# Patient Record
Sex: Male | Born: 1945
Health system: Southern US, Community
[De-identification: ages and names within clinical notes are randomized; demographics above are authoritative.]

## PROBLEM LIST (undated history)

## (undated) ENCOUNTER — Emergency Department (HOSPITAL_COMMUNITY): Payer: Medicare HMO | Source: Home / Self Care

## (undated) DIAGNOSIS — N138 Other obstructive and reflux uropathy: Secondary | ICD-10-CM

## (undated) DIAGNOSIS — I739 Peripheral vascular disease, unspecified: Secondary | ICD-10-CM

## (undated) DIAGNOSIS — K219 Gastro-esophageal reflux disease without esophagitis: Secondary | ICD-10-CM

## (undated) DIAGNOSIS — R0609 Other forms of dyspnea: Secondary | ICD-10-CM

## (undated) DIAGNOSIS — R634 Abnormal weight loss: Secondary | ICD-10-CM

## (undated) DIAGNOSIS — F17200 Nicotine dependence, unspecified, uncomplicated: Secondary | ICD-10-CM

## (undated) DIAGNOSIS — E7800 Pure hypercholesterolemia, unspecified: Secondary | ICD-10-CM

## (undated) DIAGNOSIS — J449 Chronic obstructive pulmonary disease, unspecified: Secondary | ICD-10-CM

## (undated) DIAGNOSIS — I10 Essential (primary) hypertension: Secondary | ICD-10-CM

## (undated) DIAGNOSIS — I77811 Abdominal aortic ectasia: Secondary | ICD-10-CM

## (undated) DIAGNOSIS — I2510 Atherosclerotic heart disease of native coronary artery without angina pectoris: Secondary | ICD-10-CM

## (undated) DIAGNOSIS — R053 Chronic cough: Secondary | ICD-10-CM

## (undated) DIAGNOSIS — R0600 Dyspnea, unspecified: Secondary | ICD-10-CM

## (undated) DIAGNOSIS — N401 Enlarged prostate with lower urinary tract symptoms: Secondary | ICD-10-CM

## (undated) DIAGNOSIS — R05 Cough: Secondary | ICD-10-CM

## (undated) HISTORY — DX: Other obstructive and reflux uropathy: N13.8

## (undated) HISTORY — DX: Peripheral vascular disease, unspecified: I73.9

## (undated) HISTORY — DX: Chronic cough: R05.3

## (undated) HISTORY — DX: Nicotine dependence, unspecified, uncomplicated: F17.200

## (undated) HISTORY — DX: Other forms of dyspnea: R06.09

## (undated) HISTORY — DX: Chronic obstructive pulmonary disease, unspecified: J44.9

## (undated) HISTORY — DX: Gastro-esophageal reflux disease without esophagitis: K21.9

## (undated) HISTORY — PX: CORONARY ANGIOPLASTY WITH STENT PLACEMENT: SHX49

## (undated) HISTORY — DX: Benign prostatic hyperplasia with lower urinary tract symptoms: N40.1

## (undated) HISTORY — DX: Dyspnea, unspecified: R06.00

## (undated) HISTORY — PX: FOOT SURGERY: SHX648

## (undated) HISTORY — DX: Pure hypercholesterolemia, unspecified: E78.00

## (undated) HISTORY — PX: ROTATOR CUFF REPAIR: SHX139

---
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
## 2009-07-30 DIAGNOSIS — N529 Male erectile dysfunction, unspecified: Secondary | ICD-10-CM | POA: Insufficient documentation

## 2009-07-30 HISTORY — DX: Male erectile dysfunction, unspecified: N52.9

## 2016-01-13 ENCOUNTER — Emergency Department (HOSPITAL_BASED_OUTPATIENT_CLINIC_OR_DEPARTMENT_OTHER): Payer: Medicare Other

## 2016-01-13 ENCOUNTER — Encounter (HOSPITAL_BASED_OUTPATIENT_CLINIC_OR_DEPARTMENT_OTHER): Payer: Self-pay | Admitting: Emergency Medicine

## 2016-01-13 ENCOUNTER — Emergency Department (HOSPITAL_BASED_OUTPATIENT_CLINIC_OR_DEPARTMENT_OTHER)
Admission: EM | Admit: 2016-01-13 | Discharge: 2016-01-13 | Disposition: A | Discharge status: Home / Self Care | Payer: Medicare Other | Attending: Emergency Medicine | Admitting: Emergency Medicine

## 2016-01-13 DIAGNOSIS — I2510 Atherosclerotic heart disease of native coronary artery without angina pectoris: Secondary | ICD-10-CM | POA: Diagnosis not present

## 2016-01-13 DIAGNOSIS — R51 Headache: Secondary | ICD-10-CM

## 2016-01-13 DIAGNOSIS — I10 Essential (primary) hypertension: Secondary | ICD-10-CM | POA: Diagnosis not present

## 2016-01-13 DIAGNOSIS — Z5181 Encounter for therapeutic drug level monitoring: Secondary | ICD-10-CM | POA: Insufficient documentation

## 2016-01-13 DIAGNOSIS — Z87891 Personal history of nicotine dependence: Secondary | ICD-10-CM | POA: Diagnosis not present

## 2016-01-13 DIAGNOSIS — R519 Headache, unspecified: Secondary | ICD-10-CM

## 2016-01-13 HISTORY — DX: Essential (primary) hypertension: I10

## 2016-01-13 HISTORY — DX: Atherosclerotic heart disease of native coronary artery without angina pectoris: I25.10

## 2016-01-13 LAB — DIFFERENTIAL
Basophils Absolute: 0 10*3/uL (ref 0.0–0.1)
Basophils Relative: 0 %
EOS PCT: 5 %
Eosinophils Absolute: 0.2 10*3/uL (ref 0.0–0.7)
LYMPHS PCT: 41 %
Lymphs Abs: 2.1 10*3/uL (ref 0.7–4.0)
MONO ABS: 0.5 10*3/uL (ref 0.1–1.0)
MONOS PCT: 10 %
NEUTROS ABS: 2.2 10*3/uL (ref 1.7–7.7)
Neutrophils Relative %: 44 %

## 2016-01-13 LAB — ETHANOL: ALCOHOL ETHYL (B): 66 mg/dL — AB (ref <5)

## 2016-01-13 LAB — PROTIME-INR
INR: 0.94
PROTHROMBIN TIME: 12.6 s (ref 11.4–15.2)

## 2016-01-13 LAB — CBG MONITORING, ED: Glucose-Capillary: 94 mg/dL (ref 65–99)

## 2016-01-13 LAB — CBC
HEMATOCRIT: 44.2 % (ref 39.0–52.0)
Hemoglobin: 15.1 g/dL (ref 13.0–17.0)
MCH: 28.3 pg (ref 26.0–34.0)
MCHC: 34.2 g/dL (ref 30.0–36.0)
MCV: 82.8 fL (ref 78.0–100.0)
PLATELETS: 231 10*3/uL (ref 150–400)
RBC: 5.34 MIL/uL (ref 4.22–5.81)
RDW: 14.9 % (ref 11.5–15.5)
WBC: 5 10*3/uL (ref 4.0–10.5)

## 2016-01-13 LAB — URINALYSIS, ROUTINE W REFLEX MICROSCOPIC
Bilirubin Urine: NEGATIVE
GLUCOSE, UA: NEGATIVE mg/dL
HGB URINE DIPSTICK: NEGATIVE
Ketones, ur: NEGATIVE mg/dL
LEUKOCYTES UA: NEGATIVE
Nitrite: NEGATIVE
PH: 5.5 (ref 5.0–8.0)
PROTEIN: NEGATIVE mg/dL
SPECIFIC GRAVITY, URINE: 1.016 (ref 1.005–1.030)

## 2016-01-13 LAB — RAPID URINE DRUG SCREEN, HOSP PERFORMED
Amphetamines: NOT DETECTED
BENZODIAZEPINES: NOT DETECTED
Barbiturates: NOT DETECTED
COCAINE: NOT DETECTED
OPIATES: NOT DETECTED
Tetrahydrocannabinol: NOT DETECTED

## 2016-01-13 LAB — APTT: aPTT: 39 seconds — ABNORMAL HIGH (ref 24–36)

## 2016-01-13 LAB — COMPREHENSIVE METABOLIC PANEL
ALK PHOS: 41 U/L (ref 38–126)
ALT: 13 U/L — AB (ref 17–63)
ANION GAP: 9 (ref 5–15)
AST: 17 U/L (ref 15–41)
Albumin: 4 g/dL (ref 3.5–5.0)
BUN: 19 mg/dL (ref 6–20)
CALCIUM: 8.8 mg/dL — AB (ref 8.9–10.3)
CO2: 22 mmol/L (ref 22–32)
CREATININE: 1.23 mg/dL (ref 0.61–1.24)
Chloride: 107 mmol/L (ref 101–111)
GFR, EST NON AFRICAN AMERICAN: 58 mL/min — AB (ref >60)
Glucose, Bld: 96 mg/dL (ref 65–99)
Potassium: 3.9 mmol/L (ref 3.5–5.1)
SODIUM: 138 mmol/L (ref 135–145)
TOTAL PROTEIN: 6.9 g/dL (ref 6.5–8.1)
Total Bilirubin: 0.4 mg/dL (ref 0.3–1.2)

## 2016-01-13 LAB — TROPONIN I: Troponin I: <0.03 ng/mL (ref <0.03)

## 2016-01-13 MED ORDER — METOCLOPRAMIDE HCL 5 MG/ML IJ SOLN
10.0000 mg | Freq: Once | INTRAMUSCULAR | Status: AC
  Administered 2016-01-13: 10 mg via INTRAVENOUS
  Filled 2016-01-13: qty 2

## 2016-01-13 MED ORDER — DIPHENHYDRAMINE HCL 50 MG/ML IJ SOLN
25.0000 mg | Freq: Once | INTRAMUSCULAR | Status: AC
Start: 2016-01-13 — End: 2016-01-13
  Administered 2016-01-13: 25 mg via INTRAVENOUS
  Filled 2016-01-13: qty 1

## 2016-01-13 MED ORDER — HYDROCHLOROTHIAZIDE 25 MG PO TABS: 25.0000 mg | ORAL_TABLET | Freq: Every day | ORAL | 0 refills | Status: DC

## 2016-01-13 NOTE — Discharge Instructions (Signed)
Return to the ED with any concerns including vomiting, changes in vision or speech, weakness of arms or legs, fainting, chest pain, difficulty breathing, decreased level of alertness/lethargy, or any other alarming symptoms

## 2016-01-13 NOTE — ED Notes (Signed)
Pt reports he drank 4 shots of liquor last night.

## 2016-01-13 NOTE — ED Triage Notes (Addendum)
Pt reports L arm pain and headache since he woke up this morning at 830. Pt denies chest pain. Endorses SOB. Pt states arm is weaker on the L side.

## 2016-01-13 NOTE — ED Provider Notes (Signed)
[facility] Provider Note   CSN: [id number] Arrival date & time: 01/13/16  1110     History   Chief Complaint Chief Complaint  Patient presents with  . Numbness  . Headache    HPI Shane Carrillo is a 70 y.o. male.  HPI  Pt presenting with c/o headache and left arm pain with numbness of left arm- he first noted symptoms when he awoke from sleep this morning.  Symptoms have been present for the past 3 hours.  No chest pain.  No changes in vision or speech.  No focal weakness- feels it is hard to move his left arm due to pain.  No swelling of arm.  No fever.  No head trauma.  Also c/o pain in left sided and posterior neck.  He states he did not have any symptoms when he went to bed last night. Has hx of hypertension but is not compliant with his medications. HA is constant and is gradually worsening since it began.    There are no other associated systemic symptoms, there are no other alleviating or modifying factors.   Past Medical History:  Diagnosis Date  . Coronary artery disease   . Hypertension     There are no active problems to display for this patient.   Past Surgical History:  Procedure Laterality Date  . CORONARY ANGIOPLASTY WITH STENT PLACEMENT         Home Medications    Prior to Admission medications   Medication Sig Start Date End Date Taking? Authorizing Provider  hydrochlorothiazide (HYDRODIURIL) 25 MG tablet Take 1 tablet (25 mg total) by mouth daily. 01/13/16   Alfonzo Beers, MD    Family History No family history on file.  Social History Social History  Substance Use Topics  . Smoking status: Former Research scientist (life sciences)  . Smokeless tobacco: Never Used  . Alcohol use No     Allergies   Aspirin   Review of Systems Review of Systems  ROS reviewed and all otherwise negative except for mentioned in HPI   Physical Exam Updated Vital Signs BP (!) 183/104   Pulse 71   Temp 97.7 F (36.5 C)   Resp 16   Ht 5\' 5"  (1.651 m)   Wt 150  lb (68 kg)   SpO2 100%   BMI 24.96 kg/m  Vitals reviewed Physical Exam Physical Examination: General appearance - alert, well appearing, and in no distress Mental status - alert, oriented to person, place, and time Eyes - pupils equal and reactive, extraocular eye movements intact Neck - supple, no significant adenopathy Chest - clear to auscultation, no wheezes, rales or rhonchi, symmetric air entry Heart - normal rate, regular rhythm, normal S1, S2, no murmurs, rubs, clicks or gallops Abdomen - soft, nontender, nondistended, no masses or organomegaly Neurological - alert, oriented x 3, normal speech, cranial nerves 2-12 tested and intact, strength 5/5 in extremities x 4, no pronator drift.  Pt states he has pain in left forearm which somewhat limits his effort, but overall strength is symmetric Musculoskeletal - no joint tenderness, deformity or swelling Extremities - peripheral pulses normal, no pedal edema, no clubbing or cyanosis Skin - normal coloration and turgor, no rashes  ED Treatments / Results  Labs (all labs ordered are listed, but only abnormal results are displayed) Labs Reviewed  ETHANOL - Abnormal; Notable for the following:       Result Value   Alcohol, Ethyl (B) 66 (*)    All other components within  normal limits  APTT - Abnormal; Notable for the following:    aPTT 39 (*)    All other components within normal limits  COMPREHENSIVE METABOLIC PANEL - Abnormal; Notable for the following:    Calcium 8.8 (*)    ALT 13 (*)    GFR calc non Af Amer 58 (*)    All other components within normal limits  PROTIME-INR  CBC  DIFFERENTIAL  RAPID URINE DRUG SCREEN, HOSP PERFORMED  URINALYSIS, ROUTINE W REFLEX MICROSCOPIC (NOT AT [facility])  TROPONIN I  CBG MONITORING, ED    EKG  EKG Interpretation  Date/Time:  Sunday January 13 2016 11:26:32 EST Ventricular Rate:  68 PR Interval:  150 QRS Duration: 78 QT Interval:  378 QTC Calculation: 401 R Axis:   53 Text  Interpretation:  Normal sinus rhythm Minimal voltage criteria for LVH, may be normal variant Septal infarct , age undetermined Abnormal ECG No old tracing to compare Confirmed by [facility]  MD, Malaisha Silliman [phone]) on 01/13/2016 11:49:14 AM Also confirmed by Canary Brim  MD, [place] [phone]), editor [place], Joelene Millin [phone])  on 01/13/2016 12:45:04 PM       Radiology Ct Head Wo Contrast  Result Date: 01/13/2016 CLINICAL DATA:  Patient with left arm pain. Headache since the waking this morning. Shortness of breath. EXAM: CT HEAD WITHOUT CONTRAST TECHNIQUE: Contiguous axial images were obtained from the base of the skull through the vertex without intravenous contrast. COMPARISON:  None. FINDINGS: Brain: Ventricles and sulci are appropriate for patient's age. Periventricular and subcortical white matter hypodensity compatible with chronic microvascular ischemic changes. No definite acute large territory infarct, intracranial hemorrhage, mass lesion or mass-effect. Vascular: Unremarkable. Skull: Intact. Sinuses/Orbits: Mucosal thickening involving the left and right maxillary sinuses. Mastoid air cells are unremarkable. Orbits are unremarkable. Other: None. IMPRESSION: No acute intracranial process. Chronic microvascular ischemic changes. Maxillary sinus mucosal thickening. Electronically Signed   By: Lovey Newcomer M.D.   On: 01/13/2016 12:02    Procedures Procedures (including critical care time)  Medications Ordered in ED Medications  metoCLOPramide (REGLAN) injection 10 mg (10 mg Intravenous Given 01/13/16 1157)  diphenhydrAMINE (BENADRYL) injection 25 mg (25 mg Intravenous Given 01/13/16 1157)     Initial Impression / Assessment and Plan / ED Course  I have reviewed the triage vital signs and the nursing notes.  Pertinent labs & imaging results that were available during my care of the patient were reviewed by me and considered in my medical decision making (see chart for details).  Clinical Course     Pt  presenting with headache. He also has hypertension- he is not currently taking his BP meds.  Neurologic exam is reassuring- he has some pain and tenderness over left forearm- no weakness on exam.  Head CT is reassuring.  Pt has increased etoh level- endorses drinking last night.  Symptoms of headache may be worsened by etoh intake last night.  Pt started on HCTZ for BP control.  Given information for PMD in this area as he states he recently moved here.  Prior records from [place]/[facility] reviewed in EPIC.  Discharged with strict return precautions.  Pt agreeable with plan.  Final Clinical Impressions(s) / ED Diagnoses   Final diagnoses:  Essential hypertension  Bad headache    New Prescriptions Discharge Medication List as of 01/13/2016  2:04 PM    START taking these medications   Details  hydrochlorothiazide (HYDRODIURIL) 25 MG tablet Take 1 tablet (25 mg total) by mouth daily., Starting Sun 01/13/2016, Print  Alfonzo Beers, MD 01/13/16 [phone]

## 2016-01-21 ENCOUNTER — Encounter (HOSPITAL_COMMUNITY): Payer: Self-pay | Admitting: *Deleted

## 2016-01-21 ENCOUNTER — Emergency Department (HOSPITAL_COMMUNITY)
Admission: EM | Admit: 2016-01-21 | Discharge: 2016-01-21 | Disposition: A | Discharge status: Home / Self Care | Payer: Medicare Other | Attending: Emergency Medicine | Admitting: Emergency Medicine

## 2016-01-21 DIAGNOSIS — F17200 Nicotine dependence, unspecified, uncomplicated: Secondary | ICD-10-CM | POA: Insufficient documentation

## 2016-01-21 DIAGNOSIS — Z955 Presence of coronary angioplasty implant and graft: Secondary | ICD-10-CM | POA: Insufficient documentation

## 2016-01-21 DIAGNOSIS — R51 Headache: Secondary | ICD-10-CM | POA: Insufficient documentation

## 2016-01-21 DIAGNOSIS — I2510 Atherosclerotic heart disease of native coronary artery without angina pectoris: Secondary | ICD-10-CM | POA: Diagnosis not present

## 2016-01-21 DIAGNOSIS — Z7982 Long term (current) use of aspirin: Secondary | ICD-10-CM | POA: Insufficient documentation

## 2016-01-21 DIAGNOSIS — R519 Headache, unspecified: Secondary | ICD-10-CM

## 2016-01-21 LAB — CBC WITH DIFFERENTIAL/PLATELET
BASOS ABS: 0 10*3/uL (ref 0.0–0.1)
BASOS PCT: 0 %
EOS ABS: 0.2 10*3/uL (ref 0.0–0.7)
EOS PCT: 4 %
HCT: 43.4 % (ref 39.0–52.0)
Hemoglobin: 14.3 g/dL (ref 13.0–17.0)
Lymphocytes Relative: 44 %
Lymphs Abs: 2.6 10*3/uL (ref 0.7–4.0)
MCH: 28.2 pg (ref 26.0–34.0)
MCHC: 32.9 g/dL (ref 30.0–36.0)
MCV: 85.6 fL (ref 78.0–100.0)
MONO ABS: 0.7 10*3/uL (ref 0.1–1.0)
MONOS PCT: 13 %
NEUTROS ABS: 2.2 10*3/uL (ref 1.7–7.7)
Neutrophils Relative %: 39 %
PLATELETS: 244 10*3/uL (ref 150–400)
RBC: 5.07 MIL/uL (ref 4.22–5.81)
RDW: 15.2 % (ref 11.5–15.5)
WBC: 5.8 10*3/uL (ref 4.0–10.5)

## 2016-01-21 LAB — COMPREHENSIVE METABOLIC PANEL
ALBUMIN: 3.8 g/dL (ref 3.5–5.0)
ALT: 18 U/L (ref 17–63)
ANION GAP: 7 (ref 5–15)
AST: 23 U/L (ref 15–41)
Alkaline Phosphatase: 43 U/L (ref 38–126)
BILIRUBIN TOTAL: 0.5 mg/dL (ref 0.3–1.2)
BUN: 25 mg/dL — AB (ref 6–20)
CHLORIDE: 104 mmol/L (ref 101–111)
CO2: 24 mmol/L (ref 22–32)
Calcium: 8.8 mg/dL — ABNORMAL LOW (ref 8.9–10.3)
Creatinine, Ser: 1.03 mg/dL (ref 0.61–1.24)
GFR calc Af Amer: >60 mL/min (ref >60)
GFR calc non Af Amer: >60 mL/min (ref >60)
GLUCOSE: 93 mg/dL (ref 65–99)
POTASSIUM: 4.4 mmol/L (ref 3.5–5.1)
SODIUM: 135 mmol/L (ref 135–145)
TOTAL PROTEIN: 6.3 g/dL — AB (ref 6.5–8.1)

## 2016-01-21 LAB — URINALYSIS, ROUTINE W REFLEX MICROSCOPIC
Bilirubin Urine: NEGATIVE
GLUCOSE, UA: NEGATIVE mg/dL
Hgb urine dipstick: NEGATIVE
Ketones, ur: NEGATIVE mg/dL
LEUKOCYTES UA: NEGATIVE
Nitrite: NEGATIVE
PROTEIN: NEGATIVE mg/dL
SPECIFIC GRAVITY, URINE: 1.018 (ref 1.005–1.030)
pH: 5 (ref 5.0–8.0)

## 2016-01-21 MED ORDER — DIPHENHYDRAMINE HCL 50 MG/ML IJ SOLN
25.0000 mg | Freq: Once | INTRAMUSCULAR | Status: AC
  Administered 2016-01-21: 25 mg via INTRAVENOUS
  Filled 2016-01-21: qty 1

## 2016-01-21 MED ORDER — PROCHLORPERAZINE EDISYLATE 5 MG/ML IJ SOLN
10.0000 mg | Freq: Once | INTRAMUSCULAR | Status: AC
  Administered 2016-01-21: 10 mg via INTRAVENOUS
  Filled 2016-01-21: qty 2

## 2016-01-21 NOTE — Discharge Instructions (Signed)
You may take tylenol or Excedrin extra strength for headache   Stay well hydrated, drink plenty of fluids  It is very important that you take all your prescribed medications daily, especially your blood pressure medication  Call and make an appointment with [facility] clinic to establish care with a primary care provider  Return to ED if you experience severe, sudden, new headache with nausea, vomiting, changes in vision or extremity weakness or numbness

## 2016-01-21 NOTE — ED Provider Notes (Signed)
[facility] DEPT Provider Note   CSN: [id number] Arrival date & time: 01/21/16  [id number]  History   Chief Complaint Chief Complaint  Patient presents with  . Headache    HPI Shane Carrillo is a 70 y.o. male with pmh of CAD and HTN presents with 8/10 sharp pulsating headache located at posterior aspect of head that started 1.5 hr ago associated with bilateral neck pain.  Pt woke up and then noticed headache, pain did not wake him up from sleep.  Headache is pulsating and gradually decreasing in severity with time.  Pt states he has a remote history of "bad headaches" that occur in the same place and quality although today's headache is more severe.  Pt also reports numbness at tip of all fingers.  Pt states he has not taken his blood pressure medications in a while.  Pt last seen in ED for similar headache a week ago (01/13/16), pt reports he was given prescription for HCTZ and referral for PCP however pt states he did not refill his prescriptions and has not been taking HCTZ, he has also not followed up with PCP since ED visit.  Pt has not tried any OTC pain relieve medications.  Pt has h/o of tobacco use, attempted to quit last week but used again 2 days ago.  No changes in caffeine intake.  No h/o illicit drug use.   Pt denies recent head/neck trauma, fevers, neck stiffness, photophobia, chest pain, shortness breath, blurry vision, difficulty or changes in speech. No nausea, vomiting.   No focal weakness.  No previous h/o stroke.    HPI  Past Medical History:  Diagnosis Date  . Coronary artery disease   . Hypertension     There are no active problems to display for this patient.   Past Surgical History:  Procedure Laterality Date  . CORONARY ANGIOPLASTY WITH STENT PLACEMENT         Home Medications    Prior to Admission medications   Medication Sig Start Date End Date Taking? Authorizing Provider  albuterol (PROVENTIL HFA;VENTOLIN HFA) 108 (90 Base) MCG/ACT inhaler Inhale 2  puffs into the lungs every 6 (six) hours as needed for wheezing or shortness of breath.   Yes Historical Provider, MD  aspirin 325 MG tablet Take 325 mg by mouth daily.   Yes Historical Provider, MD  cetirizine (ZYRTEC) 10 MG tablet Take 10 mg by mouth daily as needed for allergies.   Yes Historical Provider, MD  clopidogrel (PLAVIX) 75 MG tablet Take 75 mg by mouth at bedtime.   Yes Historical Provider, MD  diazepam (VALIUM) 2 MG tablet Take 2 mg by mouth every 6 (six) hours as needed for muscle spasms.   Yes Historical Provider, MD  dutasteride (AVODART) 0.5 MG capsule Take 0.5 mg by mouth daily.   Yes Historical Provider, MD  famotidine (PEPCID) 40 MG tablet Take 40 mg by mouth at bedtime.   Yes Historical Provider, MD  fluticasone (FLONASE) 50 MCG/ACT nasal spray Place 2 sprays into both nostrils daily as needed for rhinitis.   Yes Historical Provider, MD  gabapentin (NEURONTIN) 300 MG capsule Take 300 mg by mouth 3 (three) times daily.   Yes Historical Provider, MD  hydrochlorothiazide (HYDRODIURIL) 25 MG tablet Take 1 tablet (25 mg total) by mouth daily. 01/13/16  Yes Alfonzo Beers, MD  isosorbide mononitrate (IMDUR) 60 MG 24 hr tablet Take 60 mg by mouth daily.   Yes Historical Provider, MD  lisinopril (PRINIVIL,ZESTRIL) 20 MG tablet Take  20 mg by mouth daily.   Yes Historical Provider, MD  metoprolol tartrate (LOPRESSOR) 25 MG tablet Take 25 mg by mouth 2 (two) times daily.   Yes Historical Provider, MD  nitroGLYCERIN (NITROSTAT) 0.4 MG SL tablet Place 0.4 mg under the tongue every 5 (five) minutes as needed for chest pain.   Yes Historical Provider, MD  omega-3 acid ethyl esters (LOVAZA) 1 g capsule Take 1 g by mouth daily.   Yes Historical Provider, MD  ranolazine (RANEXA) 1000 MG SR tablet Take 1,000 mg by mouth 2 (two) times daily.   Yes Historical Provider, MD  rosuvastatin (CRESTOR) 20 MG tablet Take 20 mg by mouth at bedtime.   Yes Historical Provider, MD    Family History No  family history on file.  Social History Social History  Substance Use Topics  . Smoking status: Current Some Day Smoker  . Smokeless tobacco: Never Used  . Alcohol use Yes     Allergies   Aspirin   Review of Systems Review of Systems  All other systems reviewed and are negative.    Physical Exam Updated Vital Signs BP (!) 148/103 (BP Location: Left Arm)   Pulse (!) 59   Temp 97.6 F (36.4 C) (Oral)   Resp 18   SpO2 100%   Physical Exam  Constitutional: He appears well-developed and well-nourished. He is sleeping.  Non-toxic appearance. No distress.  Pt found asleep on bed prior to encounter, easily arousable.  HENT:  Head: Normocephalic and atraumatic.  Right Ear: Tympanic membrane normal.  Left Ear: Tympanic membrane normal.  Nose: Nose normal.  Mouth/Throat: Oropharynx is clear and moist. No oropharyngeal exudate.  Eyes: Conjunctivae, EOM and lids are normal. Pupils are equal, round, and reactive to light.  Neck: Trachea normal. No JVD present. Muscular tenderness (paraspinal cervical muscle tenderness with increase muscular tone, bilaterally) present. No spinous process tenderness present. Carotid bruit is not present. No neck rigidity. Normal range of motion present. No Brudzinski's sign and no Kernig's sign noted. No thyroid mass present.  Cardiovascular: Normal rate, regular rhythm, normal heart sounds and intact distal pulses.   No murmur heard. Pulmonary/Chest: Effort normal and breath sounds normal. No tachypnea. No respiratory distress.  Abdominal: Soft. Bowel sounds are normal. He exhibits no distension. There is no tenderness.  Musculoskeletal: Normal range of motion.  Neurological:  Pt is alert and oriented.  Speech normal.  Thought process coherent.  Strength 5/5 in upper and lower extremities.  Sensation to light touch intact in upper and lower extremities. Intact finger to nose test.  CN I not tested CN II full visual fields bilaterally CN III, IV, VI  PEERL and EOM intact bilaterally  CN V sensation to light touch intact in all 3 divisions of trigeminal nerve  CN VII facial nerve motor function intact and symmetric CN VIII hearing intact to finger rub bilaterally  CN IX, X no uvula deviation, symmetric soft palate rise CN XI 5/5 SCM and trapezius strength bilaterally  CN XII Tongue midline with symmetric L/R movement  Skin: Skin is warm. Capillary refill takes less than 2 seconds.     ED Treatments / Results  Labs (all labs ordered are listed, but only abnormal results are displayed) Labs Reviewed  COMPREHENSIVE METABOLIC PANEL - Abnormal; Notable for the following:       Result Value   BUN 25 (*)    Calcium 8.8 (*)    Total Protein 6.3 (*)    All other components within  normal limits  CBC WITH DIFFERENTIAL/PLATELET  URINALYSIS, ROUTINE W REFLEX MICROSCOPIC (NOT AT [facility])    EKG  EKG Interpretation None       Radiology No results found.  Procedures Procedures (including critical care time)  Medications Ordered in ED Medications  prochlorperazine (COMPAZINE) injection 10 mg (10 mg Intravenous Given 01/21/16 0753)  diphenhydrAMINE (BENADRYL) injection 25 mg (25 mg Intravenous Given 01/21/16 0752)     Initial Impression / Assessment and Plan / ED Course  I have reviewed the triage vital signs and the nursing notes.  Pertinent labs & imaging results that were available during my care of the patient were reviewed by me and considered in my medical decision making (see chart for details).  Clinical Course    70 yo male with h/o CAD with stent, poorly controlled HTN due to non compliance, remote history of headaches presents today with occipital headache.  Pt last seen in ED for similar headache a week ago (01/13/16).  At that time pt had reassuring neuro exam and CT head was negative for acute abnormalities.  Pt was treated with reglan/benedryl and discharged with HCTZ for blood pressure control and instructed to  establish care with PCP.  Pt has not taken HCTZ as prescribed at last ED discharge and he has also not followed up with PCP.  Pt reports headache today is similar in character and location as previous headaches, however it is more severe in pain which is why he came to ED. On exam today pt is hypertensive with SBP in the 160s, neuro exam intact, without facial droop, speech impediment or arm drift.  No papilledema.  Pt reports paraspinal neck tenderness, noted to have increased muscular tone with full neck ROM. Pt given compazine + benedryl x 1 in ED, which decreased headache.  Patient is without high-risk features of headache including: sudden onset/thunderclap HA, new headache or no similar headache in past, altered mental status, accompanying seizure, headache with exertion, fever, use of anticoagulation.  Patient has a normal complete neurological exam, normal level of consciousness, no signs of meningismus, is well-appearing/non-toxic appearing, no signs of trauma. No papilledema, no pain over the temporal arteries.  Given previous h/o of similar headaches, current headache progressively improving and responsive to ED treatment, intact neuro exam today and recent CT head negative pt is unlikely to have underlying SAH, TIA/stroke, meningitis.  Imaging with CT/MRI not indicated given history and physical exam findings, and recent benign CT head. No dangerous or life-threatening conditions suspected or identified by history, physical exam, and by work-up. No indications for hospitalization identified.  Pt discharged and instructed to follow up with primary care provider, given additional PCP information [facility] information), encouraged to take bp medications.  Discussed strict ED return precautions, pt verbalized understanding.   Final Clinical Impressions(s) / ED Diagnoses   Final diagnoses:  Intractable headache, unspecified chronicity pattern, unspecified headache type    New  Prescriptions Discharge Medication List as of 01/21/2016 10:00 AM       Kinnie Feil, PA-C 01/21/16 2123    Kinnie Feil, PA-C 01/21/16 2126    Kinnie Feil, PA-C 01/21/16 2128    Deno Etienne, DO 01/22/16 [phone]

## 2016-01-21 NOTE — ED Triage Notes (Signed)
Pt reports "splitting headache" that started earlier this morning. Denies any other symptoms.

## 2016-02-14 ENCOUNTER — Emergency Department (HOSPITAL_COMMUNITY)
Admission: EM | Admit: 2016-02-14 | Discharge: 2016-02-14 | Disposition: A | Discharge status: Home / Self Care | Payer: Medicare Other | Attending: Emergency Medicine | Admitting: Emergency Medicine

## 2016-02-14 ENCOUNTER — Encounter (HOSPITAL_COMMUNITY): Payer: Self-pay | Admitting: Emergency Medicine

## 2016-02-14 ENCOUNTER — Emergency Department (HOSPITAL_COMMUNITY): Payer: Medicare Other

## 2016-02-14 DIAGNOSIS — J069 Acute upper respiratory infection, unspecified: Secondary | ICD-10-CM | POA: Diagnosis not present

## 2016-02-14 DIAGNOSIS — I2510 Atherosclerotic heart disease of native coronary artery without angina pectoris: Secondary | ICD-10-CM | POA: Insufficient documentation

## 2016-02-14 DIAGNOSIS — I10 Essential (primary) hypertension: Secondary | ICD-10-CM | POA: Insufficient documentation

## 2016-02-14 DIAGNOSIS — J029 Acute pharyngitis, unspecified: Secondary | ICD-10-CM | POA: Diagnosis present

## 2016-02-14 DIAGNOSIS — Z955 Presence of coronary angioplasty implant and graft: Secondary | ICD-10-CM | POA: Diagnosis not present

## 2016-02-14 DIAGNOSIS — B9789 Other viral agents as the cause of diseases classified elsewhere: Secondary | ICD-10-CM

## 2016-02-14 DIAGNOSIS — H9202 Otalgia, left ear: Secondary | ICD-10-CM | POA: Insufficient documentation

## 2016-02-14 DIAGNOSIS — Z7982 Long term (current) use of aspirin: Secondary | ICD-10-CM | POA: Insufficient documentation

## 2016-02-14 DIAGNOSIS — F17200 Nicotine dependence, unspecified, uncomplicated: Secondary | ICD-10-CM | POA: Diagnosis not present

## 2016-02-14 DIAGNOSIS — Z79899 Other long term (current) drug therapy: Secondary | ICD-10-CM | POA: Insufficient documentation

## 2016-02-14 LAB — RAPID STREP SCREEN (MED CTR MEBANE ONLY): Streptococcus, Group A Screen (Direct): NEGATIVE

## 2016-02-14 MED ORDER — BENZONATATE 100 MG PO CAPS
100.0000 mg | ORAL_CAPSULE | Freq: Once | ORAL | Status: AC
  Administered 2016-02-14: 100 mg via ORAL
  Filled 2016-02-14: qty 1

## 2016-02-14 MED ORDER — FLUTICASONE PROPIONATE 50 MCG/ACT NA SUSP: 2.0000 | Freq: Every day | NASAL | 0 refills | Status: DC

## 2016-02-14 MED ORDER — DEXAMETHASONE SODIUM PHOSPHATE 10 MG/ML IJ SOLN
10.0000 mg | Freq: Once | INTRAMUSCULAR | Status: AC
  Administered 2016-02-14: 10 mg via INTRAMUSCULAR
  Filled 2016-02-14: qty 1

## 2016-02-14 MED ORDER — BENZONATATE 100 MG PO CAPS: 100.0000 mg | ORAL_CAPSULE | Freq: Three times a day (TID) | ORAL | 0 refills | Status: DC | PRN

## 2016-02-14 MED ORDER — IPRATROPIUM-ALBUTEROL 0.5-2.5 (3) MG/3ML IN SOLN
3.0000 mL | Freq: Once | RESPIRATORY_TRACT | Status: AC
  Administered 2016-02-14: 3 mL via RESPIRATORY_TRACT
  Filled 2016-02-14: qty 3

## 2016-02-14 MED ORDER — AMOXICILLIN 250 MG PO CAPS: 250.0000 mg | ORAL_CAPSULE | Freq: Three times a day (TID) | ORAL | 0 refills | Status: DC

## 2016-02-14 NOTE — ED Notes (Signed)
Patient transported to X-ray 

## 2016-02-14 NOTE — ED Triage Notes (Signed)
Pt. reports sore throat with occasional dry cough and left ear discomfort onset Sunday .

## 2016-02-14 NOTE — Discharge Instructions (Signed)
Please take the antibiotics as prescribed for your left ear pain. You may also take the tessalon as prescribed for cough. You also need to get mucinex over the counter for congestion. You may use the flonase in each nostril for the rhinorrhea. Follow up with a primary care doctor in regards to today's visit. Return to the ED if your symptoms worsen. Use may also use nasal saline washes to rinse nose.

## 2016-02-15 NOTE — ED Provider Notes (Signed)
[place] [facility] Provider Note   CSN: [id number] Arrival date & time: 02/14/16  0001     History   Chief Complaint Chief Complaint  Patient presents with  . Sore Throat    HPI Shane Carrillo is a 70 y.o. male.   Sore Throat  This is a new problem. The current episode started more than 2 days ago. The problem occurs constantly. The problem has not changed since onset.Pertinent negatives include no chest pain, no abdominal pain, no headaches and no shortness of breath. The symptoms are aggravated by swallowing. Nothing relieves the symptoms. He has tried nothing for the symptoms.  Otalgia  This is a new problem. The current episode started more than 2 days ago. There is pain in the left ear. The problem occurs constantly. The problem has not changed since onset.There has been no fever. The pain is at a severity of 4/10. The pain is mild. Associated symptoms include rhinorrhea, sore throat and cough. Pertinent negatives include no ear discharge, no headaches, no hearing loss, no abdominal pain, no diarrhea and no vomiting. His past medical history does not include chronic ear infection or hearing loss.  URI   This is a new problem. The current episode started more than 2 days ago. The problem has not changed since onset.There has been no fever. Associated symptoms include congestion, ear pain, rhinorrhea, sore throat and cough. Pertinent negatives include no chest pain, no abdominal pain, no diarrhea, no vomiting, no headaches and no sinus pain. He has tried nothing for the symptoms. The treatment provided no relief.   Patient is a multi pack year smoker.  Past Medical History:  Diagnosis Date  . Coronary artery disease   . Hypertension     There are no active problems to display for this patient.   Past Surgical History:  Procedure Laterality Date  . CORONARY ANGIOPLASTY WITH STENT PLACEMENT         Home Medications    Prior to Admission medications     Medication Sig Start Date End Date Taking? Authorizing Provider  albuterol (PROVENTIL HFA;VENTOLIN HFA) 108 (90 Base) MCG/ACT inhaler Inhale 2 puffs into the lungs every 6 (six) hours as needed for wheezing or shortness of breath.    Historical Provider, MD  amoxicillin (AMOXIL) 250 MG capsule Take 1 capsule (250 mg total) by mouth 3 (three) times daily. 02/14/16   Doristine Devoid, PA-C  aspirin 325 MG tablet Take 325 mg by mouth daily.    Historical Provider, MD  benzonatate (TESSALON) 100 MG capsule Take 1 capsule (100 mg total) by mouth 3 (three) times daily as needed for cough. 02/14/16   Doristine Devoid, PA-C  cetirizine (ZYRTEC) 10 MG tablet Take 10 mg by mouth daily as needed for allergies.    Historical Provider, MD  clopidogrel (PLAVIX) 75 MG tablet Take 75 mg by mouth at bedtime.    Historical Provider, MD  diazepam (VALIUM) 2 MG tablet Take 2 mg by mouth every 6 (six) hours as needed for muscle spasms.    Historical Provider, MD  dutasteride (AVODART) 0.5 MG capsule Take 0.5 mg by mouth daily.    Historical Provider, MD  famotidine (PEPCID) 40 MG tablet Take 40 mg by mouth at bedtime.    Historical Provider, MD  fluticasone (FLONASE) 50 MCG/ACT nasal spray Place 2 sprays into both nostrils daily. 02/14/16   Doristine Devoid, PA-C  gabapentin (NEURONTIN) 300 MG capsule Take 300 mg by mouth 3 (three) times  daily.    Historical Provider, MD  hydrochlorothiazide (HYDRODIURIL) 25 MG tablet Take 1 tablet (25 mg total) by mouth daily. 01/13/16   Alfonzo Beers, MD  isosorbide mononitrate (IMDUR) 60 MG 24 hr tablet Take 60 mg by mouth daily.    Historical Provider, MD  lisinopril (PRINIVIL,ZESTRIL) 20 MG tablet Take 20 mg by mouth daily.    Historical Provider, MD  metoprolol tartrate (LOPRESSOR) 25 MG tablet Take 25 mg by mouth 2 (two) times daily.    Historical Provider, MD  nitroGLYCERIN (NITROSTAT) 0.4 MG SL tablet Place 0.4 mg under the tongue every 5 (five) minutes as needed for  chest pain.    Historical Provider, MD  omega-3 acid ethyl esters (LOVAZA) 1 g capsule Take 1 g by mouth daily.    Historical Provider, MD  ranolazine (RANEXA) 1000 MG SR tablet Take 1,000 mg by mouth 2 (two) times daily.    Historical Provider, MD  rosuvastatin (CRESTOR) 20 MG tablet Take 20 mg by mouth at bedtime.    Historical Provider, MD    Family History No family history on file.  Social History Social History  Substance Use Topics  . Smoking status: Current Some Day Smoker  . Smokeless tobacco: Never Used  . Alcohol use Yes     Allergies   Aspirin   Review of Systems Review of Systems  Constitutional: Negative for chills and fever.  HENT: Positive for congestion, ear pain, rhinorrhea and sore throat. Negative for ear discharge, hearing loss and sinus pain.   Eyes: Negative for discharge and redness.  Respiratory: Positive for cough. Negative for shortness of breath.   Cardiovascular: Negative for chest pain.  Gastrointestinal: Negative for abdominal pain, diarrhea and vomiting.  Neurological: Negative for headaches.     Physical Exam Updated Vital Signs BP 158/91 (BP Location: Right Arm)   Pulse 79   Temp 98 F (36.7 C) (Oral)   Resp 19   Ht 5\' 5"  (1.651 m)   Wt 71 kg   SpO2 100%   BMI 26.03 kg/m   Physical Exam  Constitutional: He appears well-developed and well-nourished. No distress.  HENT:  Head: Normocephalic and atraumatic.  Right Ear: Hearing, tympanic membrane, external ear and ear canal normal.  Left Ear: Hearing, external ear and ear canal normal. A middle ear effusion is present.  Nose: Mucosal edema and rhinorrhea present. Right sinus exhibits no maxillary sinus tenderness and no frontal sinus tenderness. Left sinus exhibits no maxillary sinus tenderness and no frontal sinus tenderness.  Mouth/Throat: Uvula is midline and mucous membranes are normal. Posterior oropharyngeal erythema present. No oropharyngeal exudate, posterior oropharyngeal  edema or tonsillar abscesses. Tonsils are 1+ on the right. Tonsils are 1+ on the left. No tonsillar exudate.  No mastoid erythema, edema or ttp.  Eyes: Conjunctivae are normal. Pupils are equal, round, and reactive to light. Right eye exhibits no discharge. Left eye exhibits no discharge. No scleral icterus.  Neck: Normal range of motion. Neck supple.  Pulmonary/Chest: Effort normal. No respiratory distress. He has wheezes. He has no rhonchi. He has no rales.  Expiratory wheezes noted throughout all lung bases. Patient is not hypoxic or tachypnea.  Musculoskeletal: Normal range of motion.  Lymphadenopathy:    He has no cervical adenopathy.  Neurological: He is alert.  Skin: Skin is warm and dry. Capillary refill takes less than 2 seconds. No pallor.  Nursing note and vitals reviewed.    ED Treatments / Results  Labs (all labs ordered are listed,  but only abnormal results are displayed) Labs Reviewed  RAPID STREP SCREEN (NOT AT [facility])  CULTURE, GROUP A STREP [facility])    EKG  EKG Interpretation None       Radiology Dg Chest 2 View  Result Date: 02/14/2016 CLINICAL DATA:  Cough since Friday EXAM: CHEST  2 VIEW COMPARISON:  None. FINDINGS: Cardiomediastinal contours are normal. No focal airspace consolidation or pulmonary edema. No pleural effusion or pneumothorax. Left apical surgical clips. IMPRESSION: No active cardiopulmonary disease. Electronically Signed   By: Ulyses Jarred M.D.   On: 02/14/2016 01:04    Procedures Procedures (including critical care time)  Medications Ordered in ED Medications  dexamethasone (DECADRON) injection 10 mg (10 mg Intramuscular Given 02/14/16 0041)  benzonatate (TESSALON) capsule 100 mg (100 mg Oral Given 02/14/16 0041)  ipratropium-albuterol (DUONEB) 0.5-2.5 (3) MG/3ML nebulizer solution 3 mL (3 mLs Nebulization Given 02/14/16 0058)     Initial Impression / Assessment and Plan / ED Course  I have reviewed the triage vital signs and the  nursing notes.  Pertinent labs & imaging results that were available during my care of the patient were reviewed by me and considered in my medical decision making (see chart for details).  Clinical Course   Patient presents with otalgia and exam consistent with acute otitis media. No concern for acute mastoiditis, meningitis.  No antibiotic use in the last month.  Patient discharged home with Amoxicillin.  Pt CXR negative for acute infiltrate. Bilateral wheezes noted. Duo neb with sig improvement in wheezes. Patients symptoms are consistent with URI, likely viral etiology. Discussed that antibiotics are not indicated for viral infections. Pt will be discharged with symptomatic treatment.  Verbalizes understanding and is agreeable with plan. Pt is hemodynamically stable & in NAD prior to dc.      Final Clinical Impressions(s) / ED Diagnoses   Final diagnoses:  Viral URI with cough  Left ear pain    New Prescriptions Discharge Medication List as of 02/14/2016  1:24 AM    START taking these medications   Details  amoxicillin (AMOXIL) 250 MG capsule Take 1 capsule (250 mg total) by mouth 3 (three) times daily., Starting Thu 02/14/2016, Print    benzonatate (TESSALON) 100 MG capsule Take 1 capsule (100 mg total) by mouth 3 (three) times daily as needed for cough., Starting Thu 02/14/2016, Print         Doristine Devoid, PA-C 02/15/16 [id number]    Orpah Greek, MD 02/23/16 [id number]

## 2016-02-16 LAB — CULTURE, GROUP A STREP (THRC)

## 2016-02-18 ENCOUNTER — Encounter (HOSPITAL_COMMUNITY): Payer: Self-pay | Admitting: Emergency Medicine

## 2016-02-18 ENCOUNTER — Emergency Department (HOSPITAL_COMMUNITY): Payer: Medicare HMO

## 2016-02-18 ENCOUNTER — Emergency Department (HOSPITAL_COMMUNITY)
Admission: EM | Admit: 2016-02-18 | Discharge: 2016-02-18 | Disposition: A | Discharge status: Home / Self Care | Payer: Medicare HMO | Attending: Emergency Medicine | Admitting: Emergency Medicine

## 2016-02-18 DIAGNOSIS — F17200 Nicotine dependence, unspecified, uncomplicated: Secondary | ICD-10-CM | POA: Diagnosis not present

## 2016-02-18 DIAGNOSIS — Z79899 Other long term (current) drug therapy: Secondary | ICD-10-CM | POA: Insufficient documentation

## 2016-02-18 DIAGNOSIS — J189 Pneumonia, unspecified organism: Secondary | ICD-10-CM | POA: Diagnosis not present

## 2016-02-18 DIAGNOSIS — I2510 Atherosclerotic heart disease of native coronary artery without angina pectoris: Secondary | ICD-10-CM | POA: Insufficient documentation

## 2016-02-18 DIAGNOSIS — R05 Cough: Secondary | ICD-10-CM | POA: Diagnosis not present

## 2016-02-18 DIAGNOSIS — I10 Essential (primary) hypertension: Secondary | ICD-10-CM | POA: Insufficient documentation

## 2016-02-18 DIAGNOSIS — R0602 Shortness of breath: Secondary | ICD-10-CM | POA: Diagnosis present

## 2016-02-18 DIAGNOSIS — R69 Illness, unspecified: Secondary | ICD-10-CM | POA: Diagnosis not present

## 2016-02-18 MED ORDER — ACETAMINOPHEN 500 MG PO TABS
1000.0000 mg | ORAL_TABLET | Freq: Once | ORAL | Status: AC
  Administered 2016-02-18: 1000 mg via ORAL
  Filled 2016-02-18: qty 2

## 2016-02-18 MED ORDER — ALBUTEROL (5 MG/ML) CONTINUOUS INHALATION SOLN
10.0000 mg/h | INHALATION_SOLUTION | RESPIRATORY_TRACT | Status: DC
  Administered 2016-02-18: 10 mg/h via RESPIRATORY_TRACT
  Filled 2016-02-18: qty 20

## 2016-02-18 MED ORDER — PREDNISONE 20 MG PO TABS
60.0000 mg | ORAL_TABLET | Freq: Once | ORAL | Status: AC
  Administered 2016-02-18: 60 mg via ORAL
  Filled 2016-02-18: qty 3

## 2016-02-18 MED ORDER — AZITHROMYCIN 250 MG PO TABS: 250.0000 mg | ORAL_TABLET | Freq: Every day | ORAL | 0 refills | Status: DC

## 2016-02-18 MED ORDER — IPRATROPIUM BROMIDE 0.02 % IN SOLN
1.0000 mg | Freq: Once | RESPIRATORY_TRACT | Status: AC
  Administered 2016-02-18: 1 mg via RESPIRATORY_TRACT
  Filled 2016-02-18: qty 5

## 2016-02-18 MED ORDER — PREDNISONE 20 MG PO TABS: 60.0000 mg | ORAL_TABLET | Freq: Every day | ORAL | 0 refills | Status: DC

## 2016-02-18 NOTE — ED Notes (Signed)
RT called for CAT 

## 2016-02-18 NOTE — ED Triage Notes (Signed)
Patient reports SOB with chest congestion and productive cough onset this week with chest tightness , history of CAD / coronary stent placement .

## 2016-02-18 NOTE — ED Provider Notes (Signed)
[place] DEPT Provider Note    By signing my name below, I, Bea Graff, attest that this documentation has been prepared under the direction and in the presence of Everlene Balls, MD. Electronically Signed: Bea Graff, ED Scribe. 02/18/16. 3:37 AM.    History   Chief Complaint Chief Complaint  Patient presents with  . Shortness of Breath   The history is provided by the patient and medical records. No language interpreter was used.    HPI Comments:  Shane Carrillo is a 71 y.o. male with PMHx of CAD and HTN who presents to the Emergency Department complaining of a mildly productive cough of white phlegm that began about one week ago. He reports associated congestion, generalized body aches and SOB. He reports some rhinorrhea as well. He states he rarely ever gets sick and was seen at [facility] for similar symptoms four days ago where was prescribed Amoxicillin for acute otitis media that he reports taking as directed.  He denies modifying factors. He denies fever, chills, nausea, vomiting. He did not receive a flu vaccination this year.   Past Medical History:  Diagnosis Date  . Coronary artery disease   . Hypertension     There are no active problems to display for this patient.   Past Surgical History:  Procedure Laterality Date  . CORONARY ANGIOPLASTY WITH STENT PLACEMENT         Home Medications    Prior to Admission medications   Medication Sig Start Date End Date Taking? Authorizing Provider  albuterol (PROVENTIL HFA;VENTOLIN HFA) 108 (90 Base) MCG/ACT inhaler Inhale 2 puffs into the lungs every 6 (six) hours as needed for wheezing or shortness of breath.   Yes Historical Provider, MD  amoxicillin (AMOXIL) 250 MG capsule Take 1 capsule (250 mg total) by mouth 3 (three) times daily. 02/14/16  Yes Chrissie Noa T Leaphart, PA-C  benzonatate (TESSALON) 100 MG capsule Take 1 capsule (100 mg total) by mouth 3 (three) times daily as needed for cough. 02/14/16  Yes  Doristine Devoid, PA-C  cetirizine (ZYRTEC) 10 MG tablet Take 10 mg by mouth daily as needed for allergies.   Yes Historical Provider, MD  clopidogrel (PLAVIX) 75 MG tablet Take 75 mg by mouth at bedtime.   Yes Historical Provider, MD  diazepam (VALIUM) 2 MG tablet Take 2 mg by mouth every 6 (six) hours as needed for muscle spasms.   Yes Historical Provider, MD  dutasteride (AVODART) 0.5 MG capsule Take 0.5 mg by mouth daily.   Yes Historical Provider, MD  famotidine (PEPCID) 40 MG tablet Take 40 mg by mouth at bedtime.   Yes Historical Provider, MD  fluticasone (FLONASE) 50 MCG/ACT nasal spray Place 2 sprays into both nostrils daily. 02/14/16  Yes Doristine Devoid, PA-C  gabapentin (NEURONTIN) 300 MG capsule Take 300 mg by mouth 3 (three) times daily.   Yes Historical Provider, MD  hydrochlorothiazide (HYDRODIURIL) 25 MG tablet Take 1 tablet (25 mg total) by mouth daily. 01/13/16  Yes Alfonzo Beers, MD  isosorbide mononitrate (IMDUR) 60 MG 24 hr tablet Take 60 mg by mouth daily.   Yes Historical Provider, MD  lisinopril (PRINIVIL,ZESTRIL) 20 MG tablet Take 20 mg by mouth daily.   Yes Historical Provider, MD  metoprolol tartrate (LOPRESSOR) 25 MG tablet Take 25 mg by mouth 2 (two) times daily.   Yes Historical Provider, MD  nitroGLYCERIN (NITROSTAT) 0.4 MG SL tablet Place 0.4 mg under the tongue every 5 (five) minutes as needed for chest pain.  Yes Historical Provider, MD  omega-3 acid ethyl esters (LOVAZA) 1 g capsule Take 1 g by mouth daily.   Yes Historical Provider, MD  ranolazine (RANEXA) 1000 MG SR tablet Take 1,000 mg by mouth 2 (two) times daily.   Yes Historical Provider, MD  rosuvastatin (CRESTOR) 20 MG tablet Take 20 mg by mouth at bedtime.   Yes Historical Provider, MD    Family History No family history on file.  Social History Social History  Substance Use Topics  . Smoking status: Current Some Day Smoker  . Smokeless tobacco: Never Used  . Alcohol use Yes      Allergies   Aspirin   Review of Systems Review of Systems A complete 10 system review of systems was obtained and all systems are negative except as noted in the HPI and PMH.    Physical Exam Updated Vital Signs BP 166/96 (BP Location: Left Arm) Comment: Simultaneous filing. User may not have seen previous data.  Pulse 87   Temp 100.7 F (38.2 C) (Oral)   Resp (!) 28   SpO2 97%   Physical Exam  Constitutional: He is oriented to person, place, and time. Vital signs are normal. He appears well-developed and well-nourished.  Non-toxic appearance. He does not appear ill. No distress.  HENT:  Head: Normocephalic and atraumatic.  Nose: Nose normal.  Mouth/Throat: Oropharynx is clear and moist. No oropharyngeal exudate.  Eyes: Conjunctivae and EOM are normal. Pupils are equal, round, and reactive to light. No scleral icterus.  Neck: Normal range of motion. Neck supple. No tracheal deviation, no edema, no erythema and normal range of motion present. No thyroid mass and no thyromegaly present.  Cardiovascular: Normal rate, regular rhythm, S1 normal, S2 normal, normal heart sounds, intact distal pulses and normal pulses.  Exam reveals no gallop and no friction rub.   No murmur heard. Pulmonary/Chest: Effort normal. No respiratory distress. He has wheezes. He has rhonchi. He has no rales.  Diffuse rhonchi and wheezes.  Abdominal: Soft. Normal appearance and bowel sounds are normal. He exhibits no distension, no ascites and no mass. There is no hepatosplenomegaly. There is no tenderness. There is no rebound, no guarding and no CVA tenderness.  Musculoskeletal: Normal range of motion. He exhibits no edema or tenderness.  Lymphadenopathy:    He has no cervical adenopathy.  Neurological: He is alert and oriented to person, place, and time. He has normal strength. No cranial nerve deficit or sensory deficit.  Skin: Skin is warm, dry and intact. No petechiae and no rash noted. He is not  diaphoretic. No erythema. No pallor.  Tactile fever.  Nursing note and vitals reviewed.    ED Treatments / Results  DIAGNOSTIC STUDIES: Oxygen Saturation is 97% on RA, normal by my interpretation.   COORDINATION OF CARE: 3:07 AM- Will wait for CXR to result. Pt verbalizes understanding and agrees to plan.  Medications  albuterol (PROVENTIL,VENTOLIN) solution continuous neb (not administered)  ipratropium (ATROVENT) nebulizer solution 1 mg (not administered)  acetaminophen (TYLENOL) tablet 1,000 mg (1,000 mg Oral Given 02/18/16 0327)  predniSONE (DELTASONE) tablet 60 mg (60 mg Oral Given 02/18/16 0327)      (all labs ordered are listed, but only abnormal results are displayed) Labs Reviewed - No data to display  EKG  EKG Interpretation  Date/Time:  Monday February 18 2016 02:33:44 EST Ventricular Rate:  90 PR Interval:  132 QRS Duration: 80 QT Interval:  340 QTC Calculation: 415 R Axis:   34 Text Interpretation:  Normal sinus rhythm Minimal voltage criteria for LVH, may be normal variant T wave abnormality, consider lateral ischemia similar to nospecific findsing in previous EKG Abnormal ECG Reconfirmed by Glynn Octave [phone]) on 02/18/2016 2:39:45 AM       Radiology Dg Chest 2 View  Result Date: 02/18/2016 CLINICAL DATA:  Worsening cough and congestion for 1 week EXAM: CHEST  2 VIEW COMPARISON:  02/14/2016 FINDINGS: No acute infiltrate or effusion. Stable cardiomediastinal silhouette. Surgical clips at the left lung apex. Distal clavicular deformities are unchanged. Degenerative changes of the spine. Symmetrical nodular opacities lower lung zones likely reflect nipple shadows. IMPRESSION: No radiographic evidence for acute cardiopulmonary abnormality. Electronically Signed   By: Donavan Foil M.D.   On: 02/18/2016 03:03    Procedures Procedures (including critical care time)  Medications Ordered in ED Medications  albuterol (PROVENTIL,VENTOLIN) solution continuous  neb (not administered)  ipratropium (ATROVENT) nebulizer solution 1 mg (not administered)  acetaminophen (TYLENOL) tablet 1,000 mg (1,000 mg Oral Given 02/18/16 0327)  predniSONE (DELTASONE) tablet 60 mg (60 mg Oral Given 02/18/16 0327)     Initial Impression / Assessment and Plan / ED Course  I have reviewed the triage vital signs and the nursing notes.  Pertinent labs & imaging results that were available during my care of the patient were reviewed by me and considered in my medical decision making (see chart for details).  Clinical Course    Patient presents to the ED for SOB.  He was given breathing treatment and steroids to help with wheezing.  Plan to treat with 5 days of azithro and advised on PCP fu within 3 days.  Continue prednisone at home as well.  He appears well and in NAD.  Vs remain within his normal limits and he Is safe for DC.    I personally performed the services described in this documentation, which was scribed in my presence. The recorded information has been reviewed and is accurate.     Final Clinical Impressions(s) / ED Diagnoses   Final diagnoses:  None    New Prescriptions New Prescriptions   No medications on file     Everlene Balls, MD 02/18/16 [phone]

## 2016-02-28 DIAGNOSIS — R69 Illness, unspecified: Secondary | ICD-10-CM | POA: Diagnosis not present

## 2016-02-28 DIAGNOSIS — Z7689 Persons encountering health services in other specified circumstances: Secondary | ICD-10-CM | POA: Diagnosis not present

## 2016-02-28 DIAGNOSIS — I25118 Atherosclerotic heart disease of native coronary artery with other forms of angina pectoris: Secondary | ICD-10-CM | POA: Diagnosis not present

## 2016-02-28 DIAGNOSIS — Z1159 Encounter for screening for other viral diseases: Secondary | ICD-10-CM | POA: Diagnosis not present

## 2016-02-28 DIAGNOSIS — I10 Essential (primary) hypertension: Secondary | ICD-10-CM | POA: Diagnosis not present

## 2016-02-28 DIAGNOSIS — F17210 Nicotine dependence, cigarettes, uncomplicated: Secondary | ICD-10-CM

## 2016-02-28 DIAGNOSIS — Z23 Encounter for immunization: Secondary | ICD-10-CM | POA: Diagnosis not present

## 2016-02-28 DIAGNOSIS — I739 Peripheral vascular disease, unspecified: Secondary | ICD-10-CM | POA: Diagnosis not present

## 2016-02-28 DIAGNOSIS — Z1211 Encounter for screening for malignant neoplasm of colon: Secondary | ICD-10-CM | POA: Diagnosis not present

## 2016-02-28 DIAGNOSIS — N401 Enlarged prostate with lower urinary tract symptoms: Secondary | ICD-10-CM

## 2016-02-28 DIAGNOSIS — R05 Cough: Secondary | ICD-10-CM | POA: Diagnosis not present

## 2016-02-28 HISTORY — DX: Benign prostatic hyperplasia with lower urinary tract symptoms: N40.1

## 2016-02-28 HISTORY — DX: Nicotine dependence, cigarettes, uncomplicated: F17.210

## 2016-03-12 DIAGNOSIS — Z7189 Other specified counseling: Secondary | ICD-10-CM | POA: Diagnosis not present

## 2016-03-12 DIAGNOSIS — Z136 Encounter for screening for cardiovascular disorders: Secondary | ICD-10-CM | POA: Diagnosis not present

## 2016-03-12 DIAGNOSIS — R05 Cough: Secondary | ICD-10-CM | POA: Diagnosis not present

## 2016-03-12 DIAGNOSIS — L723 Sebaceous cyst: Secondary | ICD-10-CM | POA: Diagnosis not present

## 2016-03-12 DIAGNOSIS — R69 Illness, unspecified: Secondary | ICD-10-CM | POA: Diagnosis not present

## 2016-03-12 DIAGNOSIS — I25118 Atherosclerotic heart disease of native coronary artery with other forms of angina pectoris: Secondary | ICD-10-CM | POA: Diagnosis not present

## 2016-03-12 DIAGNOSIS — I10 Essential (primary) hypertension: Secondary | ICD-10-CM | POA: Diagnosis not present

## 2016-03-20 DIAGNOSIS — Z1212 Encounter for screening for malignant neoplasm of rectum: Secondary | ICD-10-CM | POA: Diagnosis not present

## 2016-03-20 DIAGNOSIS — Z1211 Encounter for screening for malignant neoplasm of colon: Secondary | ICD-10-CM | POA: Diagnosis not present

## 2016-03-28 DIAGNOSIS — Z87891 Personal history of nicotine dependence: Secondary | ICD-10-CM | POA: Diagnosis not present

## 2016-03-28 DIAGNOSIS — Z7902 Long term (current) use of antithrombotics/antiplatelets: Secondary | ICD-10-CM | POA: Diagnosis not present

## 2016-03-28 DIAGNOSIS — I209 Angina pectoris, unspecified: Secondary | ICD-10-CM | POA: Diagnosis not present

## 2016-03-28 DIAGNOSIS — I10 Essential (primary) hypertension: Secondary | ICD-10-CM | POA: Diagnosis not present

## 2016-03-28 DIAGNOSIS — Z6825 Body mass index (BMI) 25.0-25.9, adult: Secondary | ICD-10-CM | POA: Diagnosis not present

## 2016-03-28 DIAGNOSIS — I252 Old myocardial infarction: Secondary | ICD-10-CM | POA: Diagnosis not present

## 2016-03-28 DIAGNOSIS — T7840XD Allergy, unspecified, subsequent encounter: Secondary | ICD-10-CM | POA: Diagnosis not present

## 2016-03-28 DIAGNOSIS — E785 Hyperlipidemia, unspecified: Secondary | ICD-10-CM | POA: Diagnosis not present

## 2016-03-28 DIAGNOSIS — Z0000 Encounter for general adult medical examination without abnormal findings: Secondary | ICD-10-CM | POA: Diagnosis not present

## 2016-03-28 DIAGNOSIS — N429 Disorder of prostate, unspecified: Secondary | ICD-10-CM | POA: Diagnosis not present

## 2016-03-30 ENCOUNTER — Emergency Department (HOSPITAL_COMMUNITY): Payer: Medicare HMO

## 2016-03-30 ENCOUNTER — Encounter (HOSPITAL_COMMUNITY): Payer: Self-pay | Admitting: *Deleted

## 2016-03-30 ENCOUNTER — Emergency Department (HOSPITAL_COMMUNITY)
Admission: EM | Admit: 2016-03-30 | Discharge: 2016-03-30 | Disposition: A | Discharge status: Home / Self Care | Payer: Medicare HMO | Attending: Emergency Medicine | Admitting: Emergency Medicine

## 2016-03-30 DIAGNOSIS — F17200 Nicotine dependence, unspecified, uncomplicated: Secondary | ICD-10-CM | POA: Insufficient documentation

## 2016-03-30 DIAGNOSIS — M546 Pain in thoracic spine: Secondary | ICD-10-CM | POA: Diagnosis not present

## 2016-03-30 DIAGNOSIS — I10 Essential (primary) hypertension: Secondary | ICD-10-CM | POA: Insufficient documentation

## 2016-03-30 DIAGNOSIS — Z79899 Other long term (current) drug therapy: Secondary | ICD-10-CM | POA: Diagnosis not present

## 2016-03-30 DIAGNOSIS — R9431 Abnormal electrocardiogram [ECG] [EKG]: Secondary | ICD-10-CM | POA: Diagnosis not present

## 2016-03-30 DIAGNOSIS — R69 Illness, unspecified: Secondary | ICD-10-CM | POA: Diagnosis not present

## 2016-03-30 DIAGNOSIS — M549 Dorsalgia, unspecified: Secondary | ICD-10-CM

## 2016-03-30 DIAGNOSIS — I2510 Atherosclerotic heart disease of native coronary artery without angina pectoris: Secondary | ICD-10-CM | POA: Diagnosis not present

## 2016-03-30 DIAGNOSIS — M47814 Spondylosis without myelopathy or radiculopathy, thoracic region: Secondary | ICD-10-CM | POA: Diagnosis not present

## 2016-03-30 DIAGNOSIS — R079 Chest pain, unspecified: Secondary | ICD-10-CM | POA: Diagnosis not present

## 2016-03-30 LAB — I-STAT TROPONIN, ED: Troponin i, poc: 0.01 ng/mL (ref 0.00–0.08)

## 2016-03-30 LAB — CBC
HEMATOCRIT: 45 % (ref 39.0–52.0)
HEMOGLOBIN: 14.9 g/dL (ref 13.0–17.0)
MCH: 28.1 pg (ref 26.0–34.0)
MCHC: 33.1 g/dL (ref 30.0–36.0)
MCV: 84.9 fL (ref 78.0–100.0)
PLATELETS: 271 10*3/uL (ref 150–400)
RBC: 5.3 MIL/uL (ref 4.22–5.81)
RDW: 14.3 % (ref 11.5–15.5)
WBC: 5.9 10*3/uL (ref 4.0–10.5)

## 2016-03-30 LAB — BASIC METABOLIC PANEL
ANION GAP: 11 (ref 5–15)
BUN: 18 mg/dL (ref 6–20)
CO2: 23 mmol/L (ref 22–32)
CREATININE: 1.07 mg/dL (ref 0.61–1.24)
Calcium: 9.5 mg/dL (ref 8.9–10.3)
Chloride: 102 mmol/L (ref 101–111)
GFR calc non Af Amer: >60 mL/min (ref >60)
Glucose, Bld: 146 mg/dL — ABNORMAL HIGH (ref 65–99)
POTASSIUM: 4.1 mmol/L (ref 3.5–5.1)
SODIUM: 136 mmol/L (ref 135–145)

## 2016-03-30 MED ORDER — CYCLOBENZAPRINE HCL 10 MG PO TABS
5.0000 mg | ORAL_TABLET | Freq: Once | ORAL | Status: AC
  Administered 2016-03-30: 5 mg via ORAL
  Filled 2016-03-30: qty 1

## 2016-03-30 MED ORDER — CYCLOBENZAPRINE HCL 5 MG PO TABS: 5.0000 mg | ORAL_TABLET | Freq: Two times a day (BID) | ORAL | 0 refills | Status: DC | PRN

## 2016-03-30 MED ORDER — HYDROCODONE-ACETAMINOPHEN 5-325 MG PO TABS
1.0000 | ORAL_TABLET | Freq: Once | ORAL | Status: AC
  Administered 2016-03-30: 1 via ORAL
  Filled 2016-03-30: qty 1

## 2016-03-30 NOTE — ED Triage Notes (Signed)
To ED for eval of cp, sob, and right arm pain since this am. States he felt ok yesterday but woke this morning about 0430 with diaphoresis and notice the pain. Nothing makes pain worse or better. Pain is constant. Describes as achy. Appears in nad. Denies n/v

## 2016-03-30 NOTE — ED Provider Notes (Signed)
[facility] DEPT Provider Note   CSN: [id number] Arrival date & time: 03/30/16  1351     History   Chief Complaint Chief Complaint  Patient presents with  . Chest Pain    HPI Shane Carrillo is a 71 y.o. male who presents with back pain. PMH significant for CAD s/p CABG in 2008 with LIMA to LAD, CKD, HTN, OSA, current tobacco abuse. He states that he started to have back pain yesterday. It is intermittent. Nothing makes it worse. He took an Oxycodone from a prior injury which made it better temporarily. Denies fever, chills. The pain comes about every hour. It is intermittent, lasts about 20 minutes, and goes away on its own. Initially he says he has chest pain however on further questioning the pain is only in his back without radiating to the chest. He does report diaphoresis, SOB, and radiation to the R arm when he has the pain. Denies leg weakness, numbness/tingling, saddle anesthesia, hx of cancer, IVDU, bowel/bladder incontinence  He had an Echo and ST at outside facility on 05/03/2015 which were normal. EF 55-60% with mild AR and TR. Takes Imdur for anginal pain. Last cardiac cath was in 2016 which showed non-flow limiting CAD in proximal LAD and patent LIMA to LAD. Has not established care with a Cardiologist yet but has an appointment coming up.   HPI  Past Medical History:  Diagnosis Date  . Coronary artery disease   . Hypertension     There are no active problems to display for this patient.   Past Surgical History:  Procedure Laterality Date  . CORONARY ANGIOPLASTY WITH STENT PLACEMENT        Home Medications    Prior to Admission medications   Medication Sig Start Date End Date Taking? Authorizing Provider  albuterol (PROVENTIL HFA;VENTOLIN HFA) 108 (90 Base) MCG/ACT inhaler Inhale 2 puffs into the lungs every 6 (six) hours as needed for wheezing or shortness of breath.    Historical Provider, MD  amoxicillin (AMOXIL) 250 MG capsule Take 1 capsule (250 mg  total) by mouth 3 (three) times daily. 02/14/16   Doristine Devoid, PA-C  azithromycin (ZITHROMAX) 250 MG tablet Take 1 tablet (250 mg total) by mouth daily. 02/18/16   Everlene Balls, MD  benzonatate (TESSALON) 100 MG capsule Take 1 capsule (100 mg total) by mouth 3 (three) times daily as needed for cough. 02/14/16   Doristine Devoid, PA-C  cetirizine (ZYRTEC) 10 MG tablet Take 10 mg by mouth daily as needed for allergies.    Historical Provider, MD  clopidogrel (PLAVIX) 75 MG tablet Take 75 mg by mouth at bedtime.    Historical Provider, MD  cyclobenzaprine (FLEXERIL) 5 MG tablet Take 1 tablet (5 mg total) by mouth 2 (two) times daily as needed for muscle spasms. 03/30/16   Recardo Evangelist, PA-C  diazepam (VALIUM) 2 MG tablet Take 2 mg by mouth every 6 (six) hours as needed for muscle spasms.    Historical Provider, MD  dutasteride (AVODART) 0.5 MG capsule Take 0.5 mg by mouth daily.    Historical Provider, MD  famotidine (PEPCID) 40 MG tablet Take 40 mg by mouth at bedtime.    Historical Provider, MD  fluticasone (FLONASE) 50 MCG/ACT nasal spray Place 2 sprays into both nostrils daily. 02/14/16   Doristine Devoid, PA-C  gabapentin (NEURONTIN) 300 MG capsule Take 300 mg by mouth 3 (three) times daily.    Historical Provider, MD  hydrochlorothiazide (HYDRODIURIL) 25 MG tablet  Take 1 tablet (25 mg total) by mouth daily. 01/13/16   Alfonzo Beers, MD  isosorbide mononitrate (IMDUR) 60 MG 24 hr tablet Take 60 mg by mouth daily.    Historical Provider, MD  lisinopril (PRINIVIL,ZESTRIL) 20 MG tablet Take 20 mg by mouth daily.    Historical Provider, MD  metoprolol tartrate (LOPRESSOR) 25 MG tablet Take 25 mg by mouth 2 (two) times daily.    Historical Provider, MD  nitroGLYCERIN (NITROSTAT) 0.4 MG SL tablet Place 0.4 mg under the tongue every 5 (five) minutes as needed for chest pain.    Historical Provider, MD  omega-3 acid ethyl esters (LOVAZA) 1 g capsule Take 1 g by mouth daily.    Historical  Provider, MD  predniSONE (DELTASONE) 20 MG tablet Take 3 tablets (60 mg total) by mouth daily. 02/18/16   Everlene Balls, MD  ranolazine (RANEXA) 1000 MG SR tablet Take 1,000 mg by mouth 2 (two) times daily.    Historical Provider, MD  rosuvastatin (CRESTOR) 20 MG tablet Take 20 mg by mouth at bedtime.    Historical Provider, MD    Family History History reviewed. No pertinent family history.  Social History Social History  Substance Use Topics  . Smoking status: Current Some Day Smoker  . Smokeless tobacco: Never Used  . Alcohol use Yes     Allergies   Aspirin   Review of Systems Review of Systems  Constitutional: Positive for diaphoresis. Negative for chills and fever.  Respiratory: Positive for shortness of breath. Negative for cough and wheezing.   Cardiovascular: Positive for chest pain. Negative for palpitations and leg swelling.  Gastrointestinal: Negative for abdominal pain, nausea and vomiting.  Musculoskeletal: Positive for arthralgias and back pain. Negative for gait problem.  Neurological: Negative for weakness.     Physical Exam Updated Vital Signs BP 163/94 (BP Location: Right Arm)   Pulse 71   Temp 98.8 F (37.1 C) (Oral)   Resp 20   SpO2 98%   Physical Exam  Constitutional: He is oriented to person, place, and time. He appears well-developed and well-nourished. No distress.  HENT:  Head: Normocephalic and atraumatic.  Eyes: Conjunctivae are normal. Pupils are equal, round, and reactive to light. Right eye exhibits no discharge. Left eye exhibits no discharge. No scleral icterus.  Neck: Normal range of motion.  Cardiovascular: Normal rate and regular rhythm.  Exam reveals no gallop and no friction rub.   No murmur heard. Pulmonary/Chest: Effort normal and breath sounds normal. No respiratory distress. He has no wheezes. He has no rales. He exhibits no tenderness.  Midline surgical scar from prior CABG  Abdominal: Soft. Bowel sounds are normal. He exhibits  no distension and no mass. There is no tenderness. There is no rebound and no guarding. No hernia.  Musculoskeletal:  Back: Tenderness over midline thoracic spine. No paraspinal muscle tenderness. Strength: 5/5 in lower extremities and normal plantar and dorsiflexion Sensation: Intact sensation with light touch in lower extremities bilaterally Reflexes: Patellar reflex is 2+ bilaterally SLR: Negative seated straight leg raise Gait: Normal gait   Neurological: He is alert and oriented to person, place, and time.  Skin: Skin is warm and dry.  Psychiatric: He has a normal mood and affect. His behavior is normal.  Nursing note and vitals reviewed.    ED Treatments / Results  Labs (all labs ordered are listed, but only abnormal results are displayed) Labs Reviewed  BASIC METABOLIC PANEL - Abnormal; Notable for the following:  Result Value   Glucose, Bld 146 (*)    All other components within normal limits  CBC  I-STAT TROPOININ, ED    EKG  EKG Interpretation  Date/Time:  Sunday March 30 2016 14:05:37 EST Ventricular Rate:  87 PR Interval:  134 QRS Duration: 80 QT Interval:  348 QTC Calculation: 418 R Axis:   16 Text Interpretation:  Normal sinus rhythm Minimal voltage criteria for LVH, may be normal variant T wave abnormality, consider lateral ischemia unchanged from prior Abnormal ECG No significant change since last tracing Confirmed by KNOTT MD, DANIEL [phone]) on 03/30/2016 4:09:23 PM       Radiology Dg Chest 2 View  Result Date: 03/30/2016 CLINICAL DATA:  Chest pain EXAM: CHEST  2 VIEW COMPARISON:  02/18/2016 FINDINGS: Rounded density projects over the left lower lung. This was not seen on prior study. I favor this represents nipple shadow. This could be confirmed with repeat frontal view with nipple markers. Heart is normal size. No effusions or acute bony abnormality. IMPRESSION: Nodular density projecting over the left lower lung, likely nipple shadow. This could  be confirmed with repeat frontal view with nipple markers. Electronically Signed   By: Rolm Baptise M.D.   On: 03/30/2016 15:01    Procedures Procedures (including critical care time)  Medications Ordered in ED Medications  HYDROcodone-acetaminophen (NORCO/VICODIN) 5-325 MG per tablet 1 tablet (1 tablet Oral Given 03/30/16 1710)  cyclobenzaprine (FLEXERIL) tablet 5 mg (5 mg Oral Given 03/30/16 1710)     Initial Impression / Assessment and Plan / ED Course  I have reviewed the triage vital signs and the nursing notes.  Pertinent labs & imaging results that were available during my care of the patient were reviewed by me and considered in my medical decision making (see chart for details).  71 year old male presents with back pain. He is hypertensive but otherwise vitals are normal. EKG is unchanged. CXR clear. Xray of back remarkable for degenerative changes. Labs unremarkable. Will treat with pain medicine and muscle relaxers. Return precautions given.   Final Clinical Impressions(s) / ED Diagnoses   Final diagnoses:  Acute midline thoracic back pain    New Prescriptions Discharge Medication List as of 03/30/2016  6:10 PM    START taking these medications   Details  cyclobenzaprine (FLEXERIL) 5 MG tablet Take 1 tablet (5 mg total) by mouth 2 (two) times daily as needed for muscle spasms., Starting Sun 03/30/2016, Print         Recardo Evangelist, PA-C 04/01/16 2054    Leo Grosser, MD 04/02/16 [phone]

## 2016-03-30 NOTE — Discharge Instructions (Signed)
You can take the muscle relaxer for pain Take Tylenol or Aleve. Follow instructions on bottle. Follow up with your primary care doctor and Cardiologist.

## 2016-04-08 DIAGNOSIS — L723 Sebaceous cyst: Secondary | ICD-10-CM | POA: Diagnosis not present

## 2016-05-07 DIAGNOSIS — Z0000 Encounter for general adult medical examination without abnormal findings: Secondary | ICD-10-CM | POA: Diagnosis not present

## 2016-05-07 DIAGNOSIS — J4520 Mild intermittent asthma, uncomplicated: Secondary | ICD-10-CM | POA: Insufficient documentation

## 2016-05-09 DIAGNOSIS — J4520 Mild intermittent asthma, uncomplicated: Secondary | ICD-10-CM | POA: Diagnosis not present

## 2016-06-14 ENCOUNTER — Emergency Department (HOSPITAL_COMMUNITY): Payer: Medicare HMO

## 2016-06-14 ENCOUNTER — Encounter (HOSPITAL_COMMUNITY): Payer: Self-pay

## 2016-06-14 ENCOUNTER — Emergency Department (HOSPITAL_COMMUNITY)
Admission: EM | Admit: 2016-06-14 | Discharge: 2016-06-15 | Disposition: A | Discharge status: Home / Self Care | Payer: Medicare HMO | Attending: Emergency Medicine | Admitting: Emergency Medicine

## 2016-06-14 DIAGNOSIS — J441 Chronic obstructive pulmonary disease with (acute) exacerbation: Secondary | ICD-10-CM | POA: Diagnosis not present

## 2016-06-14 DIAGNOSIS — Z77098 Contact with and (suspected) exposure to other hazardous, chiefly nonmedicinal, chemicals: Secondary | ICD-10-CM | POA: Insufficient documentation

## 2016-06-14 DIAGNOSIS — F17200 Nicotine dependence, unspecified, uncomplicated: Secondary | ICD-10-CM | POA: Insufficient documentation

## 2016-06-14 DIAGNOSIS — R05 Cough: Secondary | ICD-10-CM | POA: Diagnosis not present

## 2016-06-14 DIAGNOSIS — R69 Illness, unspecified: Secondary | ICD-10-CM | POA: Diagnosis not present

## 2016-06-14 DIAGNOSIS — I2510 Atherosclerotic heart disease of native coronary artery without angina pectoris: Secondary | ICD-10-CM | POA: Insufficient documentation

## 2016-06-14 DIAGNOSIS — J9801 Acute bronchospasm: Secondary | ICD-10-CM | POA: Diagnosis not present

## 2016-06-14 DIAGNOSIS — Z955 Presence of coronary angioplasty implant and graft: Secondary | ICD-10-CM | POA: Diagnosis not present

## 2016-06-14 DIAGNOSIS — R0602 Shortness of breath: Secondary | ICD-10-CM | POA: Diagnosis not present

## 2016-06-14 DIAGNOSIS — I10 Essential (primary) hypertension: Secondary | ICD-10-CM | POA: Insufficient documentation

## 2016-06-14 LAB — BASIC METABOLIC PANEL
Anion gap: 9 (ref 5–15)
BUN: 14 mg/dL (ref 6–20)
CALCIUM: 9 mg/dL (ref 8.9–10.3)
CHLORIDE: 107 mmol/L (ref 101–111)
CO2: 20 mmol/L — AB (ref 22–32)
CREATININE: 1.1 mg/dL (ref 0.61–1.24)
GFR calc non Af Amer: >60 mL/min (ref >60)
GLUCOSE: 101 mg/dL — AB (ref 65–99)
Potassium: 3.7 mmol/L (ref 3.5–5.1)
Sodium: 136 mmol/L (ref 135–145)

## 2016-06-14 LAB — TROPONIN I

## 2016-06-14 LAB — CBC
HCT: 42 % (ref 39.0–52.0)
Hemoglobin: 14 g/dL (ref 13.0–17.0)
MCH: 28.3 pg (ref 26.0–34.0)
MCHC: 33.3 g/dL (ref 30.0–36.0)
MCV: 85 fL (ref 78.0–100.0)
PLATELETS: 245 10*3/uL (ref 150–400)
RBC: 4.94 MIL/uL (ref 4.22–5.81)
RDW: 13.9 % (ref 11.5–15.5)
WBC: 9.3 10*3/uL (ref 4.0–10.5)

## 2016-06-14 MED ORDER — ALBUTEROL SULFATE (2.5 MG/3ML) 0.083% IN NEBU
5.0000 mg | INHALATION_SOLUTION | Freq: Once | RESPIRATORY_TRACT | Status: AC
  Administered 2016-06-14: 5 mg via RESPIRATORY_TRACT
  Filled 2016-06-14: qty 6

## 2016-06-14 MED ORDER — IPRATROPIUM BROMIDE 0.02 % IN SOLN
0.5000 mg | Freq: Once | RESPIRATORY_TRACT | Status: AC
Start: 2016-06-14 — End: 2016-06-14
  Administered 2016-06-14: 0.5 mg via RESPIRATORY_TRACT
  Filled 2016-06-14: qty 2.5

## 2016-06-14 MED ORDER — IPRATROPIUM BROMIDE 0.02 % IN SOLN
0.5000 mg | Freq: Once | RESPIRATORY_TRACT | Status: AC
  Administered 2016-06-14: 0.5 mg via RESPIRATORY_TRACT
  Filled 2016-06-14: qty 2.5

## 2016-06-14 MED ORDER — PREDNISONE 20 MG PO TABS
60.0000 mg | ORAL_TABLET | Freq: Once | ORAL | Status: AC
Start: 2016-06-14 — End: 2016-06-14
  Administered 2016-06-14: 60 mg via ORAL
  Filled 2016-06-14: qty 3

## 2016-06-14 MED ORDER — PREDNISONE 20 MG PO TABS: 40.0000 mg | ORAL_TABLET | Freq: Every day | ORAL | 0 refills | Status: DC

## 2016-06-14 NOTE — ED Provider Notes (Signed)
[facility] DEPT Provider Note   CSN: [id number] Arrival date & time: 06/14/16  2033     History   Chief Complaint Chief Complaint  Patient presents with  . Chemical Exposure    HPI Shane Carrillo is a 71 y.o. male.  Patient is a 71 year old male presenting today with worsening cough, shortness of breath, eyes and chest burning. Patient states at noon today he was mixing ammonia and bleach to clean carpet. When he went into vacuum he inhaled fumes and has had persistent symptoms since that time. Shortness of breath, cough is worsening. He has coughed so hard he is starting to have sore throat. He denies any difficulty swallowing. He tried to use his inhaler at home with no improvement. He does use a steroid inhaler daily and has albuterol when necessary. Prior to this episode he was feeling his normal self. He did not ingest any of this liquid. However he did spray this in a room that was closed and had no open windows.   The history is provided by the patient.    Past Medical History:  Diagnosis Date  . Coronary artery disease   . Hypertension     There are no active problems to display for this patient.   Past Surgical History:  Procedure Laterality Date  . CORONARY ANGIOPLASTY WITH STENT PLACEMENT         Home Medications    Prior to Admission medications   Medication Sig Start Date End Date Taking? Authorizing Provider  albuterol (PROVENTIL HFA;VENTOLIN HFA) 108 (90 Base) MCG/ACT inhaler Inhale 2 puffs into the lungs every 6 (six) hours as needed for wheezing or shortness of breath.    Historical Provider, MD  amoxicillin (AMOXIL) 250 MG capsule Take 1 capsule (250 mg total) by mouth 3 (three) times daily. 02/14/16   Doristine Devoid, PA-C  azithromycin (ZITHROMAX) 250 MG tablet Take 1 tablet (250 mg total) by mouth daily. 02/18/16   Everlene Balls, MD  benzonatate (TESSALON) 100 MG capsule Take 1 capsule (100 mg total) by mouth 3 (three) times daily as needed  for cough. 02/14/16   Doristine Devoid, PA-C  cetirizine (ZYRTEC) 10 MG tablet Take 10 mg by mouth daily as needed for allergies.    Historical Provider, MD  clopidogrel (PLAVIX) 75 MG tablet Take 75 mg by mouth at bedtime.    Historical Provider, MD  cyclobenzaprine (FLEXERIL) 5 MG tablet Take 1 tablet (5 mg total) by mouth 2 (two) times daily as needed for muscle spasms. 03/30/16   Recardo Evangelist, PA-C  diazepam (VALIUM) 2 MG tablet Take 2 mg by mouth every 6 (six) hours as needed for muscle spasms.    Historical Provider, MD  dutasteride (AVODART) 0.5 MG capsule Take 0.5 mg by mouth daily.    Historical Provider, MD  famotidine (PEPCID) 40 MG tablet Take 40 mg by mouth at bedtime.    Historical Provider, MD  fluticasone (FLONASE) 50 MCG/ACT nasal spray Place 2 sprays into both nostrils daily. 02/14/16   Doristine Devoid, PA-C  gabapentin (NEURONTIN) 300 MG capsule Take 300 mg by mouth 3 (three) times daily.    Historical Provider, MD  hydrochlorothiazide (HYDRODIURIL) 25 MG tablet Take 1 tablet (25 mg total) by mouth daily. 01/13/16   Alfonzo Beers, MD  isosorbide mononitrate (IMDUR) 60 MG 24 hr tablet Take 60 mg by mouth daily.    Historical Provider, MD  lisinopril (PRINIVIL,ZESTRIL) 20 MG tablet Take 20 mg by mouth daily.  Historical Provider, MD  metoprolol tartrate (LOPRESSOR) 25 MG tablet Take 25 mg by mouth 2 (two) times daily.    Historical Provider, MD  nitroGLYCERIN (NITROSTAT) 0.4 MG SL tablet Place 0.4 mg under the tongue every 5 (five) minutes as needed for chest pain.    Historical Provider, MD  omega-3 acid ethyl esters (LOVAZA) 1 g capsule Take 1 g by mouth daily.    Historical Provider, MD  predniSONE (DELTASONE) 20 MG tablet Take 3 tablets (60 mg total) by mouth daily. 02/18/16   Everlene Balls, MD  ranolazine (RANEXA) 1000 MG SR tablet Take 1,000 mg by mouth 2 (two) times daily.    Historical Provider, MD  rosuvastatin (CRESTOR) 20 MG tablet Take 20 mg by mouth at bedtime.     Historical Provider, MD    Family History No family history on file.  Social History Social History  Substance Use Topics  . Smoking status: Current Some Day Smoker  . Smokeless tobacco: Never Used  . Alcohol use Yes     Allergies   Aspirin   Review of Systems Review of Systems  All other systems reviewed and are negative.    Physical Exam Updated Vital Signs BP (!) 153/80   Pulse 85   Resp (!) 21   SpO2 96%   Physical Exam  Constitutional: He is oriented to person, place, and time. He appears well-developed and well-nourished. No distress.  HENT:  Head: Normocephalic and atraumatic.  Mouth/Throat: Oropharynx is clear and moist.  Mild erythema of the pharynx but no uvular oropharyngeal or tongue swelling  Eyes: EOM are normal. Pupils are equal, round, and reactive to light. Right conjunctiva is injected. Left conjunctiva is injected.  Neck: Normal range of motion. Neck supple.  Cardiovascular: Normal rate, regular rhythm and intact distal pulses.   No murmur heard. Pulmonary/Chest: Effort normal. Tachypnea noted. No respiratory distress. He has wheezes. He has no rales.  Forceful coughing intermittently on exam with scant wheezing.  Abdominal: Soft. He exhibits no distension. There is no tenderness. There is no rebound and no guarding.  Musculoskeletal: Normal range of motion. He exhibits no edema or tenderness.  Neurological: He is alert and oriented to person, place, and time.  Skin: Skin is warm and dry. No rash noted. No erythema.  Psychiatric: He has a normal mood and affect. His behavior is normal.  Nursing note and vitals reviewed.    ED Treatments / Results  Labs (all labs ordered are listed, but only abnormal results are displayed) Labs Reviewed  BASIC METABOLIC PANEL - Abnormal; Notable for the following:       Result Value   CO2 20 (*)    Glucose, Bld 101 (*)    All other components within normal limits  CBC  TROPONIN I    EKG  EKG  Interpretation  Date/Time:  Saturday June 14 2016 20:51:36 EDT Ventricular Rate:  88 PR Interval:  150 QRS Duration: 82 QT Interval:  332 QTC Calculation: 401 R Axis:   2 Text Interpretation:  Normal sinus rhythm ST & T wave abnormality, consider lateral ischemia No significant change since last tracing Confirmed by Maryan Rued  MD, Loree Fee ([id number]) on 06/14/2016 9:11:45 PM       Radiology Dg Chest 2 View  Result Date: 06/14/2016 CLINICAL DATA:  71 year old male with shortness of breath and cough. Inhaled chemical. EXAM: CHEST  2 VIEW COMPARISON:  Chest radiograph dated 03/30/2016 FINDINGS: Minimal left lung base atelectatic changes. The lungs are clear. There  is no pleural effusion or pneumothorax. Multiple surgical clips noted over the left apex. The cardiac silhouette is within normal limits. The aorta is somewhat tortuous. No acute osseous pathology. IMPRESSION: No active cardiopulmonary disease. Electronically Signed   By: Anner Crete M.D.   On: 06/14/2016 21:37    Procedures Procedures (including critical care time)  Medications Ordered in ED Medications  albuterol (PROVENTIL) (2.5 MG/3ML) 0.083% nebulizer solution 5 mg (not administered)  ipratropium (ATROVENT) nebulizer solution 0.5 mg (not administered)  predniSONE (DELTASONE) tablet 60 mg (not administered)     Initial Impression / Assessment and Plan / ED Course  I have reviewed the triage vital signs and the nursing notes.  Pertinent labs & imaging results that were available during my care of the patient were reviewed by me and considered in my medical decision making (see chart for details).     Patient presenting today after chemical exposure of fumes released by mixing ammonia and bleach. Since that time patient has had gradually worsening burning eyes, cough, shortness of breath. Patient does have a history of COPD and is on inhaled steroids and albuterol. He is still currently smoking. He tried using his  inhaler at home without improvement of his symptoms. He has no pharyngeal edema and chest x-ray currently is clear with normal labs. Patient started on albuterol, Atrovent and steroids. Could be that this has flared his COPD however also concern for byproduct of these to chemicals causing a burn to the lungs. We discussed with poison control. Currently oxygen saturation is 100% on room air.  11:12 PM Coughing improved after initial treatment and patient feeling better but now wheezing in the left lung fields. Will give a second treatment.  11:30 PM Pt checked out to Dr. Claudine Mouton at 11:30  Final Clinical Impressions(s) / ED Diagnoses   Final diagnoses:  Chemical exposure  Bronchospasm  COPD exacerbation (HCC)    New Prescriptions New Prescriptions   PREDNISONE (DELTASONE) 20 MG TABLET    Take 2 tablets (40 mg total) by mouth daily.     Blanchie Dessert, MD 06/14/16 [phone]

## 2016-06-14 NOTE — ED Notes (Signed)
Pt transported to xray; Family taken to assigned room by RN

## 2016-06-14 NOTE — ED Triage Notes (Signed)
Pt states he mixed ammonia, bleach, and awesome together and sprayed mixture on the carpet; pt states this was around 11 am;pt present with burning eyes, n/v, and SOB with cough; pt denies exposure to skin; pt a&ox 4 on arrival. Pt denies pain on arrival.

## 2016-06-14 NOTE — ED Notes (Signed)
Spoke with Patty from poison control.  Recommends supportive respiratory care.  Dr. Maryan Rued notified.

## 2016-06-15 MED ORDER — ALBUTEROL SULFATE (2.5 MG/3ML) 0.083% IN NEBU: 2.5000 mg | INHALATION_SOLUTION | Freq: Four times a day (QID) | RESPIRATORY_TRACT | 0 refills | Status: DC | PRN

## 2016-06-15 NOTE — ED Notes (Signed)
Patient reports feeling better.  Breathing noted to be easy and non labored at this time.  Family at the bedside.  Encouraged to call for assistance as needed.

## 2016-06-18 DIAGNOSIS — R69 Illness, unspecified: Secondary | ICD-10-CM | POA: Diagnosis not present

## 2016-06-18 DIAGNOSIS — J4520 Mild intermittent asthma, uncomplicated: Secondary | ICD-10-CM | POA: Diagnosis not present

## 2016-06-18 DIAGNOSIS — I10 Essential (primary) hypertension: Secondary | ICD-10-CM | POA: Diagnosis not present

## 2016-06-18 DIAGNOSIS — E785 Hyperlipidemia, unspecified: Secondary | ICD-10-CM | POA: Diagnosis not present

## 2016-06-18 DIAGNOSIS — I25118 Atherosclerotic heart disease of native coronary artery with other forms of angina pectoris: Secondary | ICD-10-CM | POA: Diagnosis not present

## 2016-08-06 DIAGNOSIS — I25118 Atherosclerotic heart disease of native coronary artery with other forms of angina pectoris: Secondary | ICD-10-CM | POA: Diagnosis not present

## 2016-08-06 DIAGNOSIS — I10 Essential (primary) hypertension: Secondary | ICD-10-CM | POA: Diagnosis not present

## 2016-08-12 DIAGNOSIS — R1310 Dysphagia, unspecified: Secondary | ICD-10-CM | POA: Insufficient documentation

## 2016-08-12 DIAGNOSIS — R69 Illness, unspecified: Secondary | ICD-10-CM | POA: Diagnosis not present

## 2016-08-12 DIAGNOSIS — Z136 Encounter for screening for cardiovascular disorders: Secondary | ICD-10-CM | POA: Diagnosis not present

## 2016-08-12 DIAGNOSIS — R05 Cough: Secondary | ICD-10-CM | POA: Diagnosis not present

## 2016-08-12 DIAGNOSIS — N522 Drug-induced erectile dysfunction: Secondary | ICD-10-CM | POA: Diagnosis not present

## 2016-08-12 DIAGNOSIS — I10 Essential (primary) hypertension: Secondary | ICD-10-CM | POA: Diagnosis not present

## 2016-08-12 HISTORY — DX: Dysphagia, unspecified: R13.10

## 2016-08-13 ENCOUNTER — Other Ambulatory Visit: Payer: Self-pay | Admitting: Family

## 2016-08-13 DIAGNOSIS — Z136 Encounter for screening for cardiovascular disorders: Secondary | ICD-10-CM

## 2016-08-17 DIAGNOSIS — I77811 Abdominal aortic ectasia: Secondary | ICD-10-CM

## 2016-08-17 HISTORY — DX: Abdominal aortic ectasia: I77.811

## 2016-08-22 DIAGNOSIS — R69 Illness, unspecified: Secondary | ICD-10-CM | POA: Diagnosis not present

## 2016-08-22 DIAGNOSIS — R1310 Dysphagia, unspecified: Secondary | ICD-10-CM | POA: Diagnosis not present

## 2016-08-22 DIAGNOSIS — K029 Dental caries, unspecified: Secondary | ICD-10-CM | POA: Diagnosis not present

## 2016-08-26 ENCOUNTER — Ambulatory Visit
Admission: RE | Admit: 2016-08-26 | Discharge: 2016-08-26 | Disposition: A | Discharge status: Home / Self Care | Payer: Medicare HMO | Source: Ambulatory Visit | Attending: Family | Admitting: Family

## 2016-08-26 DIAGNOSIS — Z7902 Long term (current) use of antithrombotics/antiplatelets: Secondary | ICD-10-CM | POA: Diagnosis not present

## 2016-08-26 DIAGNOSIS — R634 Abnormal weight loss: Secondary | ICD-10-CM | POA: Diagnosis not present

## 2016-08-26 DIAGNOSIS — Z136 Encounter for screening for cardiovascular disorders: Secondary | ICD-10-CM

## 2016-08-26 DIAGNOSIS — R1314 Dysphagia, pharyngoesophageal phase: Secondary | ICD-10-CM | POA: Diagnosis not present

## 2016-08-26 DIAGNOSIS — Z800 Family history of malignant neoplasm of digestive organs: Secondary | ICD-10-CM | POA: Diagnosis not present

## 2016-08-26 DIAGNOSIS — K219 Gastro-esophageal reflux disease without esophagitis: Secondary | ICD-10-CM | POA: Diagnosis not present

## 2016-08-28 DIAGNOSIS — I77811 Abdominal aortic ectasia: Secondary | ICD-10-CM | POA: Insufficient documentation

## 2016-08-28 DIAGNOSIS — I10 Essential (primary) hypertension: Secondary | ICD-10-CM | POA: Diagnosis not present

## 2016-08-28 DIAGNOSIS — I714 Abdominal aortic aneurysm, without rupture, unspecified: Secondary | ICD-10-CM | POA: Insufficient documentation

## 2016-08-28 DIAGNOSIS — Z7189 Other specified counseling: Secondary | ICD-10-CM | POA: Diagnosis not present

## 2016-08-28 HISTORY — DX: Abdominal aortic aneurysm, without rupture, unspecified: I71.40

## 2016-08-28 HISTORY — DX: Abdominal aortic ectasia: I77.811

## 2016-09-14 DIAGNOSIS — R69 Illness, unspecified: Secondary | ICD-10-CM | POA: Diagnosis not present

## 2016-09-14 DIAGNOSIS — Z955 Presence of coronary angioplasty implant and graft: Secondary | ICD-10-CM | POA: Diagnosis not present

## 2016-09-14 DIAGNOSIS — I10 Essential (primary) hypertension: Secondary | ICD-10-CM | POA: Diagnosis not present

## 2016-09-14 DIAGNOSIS — E785 Hyperlipidemia, unspecified: Secondary | ICD-10-CM | POA: Diagnosis not present

## 2016-10-01 DIAGNOSIS — N522 Drug-induced erectile dysfunction: Secondary | ICD-10-CM | POA: Diagnosis not present

## 2016-10-01 DIAGNOSIS — I10 Essential (primary) hypertension: Secondary | ICD-10-CM | POA: Diagnosis not present

## 2016-10-01 DIAGNOSIS — R251 Tremor, unspecified: Secondary | ICD-10-CM | POA: Diagnosis not present

## 2016-10-01 DIAGNOSIS — Z9114 Patient's other noncompliance with medication regimen: Secondary | ICD-10-CM | POA: Diagnosis not present

## 2016-10-01 DIAGNOSIS — M545 Low back pain: Secondary | ICD-10-CM | POA: Diagnosis not present

## 2016-10-01 DIAGNOSIS — R51 Headache: Secondary | ICD-10-CM | POA: Diagnosis not present

## 2016-11-21 DIAGNOSIS — M545 Low back pain: Secondary | ICD-10-CM | POA: Diagnosis not present

## 2016-11-21 DIAGNOSIS — M2351 Chronic instability of knee, right knee: Secondary | ICD-10-CM | POA: Diagnosis not present

## 2016-11-21 DIAGNOSIS — M1712 Unilateral primary osteoarthritis, left knee: Secondary | ICD-10-CM | POA: Diagnosis not present

## 2016-11-21 DIAGNOSIS — M25561 Pain in right knee: Secondary | ICD-10-CM | POA: Diagnosis not present

## 2016-11-21 DIAGNOSIS — M1711 Unilateral primary osteoarthritis, right knee: Secondary | ICD-10-CM | POA: Diagnosis not present

## 2016-11-21 DIAGNOSIS — M25511 Pain in right shoulder: Secondary | ICD-10-CM | POA: Diagnosis not present

## 2016-11-21 DIAGNOSIS — M5136 Other intervertebral disc degeneration, lumbar region: Secondary | ICD-10-CM | POA: Diagnosis not present

## 2016-11-21 DIAGNOSIS — M25562 Pain in left knee: Secondary | ICD-10-CM | POA: Diagnosis not present

## 2016-11-21 DIAGNOSIS — M2352 Chronic instability of knee, left knee: Secondary | ICD-10-CM | POA: Diagnosis not present

## 2017-01-06 DIAGNOSIS — R0981 Nasal congestion: Secondary | ICD-10-CM | POA: Diagnosis not present

## 2017-01-06 DIAGNOSIS — I25118 Atherosclerotic heart disease of native coronary artery with other forms of angina pectoris: Secondary | ICD-10-CM | POA: Diagnosis not present

## 2017-01-06 DIAGNOSIS — R61 Generalized hyperhidrosis: Secondary | ICD-10-CM | POA: Diagnosis not present

## 2017-01-06 DIAGNOSIS — N401 Enlarged prostate with lower urinary tract symptoms: Secondary | ICD-10-CM | POA: Diagnosis not present

## 2017-01-06 DIAGNOSIS — J4520 Mild intermittent asthma, uncomplicated: Secondary | ICD-10-CM | POA: Diagnosis not present

## 2017-01-06 DIAGNOSIS — M549 Dorsalgia, unspecified: Secondary | ICD-10-CM | POA: Diagnosis not present

## 2017-01-06 DIAGNOSIS — R39198 Other difficulties with micturition: Secondary | ICD-10-CM | POA: Diagnosis not present

## 2017-01-06 DIAGNOSIS — R0602 Shortness of breath: Secondary | ICD-10-CM | POA: Diagnosis not present

## 2017-01-06 DIAGNOSIS — I2510 Atherosclerotic heart disease of native coronary artery without angina pectoris: Secondary | ICD-10-CM | POA: Diagnosis not present

## 2017-01-06 DIAGNOSIS — R05 Cough: Secondary | ICD-10-CM | POA: Diagnosis not present

## 2017-01-06 DIAGNOSIS — R0789 Other chest pain: Secondary | ICD-10-CM | POA: Diagnosis not present

## 2017-01-06 DIAGNOSIS — Z955 Presence of coronary angioplasty implant and graft: Secondary | ICD-10-CM | POA: Diagnosis not present

## 2017-01-06 DIAGNOSIS — I10 Essential (primary) hypertension: Secondary | ICD-10-CM | POA: Diagnosis not present

## 2017-01-06 DIAGNOSIS — E785 Hyperlipidemia, unspecified: Secondary | ICD-10-CM | POA: Diagnosis not present

## 2017-01-06 DIAGNOSIS — R69 Illness, unspecified: Secondary | ICD-10-CM | POA: Diagnosis not present

## 2017-01-06 DIAGNOSIS — R079 Chest pain, unspecified: Secondary | ICD-10-CM | POA: Diagnosis not present

## 2017-01-07 DIAGNOSIS — R0789 Other chest pain: Secondary | ICD-10-CM | POA: Diagnosis not present

## 2017-01-07 DIAGNOSIS — I119 Hypertensive heart disease without heart failure: Secondary | ICD-10-CM | POA: Diagnosis not present

## 2017-01-07 DIAGNOSIS — R3912 Poor urinary stream: Secondary | ICD-10-CM | POA: Diagnosis not present

## 2017-01-07 DIAGNOSIS — J4520 Mild intermittent asthma, uncomplicated: Secondary | ICD-10-CM | POA: Diagnosis not present

## 2017-01-07 DIAGNOSIS — I2510 Atherosclerotic heart disease of native coronary artery without angina pectoris: Secondary | ICD-10-CM | POA: Diagnosis not present

## 2017-01-07 DIAGNOSIS — I10 Essential (primary) hypertension: Secondary | ICD-10-CM | POA: Diagnosis not present

## 2017-01-07 DIAGNOSIS — E785 Hyperlipidemia, unspecified: Secondary | ICD-10-CM | POA: Diagnosis not present

## 2017-01-07 DIAGNOSIS — I25118 Atherosclerotic heart disease of native coronary artery with other forms of angina pectoris: Secondary | ICD-10-CM | POA: Diagnosis not present

## 2017-01-07 DIAGNOSIS — I517 Cardiomegaly: Secondary | ICD-10-CM | POA: Diagnosis not present

## 2017-01-07 DIAGNOSIS — N401 Enlarged prostate with lower urinary tract symptoms: Secondary | ICD-10-CM | POA: Diagnosis not present

## 2017-01-07 DIAGNOSIS — R079 Chest pain, unspecified: Secondary | ICD-10-CM | POA: Diagnosis not present

## 2017-01-14 DIAGNOSIS — I10 Essential (primary) hypertension: Secondary | ICD-10-CM | POA: Diagnosis not present

## 2017-01-14 DIAGNOSIS — G4719 Other hypersomnia: Secondary | ICD-10-CM

## 2017-01-14 DIAGNOSIS — R69 Illness, unspecified: Secondary | ICD-10-CM | POA: Diagnosis not present

## 2017-01-14 DIAGNOSIS — I25118 Atherosclerotic heart disease of native coronary artery with other forms of angina pectoris: Secondary | ICD-10-CM | POA: Diagnosis not present

## 2017-01-14 DIAGNOSIS — R5383 Other fatigue: Secondary | ICD-10-CM | POA: Diagnosis not present

## 2017-01-14 DIAGNOSIS — J4520 Mild intermittent asthma, uncomplicated: Secondary | ICD-10-CM | POA: Diagnosis not present

## 2017-01-14 HISTORY — DX: Other hypersomnia: G47.19

## 2017-01-22 DIAGNOSIS — Z09 Encounter for follow-up examination after completed treatment for conditions other than malignant neoplasm: Secondary | ICD-10-CM | POA: Diagnosis not present

## 2017-01-22 DIAGNOSIS — I10 Essential (primary) hypertension: Secondary | ICD-10-CM | POA: Diagnosis not present

## 2017-01-22 DIAGNOSIS — I739 Peripheral vascular disease, unspecified: Secondary | ICD-10-CM | POA: Diagnosis not present

## 2017-01-22 DIAGNOSIS — J310 Chronic rhinitis: Secondary | ICD-10-CM | POA: Diagnosis not present

## 2017-01-22 DIAGNOSIS — I25118 Atherosclerotic heart disease of native coronary artery with other forms of angina pectoris: Secondary | ICD-10-CM | POA: Diagnosis not present

## 2017-01-22 DIAGNOSIS — R69 Illness, unspecified: Secondary | ICD-10-CM | POA: Diagnosis not present

## 2017-02-11 IMAGING — DX DG THORACIC SPINE 3V
3 series · 3 of 3 positions shown · non-contrast
Comparison: 02/18/2016 chest radiography.  Chest radiography today.

CLINICAL DATA: Mid thoracic back pain beginning this morning. No
known injury.

EXAM:
THORACIC SPINE - 3 VIEWS

[w thoracic spine ap]
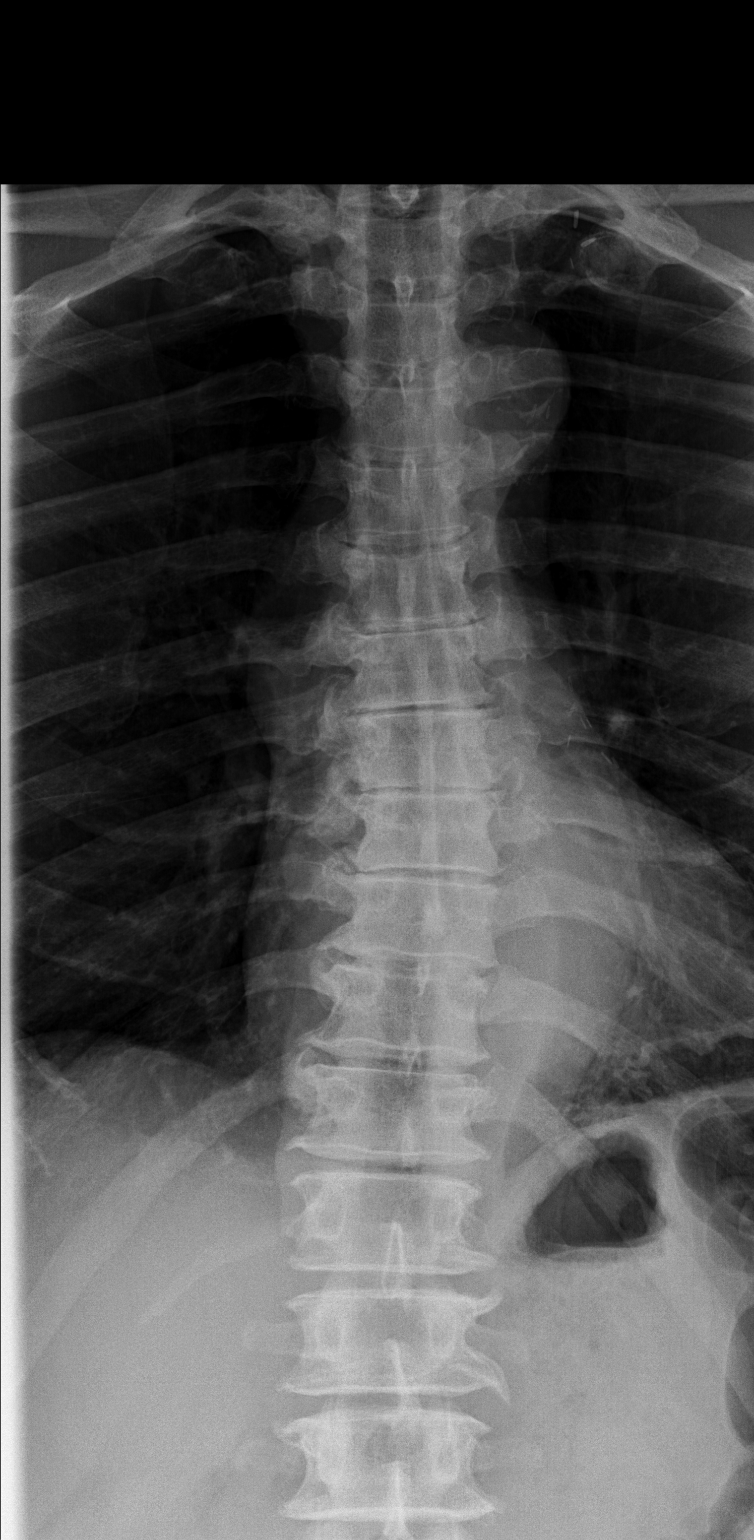

[w thoracic spine lat]
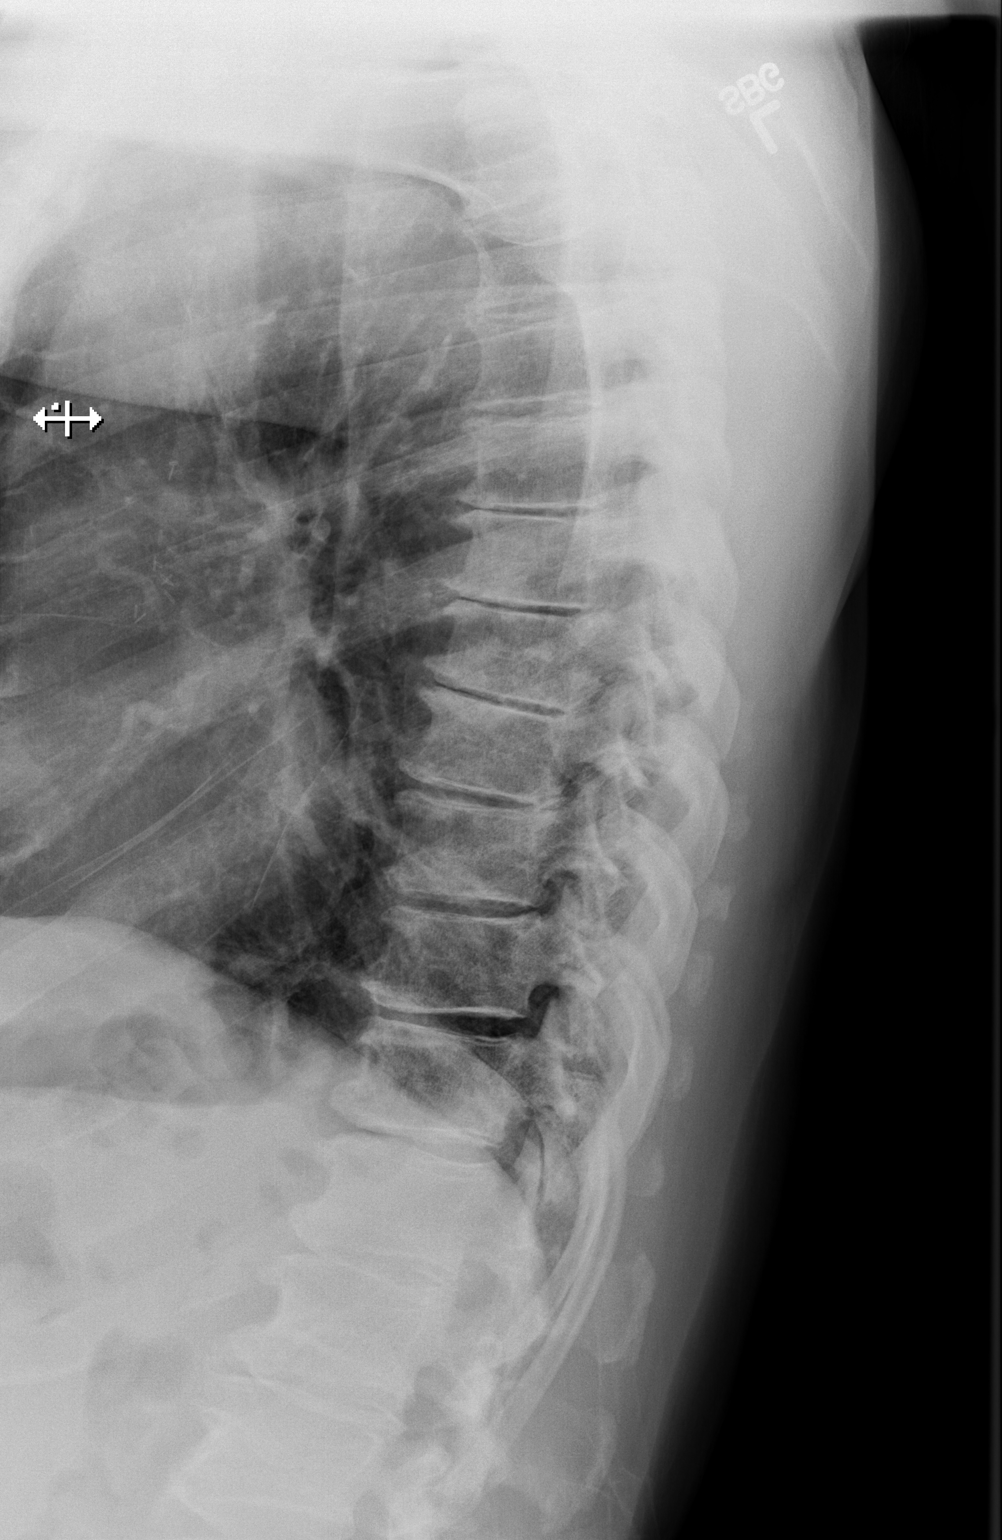

[w thoracic swimmers]
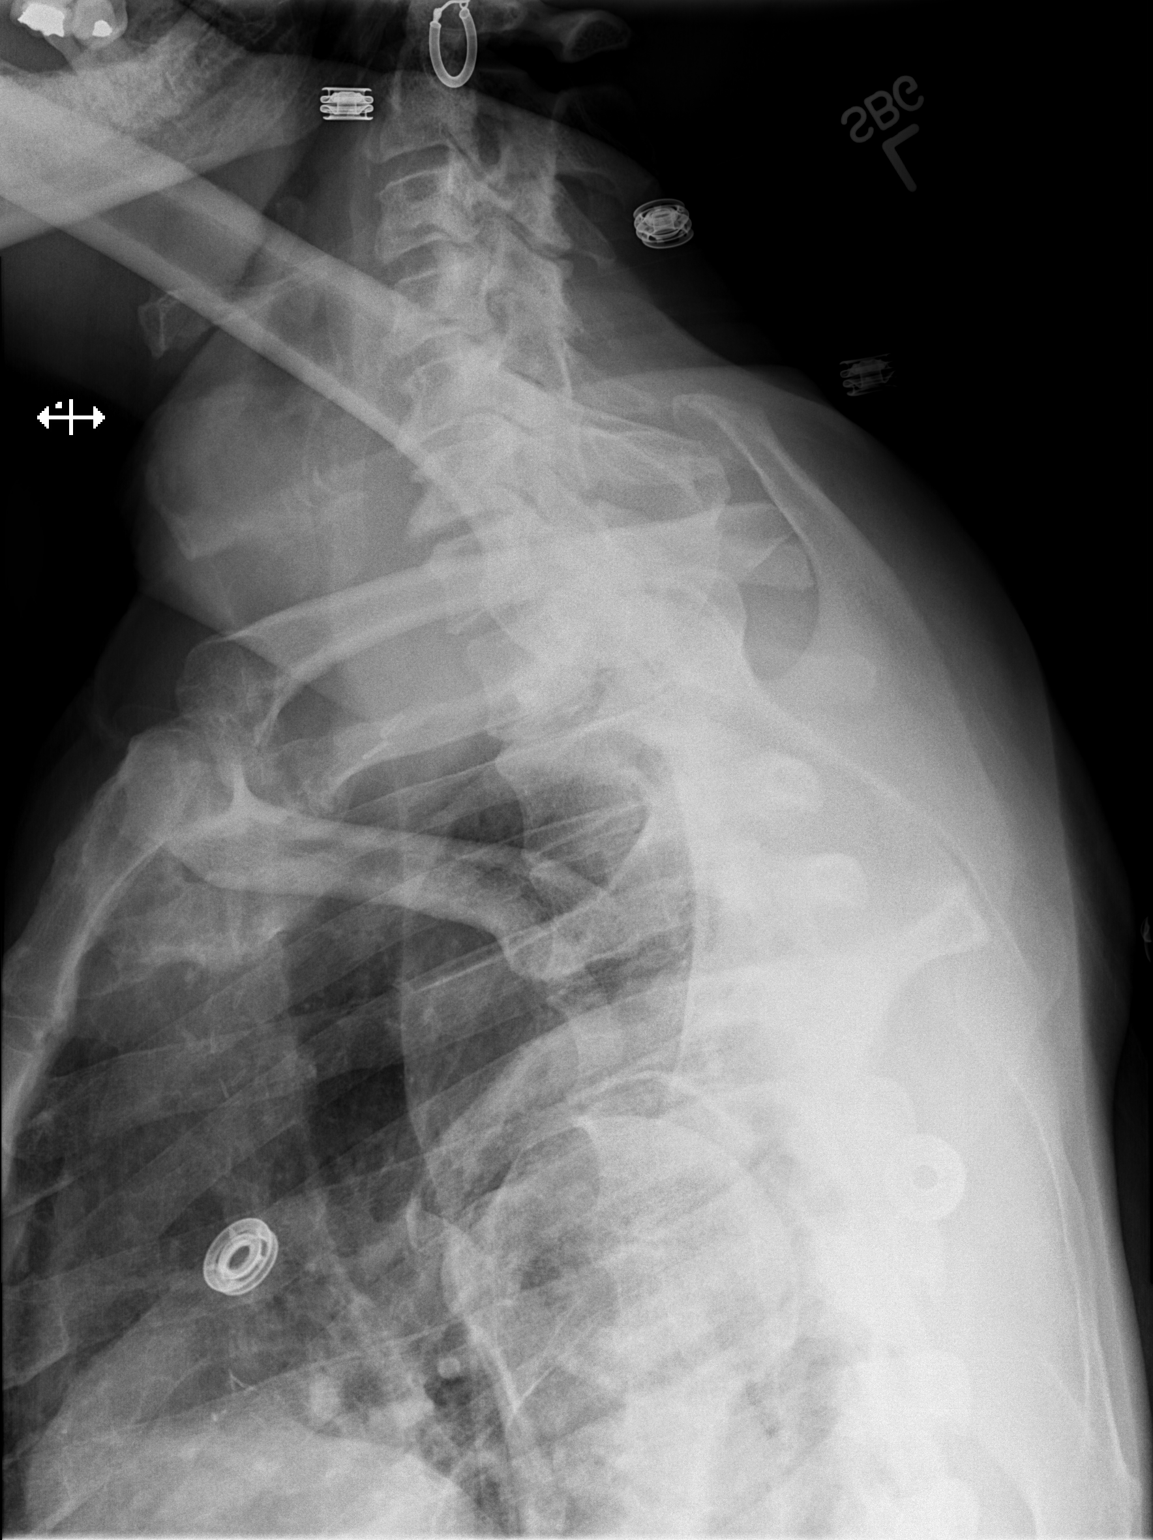

[3 of 3 positions shown; findings below may reference images not displayed]

FINDINGS: Thoracolumbar curvature convex to the left with the apex at T10.
Some straightening of the normal thoracic curvature. Degenerative
disc disease throughout the thoracic region with disc space
narrowing and chronic endplate changes. No evidence of recent
fracture or other acute finding.
IMPRESSION: Curvature and chronic degenerative changes affecting the thoracic
spine. No acute finding.

## 2017-02-17 DIAGNOSIS — R634 Abnormal weight loss: Secondary | ICD-10-CM

## 2017-02-17 HISTORY — DX: Abnormal weight loss: R63.4

## 2017-03-19 DIAGNOSIS — N401 Enlarged prostate with lower urinary tract symptoms: Secondary | ICD-10-CM | POA: Diagnosis not present

## 2017-03-19 DIAGNOSIS — R51 Headache: Secondary | ICD-10-CM | POA: Diagnosis not present

## 2017-03-19 DIAGNOSIS — R102 Pelvic and perineal pain: Secondary | ICD-10-CM | POA: Diagnosis not present

## 2017-03-19 DIAGNOSIS — L729 Follicular cyst of the skin and subcutaneous tissue, unspecified: Secondary | ICD-10-CM | POA: Diagnosis not present

## 2017-03-19 DIAGNOSIS — R630 Anorexia: Secondary | ICD-10-CM | POA: Diagnosis not present

## 2017-03-19 DIAGNOSIS — R1030 Lower abdominal pain, unspecified: Secondary | ICD-10-CM | POA: Diagnosis not present

## 2017-03-19 DIAGNOSIS — Z125 Encounter for screening for malignant neoplasm of prostate: Secondary | ICD-10-CM | POA: Diagnosis not present

## 2017-03-19 DIAGNOSIS — I25118 Atherosclerotic heart disease of native coronary artery with other forms of angina pectoris: Secondary | ICD-10-CM | POA: Diagnosis not present

## 2017-03-19 DIAGNOSIS — R3912 Poor urinary stream: Secondary | ICD-10-CM | POA: Diagnosis not present

## 2017-03-19 DIAGNOSIS — R110 Nausea: Secondary | ICD-10-CM | POA: Diagnosis not present

## 2017-04-23 DIAGNOSIS — L720 Epidermal cyst: Secondary | ICD-10-CM | POA: Diagnosis not present

## 2017-06-16 DIAGNOSIS — I252 Old myocardial infarction: Secondary | ICD-10-CM | POA: Diagnosis not present

## 2017-06-16 DIAGNOSIS — N400 Enlarged prostate without lower urinary tract symptoms: Secondary | ICD-10-CM | POA: Diagnosis not present

## 2017-06-16 DIAGNOSIS — R69 Illness, unspecified: Secondary | ICD-10-CM | POA: Diagnosis not present

## 2017-06-16 DIAGNOSIS — J45909 Unspecified asthma, uncomplicated: Secondary | ICD-10-CM | POA: Diagnosis not present

## 2017-06-16 DIAGNOSIS — I110 Hypertensive heart disease with heart failure: Secondary | ICD-10-CM | POA: Diagnosis not present

## 2017-06-16 DIAGNOSIS — Z8249 Family history of ischemic heart disease and other diseases of the circulatory system: Secondary | ICD-10-CM | POA: Diagnosis not present

## 2017-06-16 DIAGNOSIS — Z809 Family history of malignant neoplasm, unspecified: Secondary | ICD-10-CM | POA: Diagnosis not present

## 2017-06-16 DIAGNOSIS — I25119 Atherosclerotic heart disease of native coronary artery with unspecified angina pectoris: Secondary | ICD-10-CM | POA: Diagnosis not present

## 2017-06-16 DIAGNOSIS — E785 Hyperlipidemia, unspecified: Secondary | ICD-10-CM | POA: Diagnosis not present

## 2017-06-16 DIAGNOSIS — I509 Heart failure, unspecified: Secondary | ICD-10-CM | POA: Diagnosis not present

## 2017-07-23 DIAGNOSIS — I25118 Atherosclerotic heart disease of native coronary artery with other forms of angina pectoris: Secondary | ICD-10-CM | POA: Diagnosis not present

## 2017-07-23 DIAGNOSIS — L2084 Intrinsic (allergic) eczema: Secondary | ICD-10-CM | POA: Diagnosis not present

## 2017-07-23 DIAGNOSIS — N401 Enlarged prostate with lower urinary tract symptoms: Secondary | ICD-10-CM | POA: Diagnosis not present

## 2017-07-23 DIAGNOSIS — R630 Anorexia: Secondary | ICD-10-CM | POA: Diagnosis not present

## 2017-07-23 DIAGNOSIS — N529 Male erectile dysfunction, unspecified: Secondary | ICD-10-CM | POA: Diagnosis not present

## 2017-07-23 DIAGNOSIS — R3912 Poor urinary stream: Secondary | ICD-10-CM | POA: Diagnosis not present

## 2017-07-23 DIAGNOSIS — J310 Chronic rhinitis: Secondary | ICD-10-CM | POA: Diagnosis not present

## 2017-07-23 DIAGNOSIS — Z1211 Encounter for screening for malignant neoplasm of colon: Secondary | ICD-10-CM | POA: Diagnosis not present

## 2017-07-23 DIAGNOSIS — R5381 Other malaise: Secondary | ICD-10-CM | POA: Diagnosis not present

## 2017-07-23 DIAGNOSIS — R102 Pelvic and perineal pain: Secondary | ICD-10-CM | POA: Diagnosis not present

## 2017-07-23 DIAGNOSIS — R634 Abnormal weight loss: Secondary | ICD-10-CM | POA: Diagnosis not present

## 2017-08-26 DIAGNOSIS — R3912 Poor urinary stream: Secondary | ICD-10-CM | POA: Diagnosis not present

## 2017-08-26 DIAGNOSIS — N401 Enlarged prostate with lower urinary tract symptoms: Secondary | ICD-10-CM | POA: Diagnosis not present

## 2017-08-26 DIAGNOSIS — R972 Elevated prostate specific antigen [PSA]: Secondary | ICD-10-CM | POA: Diagnosis not present

## 2017-09-10 DIAGNOSIS — R3912 Poor urinary stream: Secondary | ICD-10-CM | POA: Diagnosis not present

## 2017-09-10 DIAGNOSIS — N401 Enlarged prostate with lower urinary tract symptoms: Secondary | ICD-10-CM | POA: Diagnosis not present

## 2017-09-15 DIAGNOSIS — R1310 Dysphagia, unspecified: Secondary | ICD-10-CM | POA: Diagnosis not present

## 2017-09-15 DIAGNOSIS — R634 Abnormal weight loss: Secondary | ICD-10-CM | POA: Diagnosis not present

## 2017-09-15 DIAGNOSIS — R630 Anorexia: Secondary | ICD-10-CM | POA: Diagnosis not present

## 2017-09-15 DIAGNOSIS — Z800 Family history of malignant neoplasm of digestive organs: Secondary | ICD-10-CM | POA: Diagnosis not present

## 2018-02-13 ENCOUNTER — Emergency Department (HOSPITAL_COMMUNITY)
Admission: EM | Admit: 2018-02-13 | Discharge: 2018-02-13 | Disposition: A | Discharge status: Home / Self Care | Payer: Medicare HMO | Attending: Emergency Medicine | Admitting: Emergency Medicine

## 2018-02-13 ENCOUNTER — Emergency Department (HOSPITAL_COMMUNITY): Payer: Medicare HMO

## 2018-02-13 ENCOUNTER — Encounter (HOSPITAL_COMMUNITY): Payer: Self-pay | Admitting: Emergency Medicine

## 2018-02-13 DIAGNOSIS — J189 Pneumonia, unspecified organism: Secondary | ICD-10-CM | POA: Diagnosis not present

## 2018-02-13 DIAGNOSIS — Z79899 Other long term (current) drug therapy: Secondary | ICD-10-CM | POA: Diagnosis not present

## 2018-02-13 DIAGNOSIS — I2510 Atherosclerotic heart disease of native coronary artery without angina pectoris: Secondary | ICD-10-CM | POA: Insufficient documentation

## 2018-02-13 DIAGNOSIS — R69 Illness, unspecified: Secondary | ICD-10-CM | POA: Diagnosis not present

## 2018-02-13 DIAGNOSIS — F17200 Nicotine dependence, unspecified, uncomplicated: Secondary | ICD-10-CM | POA: Diagnosis not present

## 2018-02-13 DIAGNOSIS — I10 Essential (primary) hypertension: Secondary | ICD-10-CM | POA: Insufficient documentation

## 2018-02-13 DIAGNOSIS — J441 Chronic obstructive pulmonary disease with (acute) exacerbation: Secondary | ICD-10-CM | POA: Insufficient documentation

## 2018-02-13 DIAGNOSIS — J181 Lobar pneumonia, unspecified organism: Secondary | ICD-10-CM | POA: Diagnosis not present

## 2018-02-13 DIAGNOSIS — R079 Chest pain, unspecified: Secondary | ICD-10-CM | POA: Diagnosis not present

## 2018-02-13 DIAGNOSIS — Z7902 Long term (current) use of antithrombotics/antiplatelets: Secondary | ICD-10-CM | POA: Diagnosis not present

## 2018-02-13 DIAGNOSIS — J0191 Acute recurrent sinusitis, unspecified: Secondary | ICD-10-CM | POA: Diagnosis not present

## 2018-02-13 DIAGNOSIS — J019 Acute sinusitis, unspecified: Secondary | ICD-10-CM | POA: Diagnosis not present

## 2018-02-13 LAB — CBC
HEMATOCRIT: 45.9 % (ref 39.0–52.0)
HEMOGLOBIN: 14.4 g/dL (ref 13.0–17.0)
MCH: 28.3 pg (ref 26.0–34.0)
MCHC: 31.4 g/dL (ref 30.0–36.0)
MCV: 90.2 fL (ref 80.0–100.0)
Platelets: 203 10*3/uL (ref 150–400)
RBC: 5.09 MIL/uL (ref 4.22–5.81)
RDW: 15.1 % (ref 11.5–15.5)
WBC: 4.3 10*3/uL (ref 4.0–10.5)
nRBC: 0 % (ref 0.0–0.2)

## 2018-02-13 LAB — BASIC METABOLIC PANEL
Anion gap: 8 (ref 5–15)
BUN: 12 mg/dL (ref 8–23)
CHLORIDE: 106 mmol/L (ref 98–111)
CO2: 26 mmol/L (ref 22–32)
Calcium: 8.7 mg/dL — ABNORMAL LOW (ref 8.9–10.3)
Creatinine, Ser: 1.11 mg/dL (ref 0.61–1.24)
GFR calc Af Amer: >60 mL/min (ref >60)
GFR calc non Af Amer: >60 mL/min (ref >60)
Glucose, Bld: 96 mg/dL (ref 70–99)
POTASSIUM: 3.8 mmol/L (ref 3.5–5.1)
SODIUM: 140 mmol/L (ref 135–145)

## 2018-02-13 LAB — I-STAT TROPONIN, ED: Troponin i, poc: 0.01 ng/mL (ref 0.00–0.08)

## 2018-02-13 MED ORDER — IPRATROPIUM BROMIDE 0.02 % IN SOLN
0.5000 mg | Freq: Once | RESPIRATORY_TRACT | Status: AC
  Administered 2018-02-13: 0.5 mg via RESPIRATORY_TRACT
  Filled 2018-02-13: qty 2.5

## 2018-02-13 MED ORDER — ALBUTEROL SULFATE (2.5 MG/3ML) 0.083% IN NEBU
5.0000 mg | INHALATION_SOLUTION | Freq: Once | RESPIRATORY_TRACT | Status: AC
  Administered 2018-02-13: 5 mg via RESPIRATORY_TRACT
  Filled 2018-02-13: qty 6

## 2018-02-13 MED ORDER — AMOXICILLIN-POT CLAVULANATE ER 1000-62.5 MG PO TB12
2.0000 | ORAL_TABLET | Freq: Two times a day (BID) | ORAL | Status: DC
  Administered 2018-02-13: 2 via ORAL
  Filled 2018-02-13 (×2): qty 2

## 2018-02-13 MED ORDER — ALBUTEROL SULFATE HFA 108 (90 BASE) MCG/ACT IN AERS
2.0000 | INHALATION_SPRAY | RESPIRATORY_TRACT | Status: DC
  Administered 2018-02-13: 2 via RESPIRATORY_TRACT
  Filled 2018-02-13: qty 6.7

## 2018-02-13 MED ORDER — HYDROCHLOROTHIAZIDE 25 MG PO TABS
25.0000 mg | ORAL_TABLET | Freq: Every day | ORAL | Status: DC
  Administered 2018-02-13: 25 mg via ORAL
  Filled 2018-02-13: qty 1

## 2018-02-13 MED ORDER — PREDNISONE 20 MG PO TABS
60.0000 mg | ORAL_TABLET | Freq: Once | ORAL | Status: AC
  Administered 2018-02-13: 60 mg via ORAL
  Filled 2018-02-13: qty 3

## 2018-02-13 MED ORDER — PREDNISONE 10 MG (21) PO TBPK: ORAL_TABLET | ORAL | 0 refills | Status: DC

## 2018-02-13 MED ORDER — METHYLPREDNISOLONE SODIUM SUCC 125 MG IJ SOLR
125.0000 mg | Freq: Once | INTRAMUSCULAR | Status: DC
  Filled 2018-02-13: qty 2

## 2018-02-13 MED ORDER — AMOXICILLIN-POT CLAVULANATE ER 1000-62.5 MG PO TB12: 2.0000 | ORAL_TABLET | Freq: Two times a day (BID) | ORAL | 0 refills | Status: AC

## 2018-02-13 MED ORDER — METOPROLOL TARTRATE 25 MG PO TABS
25.0000 mg | ORAL_TABLET | Freq: Once | ORAL | Status: AC
  Administered 2018-02-13: 25 mg via ORAL
  Filled 2018-02-13: qty 1

## 2018-02-13 NOTE — ED Provider Notes (Signed)
[facility] EMERGENCY DEPARTMENT Provider Note   CSN: [id number] Arrival date & time: 02/13/18  0035     History   Chief Complaint Chief Complaint  Patient presents with  . Chest Pain    HPI Shane Carrillo is a 72 y.o. male with history of uncontrolled hypertension, sinusitis, CAD s/p LIMA to LAD in 2008 who presents to the emergency department from home with a chief complaint of chest tightness with associated productive cough with clear sputum and wheezing.  States "It feels tight around my lungs." States he had an episode of chills 2 days ago, which is since resolved.   He also endorses a posterior headache for the last week and a half with sinus pain and pressure and postnasal drip. He has not checked his temperature at home.    He denies chest pain, palpitations, leg swelling, orthopnea, nausea, vomiting, diarrhea, dizziness, weakness, numbness abdominal pain, back pain.  He is a current 1 pack/day smoker for "way too long."  He treated his symptoms with 1 albuterol nebulizer treatment 3 days ago with some improvement in his symptoms.   He hasn't taken his home blood pressure medications in 6 days. He had a normal stress echo in November 2018.  He did not receive a flu shot this year. He reports he has been around his grandchild who is been sick with a viral illness.  He did not receive a flu shot this year.  The history is provided by the patient. No language interpreter was used.    Past Medical History:  Diagnosis Date  . Coronary artery disease   . Hypertension     There are no active problems to display for this patient.   Past Surgical History:  Procedure Laterality Date  . CORONARY ANGIOPLASTY WITH STENT PLACEMENT          Home Medications    Prior to Admission medications   Medication Sig Start Date End Date Taking? Authorizing Provider  clopidogrel (PLAVIX) 75 MG tablet Take 75 mg by mouth at bedtime.   Yes [provider]    dutasteride (AVODART) 0.5 MG capsule Take 0.5 mg by mouth daily.   Yes [provider]  famotidine (PEPCID) 40 MG tablet Take 40 mg by mouth at bedtime.   Yes [provider]  gabapentin (NEURONTIN) 300 MG capsule Take 300 mg by mouth 3 (three) times daily as needed (for pain).    Yes [provider]  hydrochlorothiazide (HYDRODIURIL) 25 MG tablet Take 1 tablet (25 mg total) by mouth daily. 01/13/16  Yes Mabe, Forbes Cellar, MD  metoprolol tartrate (LOPRESSOR) 25 MG tablet Take 25 mg by mouth 2 (two) times daily.   Yes [provider]  nitroGLYCERIN (NITROSTAT) 0.4 MG SL tablet Place 0.4 mg under the tongue every 5 (five) minutes as needed for chest pain.   Yes [provider]  ranolazine (RANEXA) 1000 MG SR tablet Take 1,000 mg by mouth 2 (two) times daily.   Yes [provider]  rosuvastatin (CRESTOR) 20 MG tablet Take 20 mg by mouth at bedtime.   Yes [provider]  amoxicillin-clavulanate (AUGMENTIN XR) 1000-62.5 MG 12 hr tablet Take 2 tablets by mouth 2 (two) times daily for 5 days. 02/13/18 02/18/18  Vernesha Talbot A, PA-C  predniSONE (STERAPRED UNI-PAK 21 TAB) 10 MG (21) TBPK tablet Take 6 tabs by mouth daily x2 days, then 5 tabs x2 days, then 4 tabs x2 days, x3 tabs for 2 days, 2  tabs x2 days, then 1 tab x2 days 02/13/18   Joline Maxcy A, PA-C    Family History No family history on file.  Social History Social History   Tobacco Use  . Smoking status: Current Some Day Smoker  . Smokeless tobacco: Never Used  Substance Use Topics  . Alcohol use: Yes  . Drug use: No     Allergies   Aspirin   Review of Systems Review of Systems  Constitutional: Positive for chills. Negative for appetite change and fever.  HENT: Positive for postnasal drip, sinus pressure and sinus pain.   Respiratory: Positive for cough, shortness of breath and wheezing.   Cardiovascular: Negative for chest pain.  Gastrointestinal: Negative for  abdominal pain, diarrhea, nausea and vomiting.  Genitourinary: Negative for dysuria.  Musculoskeletal: Negative for back pain.  Skin: Negative for rash.  Allergic/Immunologic: Negative for immunocompromised state.  Neurological: Positive for headaches. Negative for dizziness, syncope, weakness and numbness.  Psychiatric/Behavioral: Negative for confusion.     Physical Exam Updated Vital Signs BP (!) 186/96   Pulse 60   Temp 98.3 F (36.8 C) (Oral)   Resp (!) 24   Ht 5\' 5"  (1.651 m)   Wt 67.1 kg   SpO2 100%   BMI 24.63 kg/m   Physical Exam Vitals signs and nursing note reviewed.  Constitutional:      Appearance: He is well-developed.     Comments: Well appearing. Appears much younger than stated age.   HENT:     Head: Normocephalic.     Nose: Congestion present.  Eyes:     Conjunctiva/sclera: Conjunctivae normal.  Neck:     Musculoskeletal: Normal range of motion and neck supple. No neck rigidity or muscular tenderness.  Cardiovascular:     Rate and Rhythm: Normal rate and regular rhythm.     Heart sounds: No murmur.  Pulmonary:     Effort: Pulmonary effort is normal.     Breath sounds: Wheezing present.     Comments: Diffuse inspiratory and expiratory wheezes throughout Abdominal:     General: There is no distension.     Palpations: Abdomen is soft. There is no mass.     Tenderness: There is no abdominal tenderness.     Hernia: No hernia is present.  Skin:    General: Skin is warm and dry.  Neurological:     Mental Status: He is alert.  Psychiatric:        Behavior: Behavior normal.      ED Treatments / Results  Labs (all labs ordered are listed, but only abnormal results are displayed) Labs Reviewed  BASIC METABOLIC PANEL - Abnormal; Notable for the following components:      Result Value   Calcium 8.7 (*)    All other components within normal limits  CBC  I-STAT TROPONIN, ED    EKG EKG Interpretation  Date/Time:  Saturday February 13 2018  05:48:08 EST Ventricular Rate:  60 PR Interval:    QRS Duration: 85 QT Interval:  393 QTC Calculation: 393 R Axis:   56 Text Interpretation:  Sinus rhythm LVH with secondary repolarization abnormality Anterior Q waves, possibly due to LVH ST/T changes similar to April 2018 Confirmed by Sherwood Gambler [phone]) on 02/13/2018 5:53:00 AM   Radiology Dg Chest 2 View  Result Date: 02/13/2018 CLINICAL DATA:  Acute onset of right-sided chest pain and wheezing. EXAM: CHEST - 2 VIEW COMPARISON:  Chest radiograph and CT of the chest performed 01/06/2017 FINDINGS: The lungs are  well-aerated. Minimal left basilar opacity likely reflects atelectasis. There is no evidence of pleural effusion or pneumothorax. The heart is normal in size; the mediastinal contour is within normal limits. No acute osseous abnormalities are seen. There is chronic resection or resorption of the distal left clavicle. IMPRESSION: Minimal left basilar opacity likely reflects atelectasis; lungs otherwise clear. Electronically Signed   By: Garald Balding M.D.   On: 02/13/2018 01:48    Procedures Procedures (including critical care time)  Medications Ordered in ED Medications  metoprolol tartrate (LOPRESSOR) tablet 25 mg (25 mg Oral Given 02/13/18 0642)  albuterol (PROVENTIL) (2.5 MG/3ML) 0.083% nebulizer solution 5 mg (5 mg Nebulization Given 02/13/18 0639)  ipratropium (ATROVENT) nebulizer solution 0.5 mg (0.5 mg Nebulization Given 02/13/18 0640)  predniSONE (DELTASONE) tablet 60 mg (60 mg Oral Given 02/13/18 0801)  albuterol (PROVENTIL) (2.5 MG/3ML) 0.083% nebulizer solution 5 mg (5 mg Nebulization Given 02/13/18 7846)     Initial Impression / Assessment and Plan / ED Course  I have reviewed the triage vital signs and the nursing notes.  Pertinent labs & imaging results that were available during my care of the patient were reviewed by me and considered in my medical decision making (see chart for details).  72 year old  male with history of uncontrolled hypertension, sinusitis, CAD s/p LIMA to LAD in 2008 senting with chest pain, wheezing, and shortness of breath for the last few days.  No flulike symptoms.  Patient was hypertensive on arrival, but has not been compliant with his home medications for almost a week.  His home medications have been ordered.   Chest x-ray with minimal left basilar opacity that likely reflects atelectasis versus pneumonia.  He also has been having symptoms consistent with sinusitis.  EKG unchanged from April 2018.  Troponin is negative and labs are otherwise reassuring.  Clinical Course as of Feb 13 1645  Sat Feb 13, 2018  0811 Patient recheck.  He still endorses some chest tightness, but reports moderate improvement.  On exam, diffuse wheezing throughout.  Will repeat nebulizer then plan to ambulate the patient on pulse ox.  The patient has requested the first dose of his antibiotics be given in the ER.  He is also in need of a an albuterol inhaler as he only has the nebulizer solution at home.  His home HCTZ has also been ordered.   [MM]    Clinical Course User Index [MM] Keisha Amer A, PA-C   Recommended follow-up with his PCP in the next 3 to 4 days for recheck.  Encourage the patient to remain compliant with his home antihypertensives.  He was also given strict return precautions to the emergency department.  Blood pressure improving afte being treated with his home medications.  Low suspicion for hypertensive urgency or emergency, PE, or ACS at this time.  He is hemodynamically stable and in no acute distress.  He is safe for discharge home with outpatient follow-up at this time.   Final Clinical Impressions(s) / ED Diagnoses   Final diagnoses:  COPD exacerbation ([place])  Acute recurrent sinusitis, unspecified location  Community acquired pneumonia of left lower lobe of lung [facility])    ED Discharge Orders         Ordered    amoxicillin-clavulanate (AUGMENTIN XR) 1000-62.5  MG 12 hr tablet  2 times daily     02/13/18 0818    predniSONE (STERAPRED UNI-PAK 21 TAB) 10 MG (21) TBPK tablet     02/13/18 0818  Joline Maxcy A, PA-C 02/13/18 1646    Sherwood Gambler, MD 02/14/18 1406

## 2018-02-13 NOTE — ED Notes (Signed)
Attempted to gain IV access for med administration. Pt agitated and restless during venipuncture attempt. Unsuccessful attempt, pt refusing additional attempts. Requesting PO meds only.

## 2018-02-13 NOTE — ED Triage Notes (Signed)
Reports right sided chest pain since christmas eve.  Noted to be wheezy in triage.  Also reports pain in back and around his lungs as well as a headache.  Using nebulizer at home.

## 2018-02-13 NOTE — Discharge Instructions (Signed)
Thank you for allowing me to care for you today in the Emergency Department.   Please follow-up with primary care for a recheck of your symptoms within the next week.  Starting tomorrow, take 6 tabs of prednisone by mouth daily  for 2 days, then 5 tabs for 2 days, then 4 tabs for 2 days, then 3 tabs for 2 days, 2 tabs for 2 days, then 1 tab by mouth daily for 2 days.  Your first dose was given today in the emergency department.  Take 2 tablets of Augmentin 2 times daily for the next 5 days starting tonight.  Your first dose was given this morning in the emergency department.  Use 2 puffs of your albuterol inhaler or 1 albuterol nebulizer every 4 hours as needed for chest tightness, wheezing, or shortness of breath.  Please make sure to continue to take your home medications, including her blood pressure medication.  Return to the emergency department if you develop worsening shortness of breath, high fever, severe chest pain, respiratory distress, or other new, concerning symptoms.

## 2018-02-18 ENCOUNTER — Institutional Professional Consult (permissible substitution): Payer: Medicare HMO | Admitting: Internal Medicine

## 2018-02-25 ENCOUNTER — Ambulatory Visit (INDEPENDENT_AMBULATORY_CARE_PROVIDER_SITE_OTHER): Payer: Medicare HMO | Admitting: Internal Medicine

## 2018-02-25 ENCOUNTER — Encounter: Payer: Self-pay | Admitting: Internal Medicine

## 2018-02-25 VITALS — BP 142/80 | HR 110 | Ht 64.0 in | Wt 136.0 lb

## 2018-02-25 DIAGNOSIS — R0609 Other forms of dyspnea: Secondary | ICD-10-CM | POA: Diagnosis not present

## 2018-02-25 DIAGNOSIS — F17210 Nicotine dependence, cigarettes, uncomplicated: Secondary | ICD-10-CM

## 2018-02-25 DIAGNOSIS — R69 Illness, unspecified: Secondary | ICD-10-CM | POA: Diagnosis not present

## 2018-02-25 LAB — HEPATIC FUNCTION PANEL
ALT: 18 U/L (ref 0–53)
AST: 14 U/L (ref 0–37)
Albumin: 4.5 g/dL (ref 3.5–5.2)
Alkaline Phosphatase: 52 U/L (ref 39–117)
Bilirubin, Direct: 0.1 mg/dL (ref 0.0–0.3)
Total Bilirubin: 0.4 mg/dL (ref 0.2–1.2)
Total Protein: 7.3 g/dL (ref 6.0–8.3)

## 2018-02-25 LAB — CBC WITH DIFFERENTIAL/PLATELET
Basophils Absolute: 0 10*3/uL (ref 0.0–0.1)
Basophils Relative: 0.3 % (ref 0.0–3.0)
Eosinophils Absolute: 0 10*3/uL (ref 0.0–0.7)
Eosinophils Relative: 0.1 % (ref 0.0–5.0)
HCT: 50.7 % (ref 39.0–52.0)
Hemoglobin: 16.6 g/dL (ref 13.0–17.0)
Lymphocytes Relative: 20.1 % (ref 12.0–46.0)
Lymphs Abs: 1.6 10*3/uL (ref 0.7–4.0)
MCHC: 32.8 g/dL (ref 30.0–36.0)
MCV: 87.2 fl (ref 78.0–100.0)
Monocytes Absolute: 0.6 10*3/uL (ref 0.1–1.0)
Monocytes Relative: 7.4 % (ref 3.0–12.0)
Neutro Abs: 5.9 10*3/uL (ref 1.4–7.7)
Neutrophils Relative %: 72.1 % (ref 43.0–77.0)
Platelets: 335 10*3/uL (ref 150.0–400.0)
RBC: 5.82 Mil/uL — ABNORMAL HIGH (ref 4.22–5.81)
RDW: 14.5 % (ref 11.5–15.5)
WBC: 8.2 10*3/uL (ref 4.0–10.5)

## 2018-02-25 LAB — D-DIMER, QUANTITATIVE: D-Dimer, Quant: 0.42 mcg/mL FEU (ref <0.50)

## 2018-02-25 LAB — TSH: TSH: 0.33 u[IU]/mL — ABNORMAL LOW (ref 0.35–4.50)

## 2018-02-25 LAB — BRAIN NATRIURETIC PEPTIDE: PRO B NATRI PEPTIDE: 66 pg/mL (ref 0.0–100.0)

## 2018-02-25 LAB — SEDIMENTATION RATE: Sed Rate: 23 mm/hr — ABNORMAL HIGH (ref 0–20)

## 2018-02-25 NOTE — Progress Notes (Signed)
Shane Carrillo, male    DOB: Jul 04, 1945,     MRN: [id number]    Brief patient profile:  40 yobm active smoker with new indolent onset progessive doe x 2017 much worse since April 2019 assoc with severe cough more day than noct > to ER 02/13/18 rx neb helped / pred / amox and self referred to pulmonary office 02/25/2018  With nl spirometry on initial eval   History of Present Illness  02/25/2018  Pulmonary/ 1st office eval/  Chief Complaint  Patient presents with  . Pulmonary Consult    Self referral. Pt c/o DOE x 2 years, worse since April 2019.  He gets winded washing his car or doing chores around the house. He also reports loss of appetite and unintended wt loss.   Dyspnea:  MMRC2 = can't walk a nl pace on a flat grade s sob but does fine slow and flat  Cough: day > noct more dry than wet  Sleep: poor sleep but not due to wheezing / sob  SABA use: not sure it helps    No obvious day to day or daytime variability or assoc excess/ purulent sputum or mucus plugs or hemoptysis or cp or chest tightness, subjective wheeze or overt sinus or hb symptoms.   Sleeping flat without nocturnal  or early am exacerbation  of respiratory  c/o's or need for noct saba. Also denies any obvious fluctuation of symptoms with weather or environmental changes or other aggravating or alleviating factors except as outlined above   No unusual exposure hx or h/o childhood pna/ asthma or knowledge of premature birth.  Current Allergies, Complete Past Medical History, Past Surgical History, Family History, and Social History were reviewed in [employer] record.  ROS  The following are not active complaints unless bolded Hoarseness, sore throat, dysphagia, dental problems, itching, sneezing,  nasal congestion or discharge of excess mucus or purulent secretions, ear ache,   fever, chills, sweats, unintended wt loss or wt gain, classically pleuritic or exertional cp,  orthopnea pnd or  arm/hand swelling  or leg swelling, presyncope, palpitations, abdominal pain, anorexia, nausea, vomiting, diarrhea  or change in bowel habits or change in bladder habits, change in stools or change in urine, dysuria, hematuria,  rash, arthralgias, visual complaints, headache, numbness, weakness or ataxia or problems with walking or coordination,  change in mood or  memory.           Past Medical History:  Diagnosis Date  . Coronary artery disease   . Hypertension     Outpatient Medications Prior to Visit  Medication Sig Dispense Refill  . clopidogrel (PLAVIX) 75 MG tablet Take 75 mg by mouth at bedtime.    . dutasteride (AVODART) 0.5 MG capsule Take 0.5 mg by mouth Carrillo.    . famotidine (PEPCID) 40 MG tablet Take 40 mg by mouth at bedtime as needed.     . gabapentin (NEURONTIN) 300 MG capsule Take 300 mg by mouth 3 (three) times Carrillo as needed (for pain).     . hydrochlorothiazide (HYDRODIURIL) 25 MG tablet Take 1 tablet (25 mg total) by mouth Carrillo. 30 tablet 0  . metoprolol tartrate (LOPRESSOR) 25 MG tablet Take 25 mg by mouth 2 (two) times Carrillo.    . nitroGLYCERIN (NITROSTAT) 0.4 MG SL tablet Place 0.4 mg under the tongue every 5 (five) minutes as needed for chest pain.    . ranolazine (RANEXA) 1000 MG SR tablet Take 1,000 mg by mouth 2 (  two) times Carrillo.    . rosuvastatin (CRESTOR) 20 MG tablet Take 20 mg by mouth at bedtime.    . predniSONE (STERAPRED UNI-PAK 21 TAB) 10 MG (21) TBPK tablet Take 6 tabs by mouth Carrillo x2 days, then 5 tabs x2 days, then 4 tabs x2 days, x3 tabs for 2 days, 2 tabs x2 days, then 1 tab x2 days 42 tablet 0     Objective:     BP (!) 142/80 (BP Location: Left Arm, Cuff Size: Normal)   Pulse (!) 110   Ht 5\' 4"  (1.626 m)   Wt 136 lb (61.7 kg)   SpO2 95%   BMI 23.34 kg/m   SpO2: 95 %  RA  Thin chronically ill amb wm nad    Wt Readings from Last 3 Encounters:  02/25/18 136 lb (61.7 kg)  02/13/18 148 lb (67.1 kg)  02/14/16 156 lb 7 oz (71 kg)       HEENT: Poor  dentition, nl turbinates bilaterally, and oropharynx. Nl external ear canals without cough reflex   NECK :  without JVD/Nodes/TM/ nl carotid upstrokes bilaterally   LUNGS: no acc muscle use,  Nl contour chest which is clear to A and P bilaterally without cough on insp or exp maneuvers   CV:  RRR  no s3 or murmur or increase in P2, and no edema   ABD:  soft and nontender with nl inspiratory excursion in the supine position. No bruits or organomegaly appreciated, bowel sounds nl  MS:  Nl gait/ ext warm without deformities, calf tenderness, cyanosis or clubbing No obvious joint restrictions   SKIN: warm and dry without lesions    NEURO:  alert, approp, nl sensorium with  no motor or cerebellar deficits apparent.      I personally reviewed images and agree with radiology impression as follows:  CXR:   02/13/18 Minimal left basilar opacity likely reflects atelectasis; lungs otherwise clear.   Labs ordered/ reviewed:      Chemistry      Component Value Date/Time   NA 140 02/13/2018 0111   K 3.8 02/13/2018 0111   CL 106 02/13/2018 0111   CO2 26 02/13/2018 0111   BUN 12 02/13/2018 0111   CREATININE 1.11 02/13/2018 0111      Component Value Date/Time   CALCIUM 8.7 (L) 02/13/2018 0111   ALKPHOS 52 02/25/2018 1510   AST 14 02/25/2018 1510   ALT 18 02/25/2018 1510   BILITOT 0.4 02/25/2018 1510        Lab Results  Component Value Date   WBC 8.2 02/25/2018   HGB 16.6 02/25/2018   HCT 50.7 02/25/2018   MCV 87.2 02/25/2018   PLT 335.0 02/25/2018       EOS                                                               0.1                                    02/25/2018   Lab Results  Component Value Date   DDIMER 0.42 02/25/2018      Lab Results  Component Value Date   TSH 0.33 (L) 02/25/2018  Lab Results  Component Value Date   PROBNP 66.0 02/25/2018       Lab Results  Component Value Date   ESRSEDRATE 23 (H) 02/25/2018                Assessment   No problem-specific Assessment & Plan notes found for this encounter.     Christinia Gully, MD 02/25/2018

## 2018-02-25 NOTE — Patient Instructions (Addendum)
Please remember to go to the lab department   for your tests - we will call you with the results when they are available.      The key is to stop smoking completely before smoking completely stops you!  For smoking cessation classes call  [phone]       Please schedule a follow up office visit in 4 weeks, sooner if needed  with all medications /inhalers/ solutions in hand so we can verify exactly what you are taking. This includes all medications from all doctors and over the counters - full pfts on return

## 2018-02-26 ENCOUNTER — Other Ambulatory Visit: Payer: Self-pay | Admitting: Internal Medicine

## 2018-02-26 ENCOUNTER — Encounter: Payer: Self-pay | Admitting: Internal Medicine

## 2018-02-26 DIAGNOSIS — F17210 Nicotine dependence, cigarettes, uncomplicated: Secondary | ICD-10-CM

## 2018-02-26 DIAGNOSIS — R0609 Other forms of dyspnea: Principal | ICD-10-CM

## 2018-02-26 HISTORY — DX: Nicotine dependence, cigarettes, uncomplicated: F17.210

## 2018-02-26 NOTE — Assessment & Plan Note (Signed)
4-5 min discussion re active cigarette smoking in addition to office E&M  Ask about tobacco use:   ongoing Advise quitting     I reviewed the Fletcher curve with the patient that basically indicates  if you quit smoking when your best day FEV1 is still well preserved (as is clearly  the case here)  it is highly unlikely you will progress to severe disease and informed the patient there was  no medication on the market that has proven to alter the curve/ its downward trajectory  or the likelihood of progression of their disease(unlike other chronic medical conditions such as atheroclerosis where we do think we can change the natural hx with risk reducing meds)    Therefore stopping smoking and maintaining abstinence are  the most important aspects of care, not choice of inhalers or for that matter, doctors. Treatment other than smoking cessation  is entirely directed by severity of symptoms and focused also on reducing exacerbations, not attempting to change the natural history of the disease. As having no symptoms attributable to copd and no ab flares, no need for rx at this point other than "clean air"    Assess willingness:  Not committed at this point Assist in quit attempt:  Per PCP when ready Arrange follow up:   Follow up per Primary Care planned  For smoking cessation classes call [phone]      Total time devoted to counseling  > 50 % of initial 60 min office visit:  review case with pt/observing ambulatory 02 study / discussion of options/alternatives/ personally creating written customized instructions  in presence of pt  then going over those specific  Instructions directly with the pt including how to use all of the meds but in particular covering each new medication in detail and the difference between the maintenance= "automatic" meds and the prns using an action plan format for the latter (If this problem/symptom => do that organization reading Left to right).  Please see AVS from this  visit for a full list of these instructions which I personally wrote for this pt and  are unique to this visit.

## 2018-02-26 NOTE — Progress Notes (Signed)
Spoke with pt and notified of results per Dr. Melvyn Novas. Pt verbalized understanding and denied any questions. Labs ordered

## 2018-02-26 NOTE — Assessment & Plan Note (Signed)
Onset around 2017 - Spirometry 02/25/2018  Nl s curvature s prior rx - 02/25/2018   Walked RA  2 laps @ 241ft each @ mod fast pace  stopped due to  Fatigue > sob  - Labs ok 02/25/2018 x for TSH 0.33 > w/u rec since assoc with wt loss   Symptoms are markedly disproportionate to objective findings and not clear to what extent this is actually a pulmonary  problem but pt does appear to have difficult to sort out respiratory symptoms of unknown origin for which  DDX  = almost all start with A and  include Adherence, Ace Inhibitors, Acid Reflux, Active Sinus Disease, Alpha 1 Antitripsin deficiency, Anxiety masquerading as Airways dz,  ABPA,  Allergy(esp in young), Aspiration (esp in elderly), Adverse effects of meds,  Active smoking or Vaping, A bunch of PE's/clot burden (a few small clots can't cause this syndrome unless there is already severe underlying pulm or vascular dz with poor reserve),  Anemia or thyroid disorder, plus two Bs  = Bronchiectasis and Beta blocker use..and one C= CHF     Adherence is always the initial "prime suspect" and is a multilayered concern that requires a "trust but verify" approach in every patient - starting with knowing how to use medications, especially inhalers, correctly, keeping up with refills and understanding the fundamental difference between maintenance and prns vs those medications only taken for a very short course and then stopped and not refilled.  - return with all meds in hand using a trust but verify approach to confirm accurate Medication  Reconciliation The principal here is that until we are certain that the  patients are doing what we've asked, it makes no sense to ask them to do more.    Active smoking   To of the rest of the list of usual suspects (see separate a/p)    ? Asthma/ copd > no variability/ nl spirometry strongly against  ? Alpha one AT def > not needed with nl spirometry   ? Anxiety/depression /deconditioning > usually at the bottom of this  list of usual suspects but should be much higher on this pt's based on H and P and   may interfere with adherence and also interpretation of response or lack thereof to symptom management which can be quite subjective.   Anemia/thyroid disorder > assoc fatigue, wt loss and low tsh > needs total T4, T3 uptake and free T3 to complete the w/u   ? A bunch of PEs>  D dimer nl - while a normal  or high normal value (seen commonly in the elderly or chronically ill)  may miss small peripheral pe, the clot burden with sob is moderately high and the d dimer  has a very high neg pred value if used in this setting.    ? BB effects > unlikely on low dose lopressor s demonstrable airflow obst on today's spirometry   ? chf > excluded with bnp so low today    Will have him return for full pfts in 3 weeks to complete the w/u

## 2018-03-01 ENCOUNTER — Telehealth: Payer: Self-pay | Admitting: Emergency Medicine

## 2018-03-01 ENCOUNTER — Other Ambulatory Visit (INDEPENDENT_AMBULATORY_CARE_PROVIDER_SITE_OTHER): Payer: Medicare HMO

## 2018-03-01 DIAGNOSIS — R0609 Other forms of dyspnea: Secondary | ICD-10-CM

## 2018-03-01 LAB — T3, FREE: T3, Free: 2.6 pg/mL (ref 2.3–4.2)

## 2018-03-01 NOTE — Telephone Encounter (Signed)
Per 02/25/18 result note, additional labs are needed.  Labs had already been ordered. Pt is aware and voiced his understanding. Nothing further is needed.

## 2018-03-01 NOTE — Telephone Encounter (Signed)
Pt is calling from the lobby stating that he was told to come in for "more testing and results".

## 2018-03-02 LAB — T4
Free Thyroxine Index: 2.6 (ref 1.4–3.8)
T4, Total: 7.9 ug/dL (ref 4.9–10.5)

## 2018-03-02 LAB — T3 UPTAKE: T3 Uptake: 33 % (ref 22–35)

## 2018-03-02 NOTE — Progress Notes (Signed)
Spoke with pt and notified of results per Dr. Wert. Pt verbalized understanding and denied any questions. 

## 2018-03-10 DIAGNOSIS — I25118 Atherosclerotic heart disease of native coronary artery with other forms of angina pectoris: Secondary | ICD-10-CM | POA: Diagnosis not present

## 2018-03-10 DIAGNOSIS — Z1211 Encounter for screening for malignant neoplasm of colon: Secondary | ICD-10-CM | POA: Diagnosis not present

## 2018-03-10 DIAGNOSIS — I10 Essential (primary) hypertension: Secondary | ICD-10-CM | POA: Diagnosis not present

## 2018-03-10 DIAGNOSIS — R3912 Poor urinary stream: Secondary | ICD-10-CM | POA: Diagnosis not present

## 2018-03-10 DIAGNOSIS — N401 Enlarged prostate with lower urinary tract symptoms: Secondary | ICD-10-CM | POA: Diagnosis not present

## 2018-03-16 ENCOUNTER — Telehealth: Payer: Self-pay | Admitting: *Deleted

## 2018-03-16 NOTE — Telephone Encounter (Signed)
REFERRAL SENT TO SCHEDULING AND NOTES ON FILE FROM [place] [phone]

## 2018-03-25 ENCOUNTER — Ambulatory Visit: Payer: Medicare HMO | Admitting: Internal Medicine

## 2018-03-25 ENCOUNTER — Encounter: Payer: Self-pay | Admitting: Internal Medicine

## 2018-03-25 VITALS — BP 132/74 | HR 81 | Ht 64.0 in | Wt 138.2 lb

## 2018-03-25 DIAGNOSIS — R05 Cough: Secondary | ICD-10-CM | POA: Diagnosis not present

## 2018-03-25 DIAGNOSIS — F17210 Nicotine dependence, cigarettes, uncomplicated: Secondary | ICD-10-CM | POA: Diagnosis not present

## 2018-03-25 DIAGNOSIS — R053 Chronic cough: Secondary | ICD-10-CM | POA: Insufficient documentation

## 2018-03-25 DIAGNOSIS — R059 Cough, unspecified: Secondary | ICD-10-CM

## 2018-03-25 DIAGNOSIS — R0609 Other forms of dyspnea: Secondary | ICD-10-CM | POA: Diagnosis not present

## 2018-03-25 DIAGNOSIS — R69 Illness, unspecified: Secondary | ICD-10-CM | POA: Diagnosis not present

## 2018-03-25 MED ORDER — PANTOPRAZOLE SODIUM 40 MG PO TBEC: 40.0000 mg | DELAYED_RELEASE_TABLET | Freq: Every day | ORAL | 2 refills | Status: DC

## 2018-03-25 MED ORDER — DUTASTERIDE 0.5 MG PO CAPS: 0.5000 mg | ORAL_CAPSULE | Freq: Every day | ORAL | 2 refills | Status: DC

## 2018-03-25 MED ORDER — BUDESONIDE-FORMOTEROL FUMARATE 160-4.5 MCG/ACT IN AERO: INHALATION_SPRAY | RESPIRATORY_TRACT | 0 refills | Status: DC

## 2018-03-25 MED ORDER — BUDESONIDE-FORMOTEROL FUMARATE 160-4.5 MCG/ACT IN AERO: 2.0000 | INHALATION_SPRAY | Freq: Two times a day (BID) | RESPIRATORY_TRACT | 0 refills | Status: DC

## 2018-03-25 MED ORDER — FAMOTIDINE 40 MG PO TABS: ORAL_TABLET | ORAL | 2 refills | Status: DC

## 2018-03-25 MED ORDER — GABAPENTIN 300 MG PO CAPS: 300.0000 mg | ORAL_CAPSULE | Freq: Two times a day (BID) | ORAL | Status: DC

## 2018-03-25 NOTE — Progress Notes (Signed)
Shane Carrillo, male    DOB: 05/22/1945,     MRN: [id number]    Brief patient profile:  42 yobm active smoker with new indolent onset progessive doe x 2017 much worse since April 2019 assoc with severe cough more day than noct > to ER 02/13/18 rx neb helped / pred / amox and self referred to pulmonary office 02/25/2018  With nl spirometry on initial eval.    History of Present Illness  02/25/2018  Pulmonary/ 1st office eval/Romey Cohea  Chief Complaint  Patient presents with  . Pulmonary Consult    Self referral. Pt c/o DOE x 2 years, worse since April 2019.  He gets winded washing his car or doing chores around the house. He also reports loss of appetite and unintended wt loss.   Dyspnea:  MMRC2 = can't walk a nl pace on a flat grade s sob but does fine slow and flat  Cough: day > noct more dry than wet  Sleep: poor sleep but not due to wheezing / sob  SABA use: not sure it helps  rec Please remember to go to the lab department   for your tests - we will call you with the results when they are available. The key is to stop smoking completely before smoking completely stops you! For smoking cessation classes call  [phone]   Please schedule a follow up office visit in 4 weeks, sooner if needed  with all medications /inhalers/ solutions in hand so we can verify exactly what you are taking. This includes all medications from all doctors and over the counters - full pfts on return    03/25/2018  f/u ov/Mckinlee Dunk re: cough/ no meds  Chief Complaint  Patient presents with  . Shortness of Breath    non productive dry cough  Dyspnea:  About the same walks to HT s stopping and it's about 3/4 mile Cough: 10 min p stirs/ really not much mucus  Sleeping: poorly due to insomnia not resp symptoms SABA use: proair seems to help 02: none  Has sense of globus, some dysphagia    No obvious day to day or daytime variability or assoc excess/ purulent sputum or mucus plugs or hemoptysis or cp or chest  tightness, subjective wheeze or overt sinus or hb symptoms.   Sleeping as above without nocturnal  or early am exacerbation  of respiratory  c/o's or need for noct saba. Also denies any obvious fluctuation of symptoms with weather or environmental changes or other aggravating or alleviating factors except as outlined above   No unusual exposure hx or h/o childhood pna/ asthma or knowledge of premature birth.  Current Allergies, Complete Past Medical History, Past Surgical History, Family History, and Social History were reviewed in [employer] record.  ROS  The following are not active complaints unless bolded Hoarseness, sore throat, dysphagia, dental problems, itching, sneezing,  nasal congestion or discharge of excess mucus or purulent secretions, ear ache,   fever, chills, sweats, unintended wt loss or wt gain, classically pleuritic or exertional cp,  orthopnea pnd or arm/hand swelling  or leg swelling, presyncope, palpitations, abdominal pain, anorexia, nausea, vomiting, diarrhea  or change in bowel habits or change in bladder habits, change in stools or change in urine, dysuria, hematuria,  rash, arthralgias, visual complaints, headache, numbness, weakness or ataxia or problems with walking or coordination,  change in mood or  memory.        Current Meds  - - NOTE:  Unable to verify as accurately reflecting what pt takes     Medication Sig  . clopidogrel (PLAVIX) 75 MG tablet Take 75 mg by mouth at bedtime.  . dutasteride (AVODART) 0.5 MG capsule Take 0.5 mg by mouth Carrillo.  . famotidine (PEPCID) 40 MG tablet Take 40 mg by mouth at bedtime as needed.   . gabapentin (NEURONTIN) 300 MG capsule Take 300 mg by mouth 3 (three) times Carrillo as needed (for pain).   . hydrochlorothiazide (HYDRODIURIL) 25 MG tablet Take 1 tablet (25 mg total) by mouth Carrillo.  . metoprolol tartrate (LOPRESSOR) 25 MG tablet Take 25 mg by mouth 2 (two) times Carrillo.  . nitroGLYCERIN (NITROSTAT)  0.4 MG SL tablet Place 0.4 mg under the tongue every 5 (five) minutes as needed for chest pain.  . ranolazine (RANEXA) 1000 MG SR tablet Take 1,000 mg by mouth 2 (two) times Carrillo.  . rosuvastatin (CRESTOR) 20 MG tablet Take 20 mg by mouth at bedtime.              Objective:     amb bm nad   Vital signs reviewed - Note on arrival 02 sats  98% on RA   03/25/2018         138 02/25/18 136 lb (61.7 kg)  02/13/18 148 lb (67.1 kg)  02/14/16 156 lb 7 oz (71 kg)        HEENT:  Poor dentition / oropharynx. Nl external ear canals without cough reflex -  Mild bilateral non-specific turbinate edema     NECK :  without JVD/Nodes/TM/ nl carotid upstrokes bilaterally   LUNGS: no acc muscle use,  Mild barrel  contour chest wall with bilateral  Distant bs s audible wheeze and  without cough on insp or exp maneuver and mild  Hyperresonant  to  percussion bilaterally     CV:  RRR  no s3 or murmur or increase in P2, and no edema   ABD:  soft and nontender with pos late  insp Hoover's  in the supine position. No bruits or organomegaly appreciated, bowel sounds nl  MS:   Nl gait/  ext warm without deformities, calf tenderness, cyanosis -  ? Mild clubbing  No obvious joint restrictions   SKIN: warm and dry without lesions    NEURO:  alert, approp, nl sensorium with  no motor or cerebellar deficits apparent.         I personally reviewed images and agree with radiology impression as follows:  CXR:   02/13/18 Minimal left basilar opacity likely reflects atelectasis; lungs otherwise clear.   Labs   Reviewed 03/25/2018      Chemistry      Component Value Date/Time   NA 140 02/13/2018 0111   K 3.8 02/13/2018 0111   CL 106 02/13/2018 0111   CO2 26 02/13/2018 0111   BUN 12 02/13/2018 0111   CREATININE 1.11 02/13/2018 0111      Component Value Date/Time   CALCIUM 8.7 (L) 02/13/2018 0111   ALKPHOS 52 02/25/2018 1510   AST 14 02/25/2018 1510   ALT 18 02/25/2018 1510   BILITOT 0.4  02/25/2018 1510        Lab Results  Component Value Date   WBC 8.2 02/25/2018   HGB 16.6 02/25/2018   HCT 50.7 02/25/2018   MCV 87.2 02/25/2018   PLT 335.0 02/25/2018       EOS  0.1                                    02/25/2018   Lab Results  Component Value Date   DDIMER 0.42 02/25/2018      Lab Results  Component Value Date   TSH 0.33 (L) 02/25/2018     Lab Results  Component Value Date   PROBNP 66.0 02/25/2018       Lab Results  Component Value Date   ESRSEDRATE 23 (H) 02/25/2018               Assessment   No problem-specific Assessment & Plan notes found for this encounter.     Christinia Gully, MD 02/25/2018

## 2018-03-25 NOTE — Patient Instructions (Addendum)
Start back on gabapentin 300mg  one twice daily   Pantoprazole (protonix) 40 mg   Take  30-60 min before first meal of the day and Pepcid (famotidine)  20 mg one after supper  until return to office - this is the best way to tell whether stomach acid is contributing to your problem.    GERD (REFLUX)  is an extremely common cause of respiratory symptoms just like yours , many times with no obvious heartburn at all.    It can be treated with medication, but also with lifestyle changes including elevation of the head of your bed (ideally with 6 -8inch blocks under the headboard of your bed),  Smoking cessation, avoidance of late meals, excessive alcohol, and avoid fatty foods, chocolate, peppermint, colas, red wine, and acidic juices such as orange juice.  NO MINT OR MENTHOL PRODUCTS SO NO COUGH DROPS  USE SUGARLESS CANDY INSTEAD (Jolley ranchers or Stover's or Life Savers) or even ice chips will also do - the key is to swallow to prevent all throat clearing. NO OIL BASED VITAMINS - use powdered substitutes.  Avoid fish oil when coughing.    Please see patient coordinator before you leave today  to schedule sinus and chest ct    Please schedule a follow up office visit in 4 weeks, sooner if needed  with all medications /inhalers/ solutions in hand so we can verify exactly what you are taking. This includes all medications from all doctors and over the counters - add full pfts on return

## 2018-03-26 ENCOUNTER — Encounter: Payer: Self-pay | Admitting: Internal Medicine

## 2018-03-26 NOTE — Assessment & Plan Note (Signed)
Counseled re importance of smoking cessation but did not meet time criteria for separate billing    F/u in 4 weeks with full pfts and with all meds in hand using a trust but verify approach to confirm accurate Medication  Reconciliation The principal here is that until we are certain that the  patients are doing what we've asked, it makes no sense to ask them to do more.     I had an extended discussion with the patient reviewing all relevant studies completed to date and  lasting 15 to 20 minutes of a 25 minute visit    Each maintenance medication was reviewed in detail including most importantly the difference between maintenance and prns and under what circumstances the prns are to be triggered using an action plan format that is not reflected in the computer generated alphabetically organized AVS.     Please see AVS for specific instructions unique to this visit that I personally wrote and verbalized to the the pt in detail and then reviewed with pt  by my nurse highlighting any  changes in therapy recommended at today's visit to their plan of care.

## 2018-03-26 NOTE — Assessment & Plan Note (Signed)
Onset April 2019  - gabapentin 300 mg bid rec 03/25/2018 plus max rx for gerd  - CT chest and sinus rec  Pt is convinced "there's something wrong" with new pattern of persistent coughing x 8 months and requesting ct's if insurance will cover (which remains to be seen) - in meantime rec standard rx for uacs / irritable larynx syndrome since already had been taking gabapentin in past but not using it consistently enough to help with cough.

## 2018-03-26 NOTE — Assessment & Plan Note (Signed)
Onset around 2017 - Spirometry 02/25/2018  Nl s curvature s prior rx - 02/25/2018   Walked RA  2 laps @ 247ft each @ mod fast pace  stopped due to  Fatigue > sob  - Labs ok 02/25/2018 x for TSH 0.33 >  Nl TFTs  03/01/18   Needs full pfts on return but no need for bronchodilators at this point

## 2018-04-08 ENCOUNTER — Other Ambulatory Visit: Payer: Medicare HMO

## 2018-04-08 ENCOUNTER — Inpatient Hospital Stay: Admission: RE | Admit: 2018-04-08 | Payer: Medicare HMO | Source: Ambulatory Visit

## 2018-05-06 ENCOUNTER — Telehealth: Payer: Self-pay | Admitting: Gastroenterology

## 2018-05-10 ENCOUNTER — Ambulatory Visit: Payer: Medicare HMO | Admitting: Internal Medicine

## 2018-07-19 ENCOUNTER — Other Ambulatory Visit: Payer: Self-pay

## 2018-07-19 ENCOUNTER — Encounter: Payer: Self-pay | Admitting: Family Medicine

## 2018-07-19 ENCOUNTER — Ambulatory Visit (INDEPENDENT_AMBULATORY_CARE_PROVIDER_SITE_OTHER): Payer: Medicare HMO | Admitting: Family Medicine

## 2018-07-19 VITALS — Wt 136.0 lb

## 2018-07-19 DIAGNOSIS — I2510 Atherosclerotic heart disease of native coronary artery without angina pectoris: Secondary | ICD-10-CM | POA: Diagnosis not present

## 2018-07-19 DIAGNOSIS — R1310 Dysphagia, unspecified: Secondary | ICD-10-CM | POA: Diagnosis not present

## 2018-07-19 DIAGNOSIS — I10 Essential (primary) hypertension: Secondary | ICD-10-CM | POA: Diagnosis not present

## 2018-07-19 DIAGNOSIS — R634 Abnormal weight loss: Secondary | ICD-10-CM

## 2018-07-19 DIAGNOSIS — N138 Other obstructive and reflux uropathy: Secondary | ICD-10-CM

## 2018-07-19 DIAGNOSIS — R002 Palpitations: Secondary | ICD-10-CM | POA: Diagnosis not present

## 2018-07-19 DIAGNOSIS — N401 Enlarged prostate with lower urinary tract symptoms: Secondary | ICD-10-CM

## 2018-07-19 DIAGNOSIS — F17200 Nicotine dependence, unspecified, uncomplicated: Secondary | ICD-10-CM

## 2018-07-19 DIAGNOSIS — R69 Illness, unspecified: Secondary | ICD-10-CM | POA: Diagnosis not present

## 2018-07-19 MED ORDER — TAMSULOSIN HCL 0.4 MG PO CAPS: 0.4000 mg | ORAL_CAPSULE | Freq: Every day | ORAL | 0 refills | Status: DC

## 2018-07-19 NOTE — Progress Notes (Signed)
Virtual Visit via Video Note  I connected with pt on 07/19/18 at  3:30 PM EDT by a video enabled telemedicine application and verified that I am speaking with the correct person using two identifiers.  Location patient: home Location provider:work or home office Persons participating in the virtual visit: patient, provider  I discussed the limitations of evaluation and management by telemedicine and the availability of in person appointments. The patient expressed understanding and agreed to proceed.  Telemedicine visit is a necessity given the COVID-19 restrictions in place at the current time.  HPI: 73 y/o male being seen today to establish care.  Prior PCP is Precious Haws on [address].  C/o of approx 18 mo hx of urinary hesitancy, urinary frequency, incomplete emptying. Pt gave equivocal answer to the question of strength of his stream but sounds pretty normal once he gets started.  Daytime and nighttime frequency.  Denies dysuria or hematuria.  Occ has near complete urinary incontinence.  Denies any hx of acute complete urinary retention, no hx of abnormal prostate exam per his report, no hx of seeing a urologist. His PCP when he lived in [place] rx'd him avodart but one 0.5mg  tab did not help.  He proceeded to try 2 and then 3 tabs at a time and eventually felt mild relief from sx's.  Denies any trial of other urology med. Pt is also concerned about prostate ca b/c his father had this.  Pt concerned about colon ca, says his mom and dad both had colon ca. He was set up to get EGD and TCS late 2019 with [facility] but a funeral caused to have to cancel this and he has not rescheduled.    He asks for referral to a cardiologist.  He has hx of CAD with stent placement 12 yrs ago in [place], [place].  He was on ASA and plavix until last year when he saw a cardiologist in [facility]. He was taken off plavix and he admits to not taking his ASA very often.  He is concerned that  the cardiologist in [place] did not care about his complaint.  He says he told the MD that he occasionally had a brief palpitations that provoked a very nervous feeling for a while.  He says the MD told him it was fine.  I do not have any records available.  He denies presyncope or syncope.  Unintentional wt loss is mentioned in GI notes from late last year but he did not mention this today and we spent a long time talking about his other issues.  Review of Systems  Constitutional: Negative for fatigue and fever.  HENT: Negative for congestion and sore throat.   Eyes: Negative for visual disturbance.  Respiratory: Negative for cough.   Cardiovascular: Negative for chest pain.  Gastrointestinal: Negative for abdominal pain, blood in stool and nausea.  Genitourinary: Negative for dysuria.  Musculoskeletal: Negative for back pain and joint swelling.  Skin: Negative for rash.  Neurological: Negative for weakness and headaches.  Hematological: Negative for adenopathy.    Past Medical History:  Diagnosis Date  . Aortic ectasia, abdominal ([place]) 08/2016   On medicare screening u/s: Abdominal aortic atherosclerosis and mild ectasia. Maximal diameter 2.8 cm-->repeat u/s 5 yrs.  [name] BPH with obstruction/lower urinary tract symptoms   . Chronic cough    upper airway cough syndrome/irritable larynx syndrome->Dr. Melvyn Novas.  [name] COPD (chronic obstructive pulmonary disease) ([place])   . Coronary artery disease    Stent.    [name]  DOE (dyspnea on exertion)    spirometry 02/2018 normal, but needs full PFT's per Dr. Melvyn Novas 03/2018.  No bronchodilators indicated as of 03/2018.  [name] GERD (gastroesophageal reflux disease)    Dysphagia  . Hypercholesterolemia    Intol of atorva and simva  . Hypertension   . PAD (peripheral artery disease) ([facility])    Abd aortic athero  . Tobacco dependence   . Unintentional weight loss 2019   +Dysphagia,dec appetite/abd pain->pt was set to get EGD/colonoscopy late 2019 but he never followed up  with GI to get this.    Past Surgical History:  Procedure Laterality Date  . CORONARY ANGIOPLASTY WITH STENT PLACEMENT     Dr. Ouida Sills in [place], [place].  [name] FOOT SURGERY    . ROTATOR CUFF REPAIR      Family History  Problem Relation Age of Onset  . Cancer - Cervical Mother   . Colon cancer Mother   . Colon cancer Father   . Prostate cancer Father   . Diabetes Maternal Grandmother     Social History   Socioeconomic History  . Marital status: Married    Spouse name: Not on file  . Number of children: Not on file  . Years of education: Not on file  . Highest education level: Not on file  Occupational History  . Not on file  Social Needs  . Financial resource strain: Not on file  . Food insecurity:    Worry: Not on file    Inability: Not on file  . Transportation needs:    Medical: Not on file    Non-medical: Not on file  Tobacco Use  . Smoking status: Current Every Day Smoker    Packs/day: 1.00    Years: 55.00    Pack years: 55.00    Types: Cigarettes  . Smokeless tobacco: Never Used  Substance and Sexual Activity  . Alcohol use: Not Currently  . Drug use: No  . Sexual activity: Not on file  Lifestyle  . Physical activity:    Days per week: Not on file    Minutes per session: Not on file  . Stress: Not on file  Relationships  . Social connections:    Talks on phone: Not on file    Gets together: Not on file    Attends religious service: Not on file    Active member of club or organization: Not on file    Attends meetings of clubs or organizations: Not on file    Relationship status: Not on file  Other Topics Concern  . Not on file  Social History Narrative   Married, 12 grown children.   Orig from [place], [place].   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   Tob: 55 pack-yr hx.  No alc, no drugs.      Current Outpatient Medications:  .  budesonide-formoterol (SYMBICORT) 160-4.5 MCG/ACT inhaler, Take 1 puffs first thing in  am and then another  1 puffs about 12 hours later., Disp: , Rfl: 0 .  dutasteride (AVODART) 0.5 MG capsule, Take 1 capsule (0.5 mg total) by mouth daily., Disp: 30 capsule, Rfl: 2 .  famotidine (PEPCID) 40 MG tablet, One after supper, Disp: 30 tablet, Rfl: 2 .  gabapentin (NEURONTIN) 300 MG capsule, Take 1 capsule (300 mg total) by mouth 2 (two) times daily., Disp: , Rfl:  .  hydrochlorothiazide (HYDRODIURIL) 25 MG tablet, Take 1 tablet (25 mg total) by mouth daily., Disp: 30 tablet, Rfl: 0 .  metoprolol tartrate (LOPRESSOR) 25 MG tablet, Take 25 mg by mouth 2 (two) times daily., Disp: , Rfl:  .  pantoprazole (PROTONIX) 40 MG tablet, Take 1 tablet (40 mg total) by mouth daily. Take 30-60 min before first meal of the day, Disp: 30 tablet, Rfl: 2 .  rosuvastatin (CRESTOR) 20 MG tablet, Take 20 mg by mouth at bedtime., Disp: , Rfl:  .  nitroGLYCERIN (NITROSTAT) 0.4 MG SL tablet, Place 0.4 mg under the tongue every 5 (five) minutes as needed for chest pain., Disp: , Rfl:  .  ranolazine (RANEXA) 1000 MG SR tablet, Take 1,000 mg by mouth 2 (two) times daily., Disp: , Rfl:  .  tamsulosin (FLOMAX) 0.4 MG CAPS capsule, Take 1 capsule (0.4 mg total) by mouth daily., Disp: 30 capsule, Rfl: 0  EXAM:  VITALS per patient if applicable: Wt 136 lb (61.7 kg)   BMI 23.34 kg/m    GENERAL: alert, oriented, appears well and in no acute distress  HEENT: atraumatic, conjunttiva clear, no obvious abnormalities on inspection of external nose and ears  NECK: normal movements of the head and neck  LUNGS: on inspection no signs of respiratory distress, breathing rate appears normal, no obvious gross SOB, gasping or wheezing  CV: no obvious cyanosis  MS: moves all visible extremities without noticeable abnormality  PSYCH/NEURO: pleasant and cooperative, no obvious depression or anxiety, speech and thought processing grossly intact  LABS: none today  Lab Results  Component Value Date   TSH 0.33 (L) 02/25/2018    On 02/25/18: T4 total 7.9 (normal) Free T3 2.6 (normal) T3 uptake 33% (normal)  Lab Results  Component Value Date   WBC 8.2 02/25/2018   HGB 16.6 02/25/2018   HCT 50.7 02/25/2018   MCV 87.2 02/25/2018   PLT 335.0 02/25/2018   Lab Results  Component Value Date   CREATININE 1.11 02/13/2018   BUN 12 02/13/2018   NA 140 02/13/2018   K 3.8 02/13/2018   CL 106 02/13/2018   CO2 26 02/13/2018   Lab Results  Component Value Date   ALT 18 02/25/2018   AST 14 02/25/2018   ALKPHOS 52 02/25/2018   BILITOT 0.4 02/25/2018   ASSESSMENT AND PLAN:  Discussed the following assessment and plan:  New pt, establish care, no PCP records available at this time.  Reviewed past GI o/v and 2 pulm O/v notes in EMR today.   1) BPH: OAB less likely.  No signif response to normal dose of avodart. Will try tamsulosin 0.4mg  qhs.  Therapeutic expectations and side effect profile of medication discussed today.  Patient's questions answered. Will do DRE and obtain PSA when I see him in office for f/u in 2 wks.  2) CAD: occasional palpitations but no CP or syncope. Pt cannot recall name of cardiologist he saw in [place]. This info may be in prior PCP's records so we'll take a look when these come in. Will refer to [facility] cardiology.  3) GI concerns: [facility] at [address] in [facility] has evaluated him late 2019 for unintentional wt loss, decreased appetite, and dysphagia.  He was set up to do upper and lower endoscopy but had to cancel.  I gave pt their contact info today and he will need to call them to reschedule this.  4) DOE and chronic cough: he has been evaluated by Dr. Melvyn Novas.   No bronchodilators recommended.   Tx for UACS. Dr. Melvyn Novas recommended he get full PFTs in the future. It is not clear  whether the pt will f/u with Dr. Melvyn Novas. Will get more info on this at next f/u appt.  5) HTN: no bp info available today (virtual visit and pt has no bp cuff). Will continue hctz and metoprolol. Check CMET  when pt comes in for f/u in 2 wks. Will check CBC and FLP at that time as well.  6) Low TSH: normal T4 and T3. This was 6 mo ago. Will repeat thyroid labs at next f/u in 2 wks.  7) Tobacco dependence: pt is encouraged to quit but he is not contemplating cessation attempt at this time.  I discussed the assessment and treatment plan with the patient. The patient was provided an opportunity to ask questions and all were answered. The patient agreed with the plan and demonstrated an understanding of the instructions.   The patient was advised to call back or seek an in-person evaluation if the symptoms worsen or if the condition fails to improve as anticipated.  F/u: 2 wks, needs DRE, PSA, and fasting [place] at that time.  Signed:  Crissie Sickles, MD           07/19/2018

## 2018-10-09 ENCOUNTER — Emergency Department (HOSPITAL_COMMUNITY): Payer: Medicare HMO

## 2018-10-09 ENCOUNTER — Other Ambulatory Visit: Payer: Self-pay

## 2018-10-09 ENCOUNTER — Observation Stay (HOSPITAL_COMMUNITY)
Admission: EM | Admit: 2018-10-09 | Discharge: 2018-10-10 | Disposition: A | Discharge status: Home / Self Care | Payer: Medicare HMO | Attending: Cardiovascular Disease | Admitting: Cardiovascular Disease

## 2018-10-09 ENCOUNTER — Encounter (HOSPITAL_COMMUNITY): Payer: Self-pay | Admitting: *Deleted

## 2018-10-09 DIAGNOSIS — R079 Chest pain, unspecified: Secondary | ICD-10-CM

## 2018-10-09 DIAGNOSIS — I10 Essential (primary) hypertension: Secondary | ICD-10-CM | POA: Insufficient documentation

## 2018-10-09 DIAGNOSIS — I2510 Atherosclerotic heart disease of native coronary artery without angina pectoris: Secondary | ICD-10-CM | POA: Insufficient documentation

## 2018-10-09 DIAGNOSIS — I351 Nonrheumatic aortic (valve) insufficiency: Secondary | ICD-10-CM | POA: Diagnosis not present

## 2018-10-09 DIAGNOSIS — Z7951 Long term (current) use of inhaled steroids: Secondary | ICD-10-CM | POA: Diagnosis not present

## 2018-10-09 DIAGNOSIS — K5900 Constipation, unspecified: Secondary | ICD-10-CM | POA: Insufficient documentation

## 2018-10-09 DIAGNOSIS — J449 Chronic obstructive pulmonary disease, unspecified: Secondary | ICD-10-CM | POA: Insufficient documentation

## 2018-10-09 DIAGNOSIS — E7800 Pure hypercholesterolemia, unspecified: Secondary | ICD-10-CM | POA: Diagnosis not present

## 2018-10-09 DIAGNOSIS — Z886 Allergy status to analgesic agent status: Secondary | ICD-10-CM | POA: Diagnosis not present

## 2018-10-09 DIAGNOSIS — Z955 Presence of coronary angioplasty implant and graft: Secondary | ICD-10-CM | POA: Insufficient documentation

## 2018-10-09 DIAGNOSIS — I25110 Atherosclerotic heart disease of native coronary artery with unstable angina pectoris: Secondary | ICD-10-CM | POA: Diagnosis not present

## 2018-10-09 DIAGNOSIS — K219 Gastro-esophageal reflux disease without esophagitis: Secondary | ICD-10-CM | POA: Diagnosis not present

## 2018-10-09 DIAGNOSIS — Z20828 Contact with and (suspected) exposure to other viral communicable diseases: Secondary | ICD-10-CM | POA: Diagnosis not present

## 2018-10-09 DIAGNOSIS — N400 Enlarged prostate without lower urinary tract symptoms: Secondary | ICD-10-CM | POA: Insufficient documentation

## 2018-10-09 DIAGNOSIS — Z7902 Long term (current) use of antithrombotics/antiplatelets: Secondary | ICD-10-CM | POA: Diagnosis not present

## 2018-10-09 DIAGNOSIS — Z9861 Coronary angioplasty status: Secondary | ICD-10-CM | POA: Diagnosis not present

## 2018-10-09 DIAGNOSIS — Z79899 Other long term (current) drug therapy: Secondary | ICD-10-CM | POA: Diagnosis not present

## 2018-10-09 DIAGNOSIS — F17210 Nicotine dependence, cigarettes, uncomplicated: Secondary | ICD-10-CM | POA: Diagnosis not present

## 2018-10-09 DIAGNOSIS — I739 Peripheral vascular disease, unspecified: Secondary | ICD-10-CM | POA: Insufficient documentation

## 2018-10-09 DIAGNOSIS — Z7982 Long term (current) use of aspirin: Secondary | ICD-10-CM | POA: Insufficient documentation

## 2018-10-09 DIAGNOSIS — E785 Hyperlipidemia, unspecified: Secondary | ICD-10-CM | POA: Diagnosis not present

## 2018-10-09 DIAGNOSIS — I249 Acute ischemic heart disease, unspecified: Secondary | ICD-10-CM

## 2018-10-09 DIAGNOSIS — I422 Other hypertrophic cardiomyopathy: Secondary | ICD-10-CM | POA: Diagnosis not present

## 2018-10-09 HISTORY — DX: Acute ischemic heart disease, unspecified: I24.9

## 2018-10-09 LAB — BASIC METABOLIC PANEL
Anion gap: 7 (ref 5–15)
BUN: 12 mg/dL (ref 8–23)
CO2: 23 mmol/L (ref 22–32)
Calcium: 8.9 mg/dL (ref 8.9–10.3)
Chloride: 105 mmol/L (ref 98–111)
Creatinine, Ser: 1.06 mg/dL (ref 0.61–1.24)
GFR calc Af Amer: >60 mL/min (ref >60)
GFR calc non Af Amer: >60 mL/min (ref >60)
Glucose, Bld: 103 mg/dL — ABNORMAL HIGH (ref 70–99)
Potassium: 4.3 mmol/L (ref 3.5–5.1)
Sodium: 135 mmol/L (ref 135–145)

## 2018-10-09 LAB — HEPARIN LEVEL (UNFRACTIONATED): Heparin Unfractionated: <0.1 IU/mL — ABNORMAL LOW (ref 0.30–0.70)

## 2018-10-09 LAB — TROPONIN I (HIGH SENSITIVITY)
Troponin I (High Sensitivity): 11 ng/L (ref <18)
Troponin I (High Sensitivity): 10 ng/L (ref <18)

## 2018-10-09 LAB — CBC
HCT: 46 % (ref 39.0–52.0)
Hemoglobin: 15.3 g/dL (ref 13.0–17.0)
MCH: 28 pg (ref 26.0–34.0)
MCHC: 33.3 g/dL (ref 30.0–36.0)
MCV: 84.2 fL (ref 80.0–100.0)
Platelets: 258 10*3/uL (ref 150–400)
RBC: 5.46 MIL/uL (ref 4.22–5.81)
RDW: 14.7 % (ref 11.5–15.5)
WBC: 6.9 10*3/uL (ref 4.0–10.5)
nRBC: 0 % (ref 0.0–0.2)

## 2018-10-09 LAB — D-DIMER, QUANTITATIVE: D-Dimer, Quant: 1.04 ug/mL-FEU — ABNORMAL HIGH (ref 0.00–0.50)

## 2018-10-09 LAB — SARS CORONAVIRUS 2 (TAT 6-24 HRS): SARS Coronavirus 2: NEGATIVE

## 2018-10-09 MED ORDER — METOPROLOL TARTRATE 25 MG PO TABS
25.0000 mg | ORAL_TABLET | Freq: Two times a day (BID) | ORAL | Status: DC
  Administered 2018-10-09 – 2018-10-10 (×2): 25 mg via ORAL
  Filled 2018-10-09 (×2): qty 1

## 2018-10-09 MED ORDER — ASPIRIN 81 MG PO CHEW
324.0000 mg | CHEWABLE_TABLET | Freq: Once | ORAL | Status: AC
  Administered 2018-10-09: 324 mg via ORAL
  Filled 2018-10-09: qty 4

## 2018-10-09 MED ORDER — FLUTICASONE FUROATE-VILANTEROL 200-25 MCG/INH IN AEPB
1.0000 | INHALATION_SPRAY | Freq: Every day | RESPIRATORY_TRACT | Status: DC
  Filled 2018-10-09: qty 28

## 2018-10-09 MED ORDER — HEPARIN (PORCINE) 25000 UT/250ML-% IV SOLN
950.0000 [IU]/h | INTRAVENOUS | Status: DC
  Administered 2018-10-09: 750 [IU]/h via INTRAVENOUS
  Administered 2018-10-10: 950 [IU]/h via INTRAVENOUS
  Filled 2018-10-09 (×2): qty 250

## 2018-10-09 MED ORDER — ROSUVASTATIN CALCIUM 20 MG PO TABS
20.0000 mg | ORAL_TABLET | Freq: Every day | ORAL | Status: DC
  Administered 2018-10-09: 20 mg via ORAL
  Filled 2018-10-09: qty 1

## 2018-10-09 MED ORDER — CLOPIDOGREL BISULFATE 75 MG PO TABS
75.0000 mg | ORAL_TABLET | Freq: Every day | ORAL | Status: DC
  Administered 2018-10-10: 75 mg via ORAL
  Filled 2018-10-09: qty 1

## 2018-10-09 MED ORDER — GABAPENTIN 300 MG PO CAPS
300.0000 mg | ORAL_CAPSULE | Freq: Two times a day (BID) | ORAL | Status: DC
  Administered 2018-10-09 – 2018-10-10 (×2): 300 mg via ORAL
  Filled 2018-10-09 (×2): qty 1

## 2018-10-09 MED ORDER — NITROGLYCERIN 0.4 MG SL SUBL: 0.4000 mg | SUBLINGUAL_TABLET | SUBLINGUAL | Status: DC | PRN

## 2018-10-09 MED ORDER — DUTASTERIDE 0.5 MG PO CAPS
0.5000 mg | ORAL_CAPSULE | Freq: Every day | ORAL | Status: DC
  Administered 2018-10-10: 0.5 mg via ORAL
  Filled 2018-10-09: qty 1

## 2018-10-09 MED ORDER — RANOLAZINE ER 500 MG PO TB12
1000.0000 mg | ORAL_TABLET | Freq: Two times a day (BID) | ORAL | Status: DC
  Administered 2018-10-09 – 2018-10-10 (×2): 1000 mg via ORAL
  Filled 2018-10-09 (×3): qty 2

## 2018-10-09 MED ORDER — HEPARIN BOLUS VIA INFUSION
3700.0000 [IU] | Freq: Once | INTRAVENOUS | Status: AC
  Administered 2018-10-09: 3700 [IU] via INTRAVENOUS
  Filled 2018-10-09: qty 3700

## 2018-10-09 MED ORDER — ASPIRIN EC 81 MG PO TBEC: 81.0000 mg | DELAYED_RELEASE_TABLET | Freq: Every day | ORAL | Status: DC

## 2018-10-09 MED ORDER — HEPARIN BOLUS VIA INFUSION
2000.0000 [IU] | Freq: Once | INTRAVENOUS | Status: AC
  Administered 2018-10-09: 2000 [IU] via INTRAVENOUS
  Filled 2018-10-09: qty 2000

## 2018-10-09 MED ORDER — ONDANSETRON HCL 4 MG/2ML IJ SOLN: 4.0000 mg | Freq: Four times a day (QID) | INTRAMUSCULAR | Status: DC | PRN

## 2018-10-09 MED ORDER — MORPHINE SULFATE (PF) 4 MG/ML IV SOLN
4.0000 mg | Freq: Once | INTRAVENOUS | Status: DC
  Filled 2018-10-09 (×2): qty 1

## 2018-10-09 MED ORDER — ATORVASTATIN CALCIUM 80 MG PO TABS: 80.0000 mg | ORAL_TABLET | Freq: Every day | ORAL | Status: DC

## 2018-10-09 MED ORDER — NITROGLYCERIN 0.4 MG SL SUBL
0.4000 mg | SUBLINGUAL_TABLET | Freq: Once | SUBLINGUAL | Status: DC
  Filled 2018-10-09: qty 1

## 2018-10-09 MED ORDER — ASPIRIN EC 81 MG PO TBEC
81.0000 mg | DELAYED_RELEASE_TABLET | Freq: Every day | ORAL | Status: DC
  Administered 2018-10-10: 81 mg via ORAL
  Filled 2018-10-09: qty 1

## 2018-10-09 MED ORDER — ACETAMINOPHEN 325 MG PO TABS: 650.0000 mg | ORAL_TABLET | ORAL | Status: DC | PRN

## 2018-10-09 MED ORDER — SODIUM CHLORIDE 0.9% FLUSH
3.0000 mL | Freq: Once | INTRAVENOUS | Status: AC
  Administered 2018-10-09: 3 mL via INTRAVENOUS

## 2018-10-09 NOTE — ED Provider Notes (Signed)
[facility] EMERGENCY DEPARTMENT Provider Note   CSN: [id number] Arrival date & time: 10/09/18  0414     History   Chief Complaint Chief Complaint  Patient presents with  . Chest Pain    HPI Shane Carrillo is a 73 y.o. male with a hx of tobacco abuse, COPD, GERD, HTN, hypercholesterolemia, PAD, & CAD s/p LIMA to the LAD in 2008 who presents to the ED w/ complaints of chest pain intermittently for the past few days, worse over the past 24 hours. Patient states he is having pain to the R chest that radiates around to the R shoulder blade- does not radiate straight through. He describes it as an aching sensation, currently a 6-7/10. Initially pain was intermittent, it as seemed more constant since yesterday with twinges of increased pain. He states that the pain is aggravated by exertion, however can increase without trigger. No alleviating factors. Yesterday he did have an episode while he was working on his car and the pain increased associated with diaphoresis & lightheadedness, this resolved spontaneously & he did not have a syncopal episode. He states certain qualities of this pain remind him of when he required his bypass surgery which concerned him. He states his last stress test was in 2013 and was normal. He does not have a cardiologist locally. Denies syncope, dyspnea, nausea, vomiting, leg pain/swelling,  hemoptysis, recent surgery/trauma, personal hx of cancer, or hx of DVT/PE. He states he did travel in a car for 8 hours about a month ago but stopped and got out of the car multiple times throughout trip. He took 324 of ASA @ 23:00 last night.     HPI  Past Medical History:  Diagnosis Date  . Aortic ectasia, abdominal ([place]) 08/2016   On medicare screening u/s: Abdominal aortic atherosclerosis and mild ectasia. Maximal diameter 2.8 cm-->repeat u/s 5 yrs.  [name] BPH with obstruction/lower urinary tract symptoms   . Chronic cough    upper airway cough  syndrome/irritable larynx syndrome->Dr. Melvyn Novas.  [name] COPD (chronic obstructive pulmonary disease) ([place])   . Coronary artery disease    Stent.    . DOE (dyspnea on exertion)    spirometry 02/2018 normal, but needs full PFT's per Dr. Melvyn Novas 03/2018.  No bronchodilators indicated as of 03/2018.  [name] GERD (gastroesophageal reflux disease)    Dysphagia  . Hypercholesterolemia    Intol of atorva and simva  . Hypertension   . PAD (peripheral artery disease) (HCC)    Abd aortic athero  . Tobacco dependence   . Unintentional weight loss 2019   +Dysphagia,dec appetite/abd pain->pt was set to get EGD/colonoscopy late 2019 but he never followed up with GI to get this.    Patient Active Problem List   Diagnosis Date Noted  . Chronic cough 03/25/2018  . Cigarette smoker 02/26/2018  . DOE (dyspnea on exertion) 02/25/2018    Past Surgical History:  Procedure Laterality Date  . CORONARY ANGIOPLASTY WITH STENT PLACEMENT     Dr. Ouida Sills in [place], [place].  [name] FOOT SURGERY    . ROTATOR CUFF REPAIR          Home Medications    Prior to Admission medications   Medication Sig Start Date End Date Taking? Authorizing Provider  budesonide-formoterol (SYMBICORT) 160-4.5 MCG/ACT inhaler Take 1 puffs first thing in am and then another  1 puffs about 12 hours later. 03/25/18   Tanda Rockers, MD  dutasteride (AVODART) 0.5 MG capsule Take 1 capsule (0.5 mg  total) by mouth daily. 03/25/18   Tanda Rockers, MD  famotidine (PEPCID) 40 MG tablet One after supper 03/25/18   Tanda Rockers, MD  gabapentin (NEURONTIN) 300 MG capsule Take 1 capsule (300 mg total) by mouth 2 (two) times daily. 03/25/18   Tanda Rockers, MD  hydrochlorothiazide (HYDRODIURIL) 25 MG tablet Take 1 tablet (25 mg total) by mouth daily. 01/13/16   Mabe, Forbes Cellar, MD  metoprolol tartrate (LOPRESSOR) 25 MG tablet Take 25 mg by mouth 2 (two) times daily.    [provider]  nitroGLYCERIN (NITROSTAT) 0.4 MG SL tablet Place 0.4 mg under the  tongue every 5 (five) minutes as needed for chest pain.    [provider]  pantoprazole (PROTONIX) 40 MG tablet Take 1 tablet (40 mg total) by mouth daily. Take 30-60 min before first meal of the day 03/25/18   Tanda Rockers, MD  ranolazine (RANEXA) 1000 MG SR tablet Take 1,000 mg by mouth 2 (two) times daily.    [provider]  rosuvastatin (CRESTOR) 20 MG tablet Take 20 mg by mouth at bedtime.    [provider]  tamsulosin (FLOMAX) 0.4 MG CAPS capsule Take 1 capsule (0.4 mg total) by mouth daily. 07/19/18   McGowen, Adrian Blackwater, MD    Family History Family History  Problem Relation Age of Onset  . Cancer - Cervical Mother   . Colon cancer Mother   . Colon cancer Father   . Prostate cancer Father   . Diabetes Maternal Grandmother     Social History Social History   Tobacco Use  . Smoking status: Current Every Day Smoker    Packs/day: 1.00    Years: 55.00    Pack years: 55.00    Types: Cigarettes  . Smokeless tobacco: Never Used  Substance Use Topics  . Alcohol use: Not Currently  . Drug use: No     Allergies   Aspirin   Review of Systems Review of Systems  Constitutional: Positive for diaphoresis (resolved). Negative for chills and fever.  HENT: Negative for congestion, ear pain and sore throat.   Respiratory: Negative for cough and shortness of breath.   Cardiovascular: Positive for chest pain. Negative for palpitations and leg swelling.  Gastrointestinal: Negative for nausea and vomiting.  Genitourinary: Negative for dysuria.  Neurological: Positive for light-headedness (resolved). Negative for syncope.  All other systems reviewed and are negative.    Physical Exam Updated Vital Signs BP (!) 173/87 (BP Location: Right Arm)   Pulse (!) 58   Temp 97.9 F (36.6 C) (Oral)   Resp 18   SpO2 98%   Physical Exam Vitals signs and nursing note reviewed.  Constitutional:      General: He is not in acute distress.    Appearance: He is  well-developed. He is not toxic-appearing.  HENT:     Head: Normocephalic and atraumatic.  Eyes:     General:        Right eye: No discharge.        Left eye: No discharge.     Conjunctiva/sclera: Conjunctivae normal.  Neck:     Musculoskeletal: Neck supple.  Cardiovascular:     Rate and Rhythm: Normal rate and regular rhythm.     Pulses:          Radial pulses are 2+ on the right side and 2+ on the left side.       Dorsalis pedis pulses are 2+ on the right side and 2+  on the left side.  Pulmonary:     Effort: Pulmonary effort is normal. No respiratory distress.     Breath sounds: Normal breath sounds. No wheezing, rhonchi or rales.  Chest:     Comments: Scar from prior sternotomy present.  Abdominal:     General: There is no distension.     Palpations: Abdomen is soft.     Tenderness: There is no abdominal tenderness.  Musculoskeletal:     Right lower leg: He exhibits no tenderness. No edema.     Left lower leg: He exhibits no tenderness. No edema.  Skin:    General: Skin is warm and dry.     Findings: No rash.  Neurological:     Mental Status: He is alert.     Comments: Clear speech.   Psychiatric:        Behavior: Behavior normal.    ED Treatments / Results  Labs (all labs ordered are listed, but only abnormal results are displayed) Labs Reviewed  BASIC METABOLIC PANEL - Abnormal; Notable for the following components:      Result Value   Glucose, Bld 103 (*)    All other components within normal limits  CBC  TROPONIN I (HIGH SENSITIVITY)  TROPONIN I (HIGH SENSITIVITY)    EKG EKG Interpretation  Date/Time:  Saturday October 09 2018 04:23:49 EDT Ventricular Rate:  67 PR Interval:  150 QRS Duration: 78 QT Interval:  386 QTC Calculation: 407 R Axis:   29 Text Interpretation:  Normal sinus rhythm Possible Left atrial enlargement Left ventricular hypertrophy Cannot rule out Septal infarct , age undetermined T wave abnormality, consider inferior ischemia  Abnormal ECG Confirmed by Veryl Speak [phone]) on 10/09/2018 4:27:10 AM   Radiology Dg Chest 2 View  Result Date: 10/09/2018 CLINICAL DATA:  Initial evaluation for acute central chest pain. EXAM: CHEST - 2 VIEW COMPARISON:  Prior radiograph from 02/13/2018 FINDINGS: Transverse heart size stable, and within normal limits. Mediastinal silhouette normal. Aortic atherosclerosis with tortuosity the intrathoracic aorta noted. Lungs normally inflated. No focal infiltrate, pulmonary edema or pleural effusion. No pneumothorax. Nodular densities overlying the mid-lower lungs felt to be most consistent with nipple shadows. Surgical clips overlie the lower neck. No acute osseous abnormality. Multilevel degenerative spondylolysis noted within the visualized spine. IMPRESSION: No radiographic evidence for active cardiopulmonary disease. Electronically Signed   By: Jeannine Boga M.D.   On: 10/09/2018 05:52    Procedures Procedures (including critical care time)  Medications Ordered in ED Medications  sodium chloride flush (NS) 0.9 % injection 3 mL (has no administration in time range)  nitroGLYCERIN (NITROSTAT) SL tablet 0.4 mg (has no administration in time range)  morphine 4 MG/ML injection 4 mg (has no administration in time range)  aspirin chewable tablet 324 mg (324 mg Oral Given 10/09/18 0910)     Initial Impression / Assessment and Plan / ED Course  I have reviewed the triage vital signs and the nursing notes.  Pertinent labs & imaging results that were available during my care of the patient were reviewed by me and considered in my medical decision making (see chart for details).    Patient presents to the emergency department with chest pain. Patient nontoxic appearing, in no apparent distress, BP elevated, vitals otherwise without significant abnormality. Fairly benign physical exam. DDX: ACS, pulmonary embolism, dissection, pneumothorax, effusion, infiltrate, arrhythmia, anemia,  electrolyte derangement, MSK, GERD, anxiety.   Work-up in the ER reviewed by triage:  CBC: No anemia/leukocytosis.   BMP: No  significant electrolyte deragnement.  Troponin: 11, 10 EKG: Ischemic changes but similar to prior.  CXR:  Negative, without infiltrate, effusion, pneumothorax, or fracture/dislocation.   Heart Pathway Score 6 Active current pain- NTG, ASA, & morphine ordered.  Will consult cardiology & hospitalist for admission.   10:03: CONSULT: Discussed w/ hospitalist Dr. Tamala Julian- requesting d-dimer be ordered & call back after cardiology consult.   10:41: CONSULT: Discussed w/ cardiologist Dr. Doylene Canard who will come to the ED to see the patient & admit.   10:46: CONSULT: Re-discussed w/ Dr. Tamala Julian- aware of cardiology admitting the patient.   Findings and plan of care discussed with supervising physician Dr. Jeanell Sparrow who is in agreement.   Final Clinical Impressions(s) / ED Diagnoses   Final diagnoses:  Chest pain, unspecified type    ED Discharge Orders    None       Amaryllis Dyke, PA-C 10/09/18 1115    Pattricia Boss, MD 10/20/18 1147

## 2018-10-09 NOTE — Progress Notes (Signed)
ANTICOAGULATION CONSULT NOTE - Initial Consult  Pharmacy Consult for heparin Indication: chest pain/ACS  Allergies  Allergen Reactions  . Aspirin Other (See Comments)    Reaction:  GI upset  Pt states that he is able to take the coated form.      Patient Measurements: Height: 5\' 4"  (162.6 cm) Weight: 136 lb (61.7 kg)(per last office visit) IBW/kg (Calculated) : 59.2 Heparin Dosing Weight: 61.7kg  Vital Signs: Temp: 97.9 F (36.6 C) (08/22 0642) Temp Source: Oral (08/22 0642) BP: 190/87 (08/22 1030) Pulse Rate: 53 (08/22 1030)  Labs: Recent Labs    10/09/18 0429 10/09/18 0618  HGB 15.3  --   HCT 46.0  --   PLT 258  --   CREATININE 1.06  --   TROPONINIHS 11 10    Estimated Creatinine Clearance: 53.5 mL/min (by C-G formula based on SCr of 1.06 mg/dL).   Medical History: Past Medical History:  Diagnosis Date  . Aortic ectasia, abdominal ([place]) 08/2016   On medicare screening u/s: Abdominal aortic atherosclerosis and mild ectasia. Maximal diameter 2.8 cm-->repeat u/s 5 yrs.  [name] BPH with obstruction/lower urinary tract symptoms   . Chronic cough    upper airway cough syndrome/irritable larynx syndrome->Dr. Melvyn Novas.  [name] COPD (chronic obstructive pulmonary disease) ([place])   . Coronary artery disease    Stent.    . DOE (dyspnea on exertion)    spirometry 02/2018 normal, but needs full PFT's per Dr. Melvyn Novas 03/2018.  No bronchodilators indicated as of 03/2018.  [name] GERD (gastroesophageal reflux disease)    Dysphagia  . Hypercholesterolemia    Intol of atorva and simva  . Hypertension   . PAD (peripheral artery disease) (HCC)    Abd aortic athero  . Tobacco dependence   . Unintentional weight loss 2019   +Dysphagia,dec appetite/abd pain->pt was set to get EGD/colonoscopy late 2019 but he never followed up with GI to get this.    Medications:  Infusions:  . heparin      Assessment: 69 yom presented to the ED with CP. To start IV heparin. Baseline CBC is WNL and he is not  on anticoagulation PTA.   Goal of Therapy:  Heparin level 0.3-0.7 units/ml Monitor platelets by anticoagulation protocol: Yes   Plan:  Heparin bolus 37000 units IV x 1 Heparin gtt 750 units/hr Check an 8 hr heparin level Daily heparin level and CBC  Kilie Rund, Rande Lawman 10/09/2018,10:50 AM

## 2018-10-09 NOTE — ED Notes (Signed)
IV team at bedside 

## 2018-10-09 NOTE — ED Notes (Signed)
Pt refused to take meds until he had food on his stomach. Lunch tray has been ordered.

## 2018-10-09 NOTE — Progress Notes (Signed)
[facility] for heparin Indication: chest pain/ACS  Allergies  Allergen Reactions  . Aspirin Other (See Comments)    Reaction:  GI upset  Pt states that he is able to take the coated form.      Patient Measurements: Height: 5\' 2"  (157.5 cm) Weight: 141 lb 15.6 oz (64.4 kg) IBW/kg (Calculated) : 54.6 Heparin Dosing Weight: 61.7kg  Vital Signs: Temp: 97.9 F (36.6 C) (08/22 2014) Temp Source: Oral (08/22 2014) BP: 133/79 (08/22 2014) Pulse Rate: 64 (08/22 1804)  Labs: Recent Labs    10/09/18 0429 10/09/18 0618 10/09/18 2114  HGB 15.3  --   --   HCT 46.0  --   --   PLT 258  --   --   HEPARINUNFRC  --   --  <0.10*  CREATININE 1.06  --   --   TROPONINIHS 11 10  --     Estimated Creatinine Clearance: 49.4 mL/min (by C-G formula based on SCr of 1.06 mg/dL).   Medical History: Past Medical History:  Diagnosis Date  . Aortic ectasia, abdominal ([place]) 08/2016   On medicare screening u/s: Abdominal aortic atherosclerosis and mild ectasia. Maximal diameter 2.8 cm-->repeat u/s 5 yrs.  [name] BPH with obstruction/lower urinary tract symptoms   . Chronic cough    upper airway cough syndrome/irritable larynx syndrome->Dr. Melvyn Novas.  [name] COPD (chronic obstructive pulmonary disease) ([place])   . Coronary artery disease    Stent.    . DOE (dyspnea on exertion)    spirometry 02/2018 normal, but needs full PFT's per Dr. Melvyn Novas 03/2018.  No bronchodilators indicated as of 03/2018.  [name] GERD (gastroesophageal reflux disease)    Dysphagia  . Hypercholesterolemia    Intol of atorva and simva  . Hypertension   . PAD (peripheral artery disease) (HCC)    Abd aortic athero  . Tobacco dependence   . Unintentional weight loss 2019   +Dysphagia,dec appetite/abd pain->pt was set to get EGD/colonoscopy late 2019 but he never followed up with GI to get this.    Medications:  Infusions:  . heparin 750 Units/hr (10/09/18 1409)    Assessment: 49 yom presented to the ED  with CP on heparin per pharmacy. Plans noted for nuclear stress test -initial heparin level is undetectable  Goal of Therapy:  Heparin level 0.3-0.7 units/ml Monitor platelets by anticoagulation protocol: Yes   Plan:  -heparin bolus 2000 units x1 then increase infusion to 950 units/hr -Heparin level in 6 hours and daily wth CBC daily  Hildred Laser, PharmD Clinical Pharmacist **Pharmacist phone directory can now be found on [email or web address] ([email or web address]).  Listed under [place].

## 2018-10-09 NOTE — ED Notes (Signed)
Dinner tray ordered.

## 2018-10-09 NOTE — ED Notes (Signed)
Lunch tray ordered 

## 2018-10-09 NOTE — H&P (Signed)
Referring Physician:   [name] is an 73 y.o. male.                       Chief Complaint: Chest pain  HPI: 73 years old black male with PMH of CAD, COPD, HTN, Hyperlipidemia, PAD, s/p stent in [place], [place] has intermittent substernal and exertional chest pain radiating to right arm and back. His CXR is unremarkable. His troponin I levels are normal x 2.   Past Medical History:  Diagnosis Date  . Aortic ectasia, abdominal ([place]) 08/2016   On medicare screening u/s: Abdominal aortic atherosclerosis and mild ectasia. Maximal diameter 2.8 cm-->repeat u/s 5 yrs.  [name] BPH with obstruction/lower urinary tract symptoms   . Chronic cough    upper airway cough syndrome/irritable larynx syndrome->Dr. Melvyn Novas.  [name] COPD (chronic obstructive pulmonary disease) ([place])   . Coronary artery disease    Stent.    . DOE (dyspnea on exertion)    spirometry 02/2018 normal, but needs full PFT's per Dr. Melvyn Novas 03/2018.  No bronchodilators indicated as of 03/2018.  [name] GERD (gastroesophageal reflux disease)    Dysphagia  . Hypercholesterolemia    Intol of atorva and simva  . Hypertension   . PAD (peripheral artery disease) (HCC)    Abd aortic athero  . Tobacco dependence   . Unintentional weight loss 2019   +Dysphagia,dec appetite/abd pain->pt was set to get EGD/colonoscopy late 2019 but he never followed up with GI to get this.      Past Surgical History:  Procedure Laterality Date  . CORONARY ANGIOPLASTY WITH STENT PLACEMENT     Dr. Ouida Sills in [place], [place].  [name] FOOT SURGERY    . ROTATOR CUFF REPAIR      Family History  Problem Relation Age of Onset  . Cancer - Cervical Mother   . Colon cancer Mother   . Colon cancer Father   . Prostate cancer Father   . Diabetes Maternal Grandmother    Social History:  reports that he has been smoking cigarettes. He has a 55.00 pack-year smoking history. He has never used smokeless tobacco. He reports previous alcohol use. He reports that he does not use  drugs.  Allergies:  Allergies  Allergen Reactions  . Aspirin Other (See Comments)    Reaction:  GI upset  Pt states that he is able to take the coated form.      (Not in a hospital admission)   Results for orders placed or performed during the hospital encounter of 10/09/18 (from the past 48 hour(s))  Basic metabolic panel     Status: Abnormal   Collection Time: 10/09/18  4:29 AM  Result Value Ref Range   Sodium 135 135 - 145 mmol/L   Potassium 4.3 3.5 - 5.1 mmol/L   Chloride 105 98 - 111 mmol/L   CO2 23 22 - 32 mmol/L   Glucose, Bld 103 (H) 70 - 99 mg/dL   BUN 12 8 - 23 mg/dL   Creatinine, Ser 1.06 0.61 - 1.24 mg/dL   Calcium 8.9 8.9 - 10.3 mg/dL   GFR calc non Af Amer >60 >60 mL/min   GFR calc Af Amer >60 >60 mL/min   Anion gap 7 5 - 15    Comment: Performed at [facility], [place] [address], [place], [place] [address]  CBC     Status: None   Collection Time: 10/09/18  4:29 AM  Result Value Ref Range   WBC 6.9  4.0 - 10.5 K/uL   RBC 5.46 4.22 - 5.81 MIL/uL   Hemoglobin 15.3 13.0 - 17.0 g/dL   HCT 46.0 39.0 - 52.0 %   MCV 84.2 80.0 - 100.0 fL   MCH 28.0 26.0 - 34.0 pg   MCHC 33.3 30.0 - 36.0 g/dL   RDW 14.7 11.5 - 15.5 %   Platelets 258 150 - 400 K/uL   nRBC 0.0 0.0 - 0.2 %    Comment: Performed at [facility], [place] [address], [place], [place] [address]  Troponin I (High Sensitivity)     Status: None   Collection Time: 10/09/18  4:29 AM  Result Value Ref Range   Troponin I (High Sensitivity) 11 <18 ng/L    Comment: (NOTE) Elevated high sensitivity troponin I (hsTnI) values and significant  changes across serial measurements may suggest ACS but many other  chronic and acute conditions are known to elevate hsTnI results.  Refer to the "Links" section for chest pain algorithms and additional  guidance. Performed at [facility], [place] [address] [place], [place] [address]   Troponin I (High Sensitivity)     Status: None   Collection Time: 10/09/18   6:18 AM  Result Value Ref Range   Troponin I (High Sensitivity) 10 <18 ng/L    Comment: (NOTE) Elevated high sensitivity troponin I (hsTnI) values and significant  changes across serial measurements may suggest ACS but many other  chronic and acute conditions are known to elevate hsTnI results.  Refer to the "Links" section for chest pain algorithms and additional  guidance. Performed at [facility], [place] [address] [place], [place] [address]    Dg Chest 2 View  Result Date: 10/09/2018 CLINICAL DATA:  Initial evaluation for acute central chest pain. EXAM: CHEST - 2 VIEW COMPARISON:  Prior radiograph from 02/13/2018 FINDINGS: Transverse heart size stable, and within normal limits. Mediastinal silhouette normal. Aortic atherosclerosis with tortuosity the intrathoracic aorta noted. Lungs normally inflated. No focal infiltrate, pulmonary edema or pleural effusion. No pneumothorax. Nodular densities overlying the mid-lower lungs felt to be most consistent with nipple shadows. Surgical clips overlie the lower neck. No acute osseous abnormality. Multilevel degenerative spondylolysis noted within the visualized spine. IMPRESSION: No radiographic evidence for active cardiopulmonary disease. Electronically Signed   By: Jeannine Boga M.D.   On: 10/09/2018 05:52    Review Of Systems Constitutional: No fever, chills, weight loss or gain. Eyes: No vision change, wears glasses. No discharge or pain. Ears: No hearing loss, No tinnitus. Respiratory: No asthma, COPD, pneumonias. Positive shortness of breath. No hemoptysis. Cardiovascular: Positive chest pain, no palpitation, no leg edema. Gastrointestinal: Positive nausea,no,  vomiting, diarrhea, constipation. No GI bleed. No hepatitis. Genitourinary: No dysuria, hematuria, kidney stone. No incontinance. Neurological: No headache, stroke, seizures.  Psychiatry: No psych facility admission for anxiety, depression, suicide. No detox. Skin: No  rash. Musculoskeletal: Positive joint pain, no fibromyalgia. No neck pain, back pain. Lymphadenopathy: No lymphadenopathy. Hematology: No anemia or easy bruising.   Blood pressure (!) 190/87, pulse (!) 53, temperature 97.9 F (36.6 C), temperature source Oral, resp. rate (!) 22, height 5\' 4"  (1.626 m), weight 61.7 kg, SpO2 100 %. Body mass index is 23.34 kg/m. General appearance: alert, cooperative, appears stated age and no distress Head: Normocephalic, atraumatic. Eyes: Brown eyes, pink conjunctiva, corneas clear. PERRL, EOM's intact. Neck: No adenopathy, no carotid bruit, no JVD, supple, symmetrical, trachea midline and thyroid not enlarged. Resp: Clear to auscultation bilaterally. Cardio:  Regular rate and rhythm, S1, S2 normal, II/VI systolic murmur, no click, rub or gallop GI: Soft, non-tender; bowel sounds normal; no organomegaly. Extremities: No edema, cyanosis or clubbing. Skin: Warm and dry.  Neurologic: Alert and oriented X 3, normal strength. Normal coordination and gait.  Assessment/Plan Acute coronary syndrome CAD S/P Stent in LAD Hypertension Hyperlipidemia COPD PAD Tobacco use disorder  Admit. IV heparin, Coated aspirin and Plavix. Nuclear stress test in AM. Cardiac cath if ischemia or recurrent chest pain.  Time spent: Review of old records, Lab, x-rays, EKG, other cardiac tests, examination, discussion with patient over 70 minutes.  Birdie Riddle, MD  10/09/2018, 10:58 AM

## 2018-10-09 NOTE — ED Notes (Signed)
Paged admitting per [facility]

## 2018-10-09 NOTE — ED Triage Notes (Signed)
Pt says about 10:30 in the morning he started having pain in the central chest and pain in the right arm and right shoulder blade, associated with shortness of breath, diaphoresis and weakness. Hx of cardiac stent

## 2018-10-10 ENCOUNTER — Inpatient Hospital Stay (HOSPITAL_COMMUNITY): Payer: Medicare HMO

## 2018-10-10 DIAGNOSIS — I422 Other hypertrophic cardiomyopathy: Secondary | ICD-10-CM | POA: Diagnosis not present

## 2018-10-10 DIAGNOSIS — I249 Acute ischemic heart disease, unspecified: Secondary | ICD-10-CM | POA: Diagnosis not present

## 2018-10-10 DIAGNOSIS — N400 Enlarged prostate without lower urinary tract symptoms: Secondary | ICD-10-CM | POA: Diagnosis not present

## 2018-10-10 DIAGNOSIS — E785 Hyperlipidemia, unspecified: Secondary | ICD-10-CM | POA: Diagnosis not present

## 2018-10-10 DIAGNOSIS — Z20828 Contact with and (suspected) exposure to other viral communicable diseases: Secondary | ICD-10-CM | POA: Diagnosis not present

## 2018-10-10 DIAGNOSIS — I2510 Atherosclerotic heart disease of native coronary artery without angina pectoris: Secondary | ICD-10-CM | POA: Diagnosis not present

## 2018-10-10 DIAGNOSIS — R079 Chest pain, unspecified: Secondary | ICD-10-CM | POA: Diagnosis not present

## 2018-10-10 DIAGNOSIS — Z9861 Coronary angioplasty status: Secondary | ICD-10-CM | POA: Diagnosis not present

## 2018-10-10 DIAGNOSIS — I10 Essential (primary) hypertension: Secondary | ICD-10-CM | POA: Diagnosis not present

## 2018-10-10 DIAGNOSIS — J449 Chronic obstructive pulmonary disease, unspecified: Secondary | ICD-10-CM | POA: Diagnosis not present

## 2018-10-10 DIAGNOSIS — K5900 Constipation, unspecified: Secondary | ICD-10-CM | POA: Diagnosis not present

## 2018-10-10 DIAGNOSIS — I351 Nonrheumatic aortic (valve) insufficiency: Secondary | ICD-10-CM | POA: Diagnosis not present

## 2018-10-10 DIAGNOSIS — I25110 Atherosclerotic heart disease of native coronary artery with unstable angina pectoris: Secondary | ICD-10-CM | POA: Diagnosis not present

## 2018-10-10 DIAGNOSIS — I739 Peripheral vascular disease, unspecified: Secondary | ICD-10-CM | POA: Diagnosis not present

## 2018-10-10 LAB — LIPID PANEL
Cholesterol: 166 mg/dL (ref 0–200)
HDL: 62 mg/dL (ref >40)
LDL Cholesterol: 94 mg/dL (ref 0–99)
Total CHOL/HDL Ratio: 2.7 RATIO
Triglycerides: 52 mg/dL (ref <150)
VLDL: 10 mg/dL (ref 0–40)

## 2018-10-10 LAB — BASIC METABOLIC PANEL
Anion gap: 8 (ref 5–15)
BUN: 17 mg/dL (ref 8–23)
CO2: 23 mmol/L (ref 22–32)
Calcium: 8.5 mg/dL — ABNORMAL LOW (ref 8.9–10.3)
Chloride: 107 mmol/L (ref 98–111)
Creatinine, Ser: 1.14 mg/dL (ref 0.61–1.24)
GFR calc Af Amer: >60 mL/min (ref >60)
GFR calc non Af Amer: >60 mL/min (ref >60)
Glucose, Bld: 121 mg/dL — ABNORMAL HIGH (ref 70–99)
Potassium: 4.1 mmol/L (ref 3.5–5.1)
Sodium: 138 mmol/L (ref 135–145)

## 2018-10-10 LAB — HEMOGLOBIN A1C
Hgb A1c MFr Bld: 5.7 % — ABNORMAL HIGH (ref 4.8–5.6)
Mean Plasma Glucose: 116.89 mg/dL

## 2018-10-10 LAB — CBC
HCT: 44.4 % (ref 39.0–52.0)
Hemoglobin: 14.2 g/dL (ref 13.0–17.0)
MCH: 27.8 pg (ref 26.0–34.0)
MCHC: 32 g/dL (ref 30.0–36.0)
MCV: 86.9 fL (ref 80.0–100.0)
Platelets: 253 10*3/uL (ref 150–400)
RBC: 5.11 MIL/uL (ref 4.22–5.81)
RDW: 15.2 % (ref 11.5–15.5)
WBC: 6.7 10*3/uL (ref 4.0–10.5)
nRBC: 0 % (ref 0.0–0.2)

## 2018-10-10 LAB — ECHOCARDIOGRAM COMPLETE
Height: 62 in
Weight: 2036.8 oz

## 2018-10-10 LAB — PROTIME-INR
INR: 1.1 (ref 0.8–1.2)
Prothrombin Time: 14.1 seconds (ref 11.4–15.2)

## 2018-10-10 LAB — HEPARIN LEVEL (UNFRACTIONATED): Heparin Unfractionated: 0.38 IU/mL (ref 0.30–0.70)

## 2018-10-10 MED ORDER — POTASSIUM CHLORIDE ER 10 MEQ PO TBCR: 10.0000 meq | EXTENDED_RELEASE_TABLET | Freq: Every day | ORAL | 3 refills | Status: DC

## 2018-10-10 MED ORDER — AMLODIPINE BESYLATE 5 MG PO TABS: 5.0000 mg | ORAL_TABLET | Freq: Every day | ORAL | 3 refills | Status: DC

## 2018-10-10 MED ORDER — REGADENOSON 0.4 MG/5ML IV SOLN
INTRAVENOUS | Status: AC
  Administered 2018-10-10: 11:00:00
  Filled 2018-10-10: qty 5

## 2018-10-10 MED ORDER — DOCUSATE SODIUM 100 MG PO CAPS: 100.0000 mg | ORAL_CAPSULE | Freq: Two times a day (BID) | ORAL | 3 refills | Status: DC

## 2018-10-10 MED ORDER — AMLODIPINE BESYLATE 5 MG PO TABS
5.0000 mg | ORAL_TABLET | Freq: Every day | ORAL | Status: DC
  Administered 2018-10-10: 5 mg via ORAL
  Filled 2018-10-10: qty 1

## 2018-10-10 MED ORDER — TECHNETIUM TC 99M TETROFOSMIN IV KIT
30.0000 | PACK | Freq: Once | INTRAVENOUS | Status: AC | PRN
  Administered 2018-10-10: 30 via INTRAVENOUS

## 2018-10-10 MED ORDER — TECHNETIUM TC 99M TETROFOSMIN IV KIT
10.0000 | PACK | Freq: Once | INTRAVENOUS | Status: AC | PRN
  Administered 2018-10-10: 10 via INTRAVENOUS

## 2018-10-10 MED ORDER — REGADENOSON 0.4 MG/5ML IV SOLN
0.4000 mg | Freq: Once | INTRAVENOUS | Status: AC
  Administered 2018-10-10: 0.4 mg via INTRAVENOUS

## 2018-10-10 MED ORDER — POTASSIUM CHLORIDE ER 10 MEQ PO TBCR
10.0000 meq | EXTENDED_RELEASE_TABLET | Freq: Every day | ORAL | Status: DC
  Administered 2018-10-10: 10 meq via ORAL
  Filled 2018-10-10 (×2): qty 1

## 2018-10-10 MED ORDER — ASPIRIN 81 MG PO TBEC: 81.0000 mg | DELAYED_RELEASE_TABLET | Freq: Every day | ORAL | Status: DC

## 2018-10-10 MED ORDER — ROSUVASTATIN CALCIUM 20 MG PO TABS: 20.0000 mg | ORAL_TABLET | Freq: Every day | ORAL | 3 refills | Status: DC

## 2018-10-10 MED ORDER — DOCUSATE SODIUM 100 MG PO CAPS
100.0000 mg | ORAL_CAPSULE | Freq: Two times a day (BID) | ORAL | Status: DC
  Administered 2018-10-10: 100 mg via ORAL
  Filled 2018-10-10: qty 1

## 2018-10-10 NOTE — Progress Notes (Addendum)
Notified Dr. Doylene Canard of patients blood pressure at discharge. He was fine for patient to be discharged. Patients assessment unchanged from this am. D/c'd to private vehicle in stable condition. Called by my preceptor, Hilaria Ota, RN

## 2018-10-10 NOTE — Discharge Summary (Signed)
Physician Discharge Summary  Patient ID: Shane Carrillo MRN: [id number] DOB/AGE: 10/09/1945 73 y.o.  Admit date: 10/09/2018 Discharge date: 10/10/2018  Admission Diagnoses: Acute coronary syndrome CAD S/P Stent in LAD Hypertension Hyperlipidemia COPD PAD Tobacco use disorder  Discharge Diagnoses:  Principle Problem: Acute coronary syndrome (HCC) Active Problems:   CAD   S/P stent in LAD   Hypertension   Hyperlipidemia   COPD   PAD   Tobacco use disorder   Moderate LVH without obstruction   Aortic regurgitation, mild   Constipation   Benign prostatic hypertrophy  Discharged Condition: good  Hospital Course: 73 years old male with PMH of CAD, S/P stent in [place], [place], COPD, HTN, Hyperlipidemia, PAD had chest pain, occasionally increasing with exertion. He underwent NM myocardial perfusion stress test which showed inferior wall scar. His echocardiogram showed moderate LVH with normal LV systolic function.  Amlodipine, potassium and rosuvastatin were added. Aspirin, Plavix, metoprolol, SL NTG and Ranexa were continued Urology referral for prostate problem was suggested and clinic number was provided. Colace was added for constipation. Lifestyle modification with diet and tobacco cessation He will see me in 1 week and primary doctor in 1 month.  Consults: cardiology  Significant Diagnostic Studies: labs: Normal CBC, BMET, Lipid panel and Hgb A1C.  EKG: NSR with lateral ischemia and LVH by voltage.  CXR: Unremarkable.  NM myocardial perfusion stress test: Inferior scar with 49 % EF  Echocardiogram : Moderate LVH, normal LV systolic function with EF 65 %. Mild AI.  Treatments: cardiac meds: Aspirin, Plavix, metoprolol, amlodipine, HCTZ, Ranexa and Rosuvastatin.  Discharge Exam: Blood pressure (!) 179/105, pulse (!) 54, temperature 97.7 F (36.5 C), resp. rate 16, height 5\' 2"  (1.575 m), weight 57.7 kg, SpO2 100 %. General appearance: alert, cooperative and  appears stated age. Head: Normocephalic, atraumatic. Eyes: Brown eyes, pink conjunctiva, corneas clear. PERRL, EOM's intact.  Neck: No adenopathy, no carotid bruit, no JVD, supple, symmetrical, trachea midline and thyroid not enlarged. Resp: Clear to auscultation bilaterally. Cardio: Regular rate and rhythm, S1, S2 normal, III/VI systolic and II/VI diastolic murmur, no click, rub or gallop. GI: Soft, non-tender; bowel sounds normal; no organomegaly. Extremities: No edema, cyanosis or clubbing. Skin: Warm and dry.  Neurologic: Alert and oriented X 3, normal strength and tone. Normal coordination and gait.  Disposition: Discharge disposition: 01-Home or Self Care        Allergies as of 10/10/2018      Reactions   Aspirin Other (See Comments)   Reaction:  GI upset  Pt states that he is able to take the coated form.        Medication List    TAKE these medications   amLODipine 5 MG tablet Commonly known as: NORVASC Take 1 tablet (5 mg total) by mouth daily.   aspirin 81 MG EC tablet Take 1 tablet (81 mg total) by mouth daily. Start taking on: October 11, 2018   budesonide-formoterol 160-4.5 MCG/ACT inhaler Commonly known as: SYMBICORT Take 1 puffs first thing in am and then another  1 puffs about 12 hours later. What changed:   how much to take  how to take this  when to take this  reasons to take this  additional instructions   clopidogrel 75 MG tablet Commonly known as: PLAVIX Take 75 mg by mouth daily.   docusate sodium 100 MG capsule Commonly known as: COLACE Take 1 capsule (100 mg total) by mouth 2 (two) times daily.   dutasteride 0.5 MG capsule Commonly  known as: AVODART Take 1 capsule (0.5 mg total) by mouth daily.   gabapentin 300 MG capsule Commonly known as: NEURONTIN Take 1 capsule (300 mg total) by mouth 2 (two) times daily.   hydrochlorothiazide 25 MG tablet Commonly known as: HYDRODIURIL Take 1 tablet (25 mg total) by mouth daily. What  changed:   when to take this  reasons to take this   metoprolol tartrate 25 MG tablet Commonly known as: LOPRESSOR Take 25 mg by mouth 2 (two) times daily.   nitroGLYCERIN 0.4 MG SL tablet Commonly known as: NITROSTAT Place 0.4 mg under the tongue every 5 (five) minutes as needed for chest pain.   potassium chloride 10 MEQ tablet Commonly known as: K-DUR Take 1 tablet (10 mEq total) by mouth daily.   Ranexa 1000 MG SR tablet Generic drug: ranolazine Take 1,000 mg by mouth 2 (two) times daily.   rosuvastatin 20 MG tablet Commonly known as: CRESTOR Take 1 tablet (20 mg total) by mouth daily at 6 PM.      Follow-up Information    Dixie Dials, MD. Schedule an appointment as soon as possible for a visit in 1 week(s).   Specialty: Cardiology Contact information: [address] [place] [address] [phone]        [facility] Follow up.   Why: For prostate check up Contact information: [place] [phone]          Time spent: Review of old chart, current chart, lab, x-ray, cardiac tests and discussion with patient over 60 minutes.  Signed: Birdie Riddle 10/10/2018, 1:45 PM

## 2018-10-10 NOTE — Discharge Instructions (Signed)
Angina  Angina is extreme discomfort in the chest, neck, arm, jaw, or back. The discomfort is caused by a lack of blood in the middle layer of the heart wall (myocardium). There are four types of angina:  Stable angina. This is triggered by vigorous activity or exercise. It goes away when you rest or take angina medicine.  Unstable angina. This is a warning sign and can lead to a heart attack (acute coronary syndrome). This is a medical emergency. Symptoms come at rest and last a long time.  Microvascular angina. This affects the small coronary arteries. Symptoms include feeling tired and being short of breath.  Prinzmetal or variant angina. This is caused by a tightening (spasm) of the arteries that go to your heart. What are the causes? This condition is caused by atherosclerosis. This is the buildup of fat and cholesterol (plaque) in your arteries. The plaque may narrow or block the artery. Other causes of angina include:  Sudden tightening of the muscles of the arteries in the heart (coronary spasm).  Small artery disease (microvascular dysfunction).  Problems with any of your heart valves (heart valve disease).  A tear in an artery in your heart (coronary artery dissection).  Diseases of the heart muscle (cardiomyopathy), or other heart diseases. What increases the risk? You are more likely to develop this condition if you have:  High cholesterol.  High blood pressure (hypertension).  Diabetes.  A family history of heart disease.  An inactive (sedentary) lifestyle, or you do not exercise enough.  Depression.  Had radiation treatment to the left side of your chest. Other risk factors include:  Using tobacco.  Being obese.  Eating a diet high in saturated fats.  Being exposed to high stress or triggers of stress.  Using drugs, such as cocaine. Women have a greater risk for angina if:  They are older than 3.  They have gone through menopause (are  postmenopausal). What are the signs or symptoms? Common symptoms of this condition in both men and women may include:  Chest pain, which may: ? Feel like a crushing or squeezing in the chest, or like a tightness, pressure, fullness, or heaviness in the chest. ? Last for more than a few minutes at a time, or it may stop and come back (recur) over the course of a few minutes.  Pain in the neck, arm, jaw, or back.  Unexplained heartburn or indigestion.  Shortness of breath.  Nausea.  Sudden cold sweats. Women and people with diabetes may have unusual (atypical) symptoms, such as:  Fatigue.  Unexplained feelings of nervousness or anxiety.  Unexplained weakness.  Dizziness or fainting. How is this diagnosed? This condition may be diagnosed based on:  Your symptoms and medical history.  Electrocardiogram (ECG) to measure the electrical activity in your heart.  Blood tests.  Stress test to look for signs of blockage when your heart is stressed.  CT angiogram to examine your heart and the blood flow to it.  Coronary angiogram to check your coronary arteries for blockage. How is this treated? Angina may be treated with:  Medicines to: ? Prevent blood clots and heart attack. ? Relax blood vessels and improve blood flow to the heart (nitrates). ? Reduce blood pressure, improve the pumping action of the heart, and relax blood vessels that are spasming. ? Reduce cholesterol and help treat atherosclerosis.  A procedure to widen a narrowed or blocked coronary artery (angioplasty). A mesh tube may be placed in a coronary artery to  keep it open (coronary stenting).  Surgery to allow blood to go around a blocked artery (coronary artery bypass surgery). Follow these instructions at home: Medicines  Take over-the-counter and prescription medicines only as told by your health care provider.  Do not take the following medicines unless your health care provider approves: ? NSAIDs,  such as ibuprofen or naproxen. ? Vitamin supplements that contain vitamin A, vitamin E, or both. ? Hormone replacement therapy that contains estrogen with or without progestin. Eating and drinking   Eat a heart-healthy diet. This includes plenty of fresh fruits and vegetables, whole grains, low-fat (lean) protein, and low-fat dairy products.  Follow instructions from your health care provider about eating or drinking restrictions. Activity  Follow an exercise program approved by your health care provider.  Consider joining a cardiac rehabilitation program.  Take a break when you feel fatigued. Plan rest periods in your daily activities. Lifestyle   Do not use any products that contain nicotine or tobacco, such as cigarettes, e-cigarettes, and chewing tobacco. If you need help quitting, ask your health care provider.  If your health care provider says you can drink alcohol: ? Limit how much you use to:  0-1 drink a day for nonpregnant women.  0-2 drinks a day for men. ? Be aware of how much alcohol is in your drink. In the [place] one drink equals one 12 oz bottle of beer (355 mL), one 5 oz glass of wine (148 mL), or one 1 oz glass of hard liquor (44 mL). General instructions  Maintain a healthy weight.  Learn to manage stress.  Keep your vaccinations up to date. Get the flu (influenza) vaccine every year.  Talk to your health care provider if you feel depressed. Take a depression screening test to see if you are at risk for depression.  Work with your health care provider to manage other health conditions, such as hypertension or diabetes.  Keep all follow-up visits as told by your health care provider. This is important. Get help right away if:  You have pain in your chest, neck, arm, jaw, or back, and the pain: ? Lasts more than a few minutes. ? Is recurring. ? Is not relieved by taking medicines under the tongue (sublingual nitroglycerin). ? Increases in intensity or  frequency.  You have a lot of sweating without cause.  You have unexplained: ? Heartburn or indigestion. ? Shortness of breath or difficulty breathing. ? Nausea or vomiting. ? Fatigue. ? Feelings of nervousness or anxiety. ? Weakness.  You have sudden light-headedness or dizziness.  You faint. These symptoms may represent a serious problem that is an emergency. Do not wait to see if the symptoms will go away. Get medical help right away. Call your local emergency services (911 in the [place] Do not drive yourself to the hospital. Summary  Angina is extreme discomfort in the chest, neck, arm, jaw, or back that is caused by a lack of blood in the heart wall.  There are many symptoms of angina. They include chest pain, unexplained heartburn or indigestion, sudden cold sweats, and fatigue.  Angina may be treated with behavioral changes, medicine, or surgery.  Symptoms of angina may represent an emergency. Get medical help right away. Call your local emergency services (911 in the [place] Do not drive yourself to the hospital. This information is not intended to replace advice given to you by your health care provider. Make sure you discuss any questions you have with your health care provider.  Document Released: 02/03/2005 Document Revised: 09/21/2017 Document Reviewed: 09/21/2017 Elsevier Patient Education  2020 [employer].

## 2018-10-10 NOTE — Progress Notes (Signed)
  Echocardiogram 2D Echocardiogram has been performed.  Shane Carrillo 10/10/2018, 12:51 PM

## 2018-10-10 NOTE — Care Management CC44 (Signed)
Condition Code 44 Documentation Completed  Patient Details  Name: VARAD TITMAN MRN: [id number] Date of Birth: 1945/10/18   Condition Code 44 given:  Yes Patient signature on Condition Code 44 notice:  Yes Documentation of 2 MD's agreement:  Yes Code 44 added to claim:  Yes    Claudie Leach, RN 10/10/2018, 2:32 PM

## 2018-10-10 NOTE — Progress Notes (Signed)
ANTICOAGULATION CONSULT NOTE - Follow Up Consult  Pharmacy Consult for heparin Indication: chest pain/ACS  Labs: Recent Labs    10/09/18 0429 10/09/18 0618 10/09/18 2114 10/10/18 0450  HGB 15.3  --   --  14.2  HCT 46.0  --   --  44.4  PLT 258  --   --  253  LABPROT  --   --   --  14.1  INR  --   --   --  1.1  HEPARINUNFRC  --   --  <0.10* 0.38  CREATININE 1.06  --   --  1.14  TROPONINIHS 11 10  --   --     Assessment/Plan:  73yo male therapeutic on heparin after rate change. Will continue gtt at current rate and confirm stable with additional level.   Wynona Neat, PharmD, BCPS  10/10/2018,6:47 AM

## 2018-10-14 ENCOUNTER — Telehealth: Payer: Self-pay | Admitting: Family Medicine

## 2018-10-14 NOTE — Telephone Encounter (Signed)
Called patient to give cardiology referral info. While on the phone patient requested to schedule hospital follow up, patient is still not feeling well. Scheduled patient 10/22/18. Patient asked directions to our office. He said we are far from his house. I gave him [employer] phone number. Patient may call them and transfer care.

## 2018-10-14 NOTE — Telephone Encounter (Signed)
Nothing to note on here unless patient decides to transfer.  Closing encounter.

## 2018-10-14 NOTE — Telephone Encounter (Signed)
Did patient decide if he will still be seen here or is he transferring care?

## 2018-10-21 ENCOUNTER — Telehealth: Payer: Self-pay | Admitting: Family Medicine

## 2018-10-21 NOTE — Telephone Encounter (Signed)
Called patient to remind him of his hospital follow up appointment. Patient said [facility] is too far for him to drive. Patient would like to switch his care to the [place] location. Please call patient to schedule new patient appointment. Thank you

## 2018-10-22 ENCOUNTER — Inpatient Hospital Stay: Payer: Medicare HMO | Admitting: Family Medicine

## 2018-10-28 ENCOUNTER — Telehealth: Payer: Self-pay

## 2018-10-28 NOTE — Telephone Encounter (Signed)
Questions for Screening COVID-19  Symptom onset: None  Travel or Contacts: None  During this illness, did/does the patient experience any of the following symptoms? Fever >100.75F []   Yes [x]   No []   Unknown Subjective fever (felt feverish) []   Yes [x]   No []   Unknown Chills []   Yes [x]   No []   Unknown Muscle aches (myalgia) []   Yes [x]   No []   Unknown Runny nose (rhinorrhea) []   Yes [x]   No []   Unknown Sore throat []   Yes []   No []   Unknown Cough (new onset or worsening of chronic cough) []   Yes [x]   No []   Unknown Shortness of breath (dyspnea) []   Yes [x]   No []   Unknown Nausea or vomiting []   Yes [x]   No []   Unknown Headache []   Yes [x]   No []   Unknown Abdominal pain  []   Yes [x]   No []   Unknown Diarrhea (?3 loose/looser than normal stools/24hr period) []   Yes [x]   No []   Unknown Other, specify:  Patient risk factors: Smoker? []   Current []   Former []   Never If male, currently pregnant? []   Yes []   No  Patient Active Problem List   Diagnosis Date Noted  . Acute coronary syndrome ([place]) 10/09/2018  . Chronic cough 03/25/2018  . Cigarette smoker 02/26/2018  . DOE (dyspnea on exertion) 02/25/2018    Plan:  []   High risk for COVID-19 with red flags go to ED (with CP, SOB, weak/lightheaded, or fever > 101.5). Call ahead.  []   High risk for COVID-19 but stable. Inform provider and coordinate time for [facility] visit.   []   No red flags but URI signs or symptoms okay for [facility] visit.

## 2018-10-29 ENCOUNTER — Ambulatory Visit (INDEPENDENT_AMBULATORY_CARE_PROVIDER_SITE_OTHER): Payer: Medicare HMO | Admitting: Family Medicine

## 2018-10-29 ENCOUNTER — Encounter: Payer: Self-pay | Admitting: Family Medicine

## 2018-10-29 ENCOUNTER — Other Ambulatory Visit: Payer: Self-pay

## 2018-10-29 VITALS — BP 136/86 | HR 84 | Temp 98.0°F | Ht 62.0 in | Wt 133.6 lb

## 2018-10-29 DIAGNOSIS — R209 Unspecified disturbances of skin sensation: Secondary | ICD-10-CM

## 2018-10-29 DIAGNOSIS — I25119 Atherosclerotic heart disease of native coronary artery with unspecified angina pectoris: Secondary | ICD-10-CM | POA: Diagnosis not present

## 2018-10-29 DIAGNOSIS — R350 Frequency of micturition: Secondary | ICD-10-CM

## 2018-10-29 DIAGNOSIS — F17210 Nicotine dependence, cigarettes, uncomplicated: Secondary | ICD-10-CM

## 2018-10-29 DIAGNOSIS — N401 Enlarged prostate with lower urinary tract symptoms: Secondary | ICD-10-CM | POA: Diagnosis not present

## 2018-10-29 DIAGNOSIS — I10 Essential (primary) hypertension: Secondary | ICD-10-CM

## 2018-10-29 DIAGNOSIS — R7989 Other specified abnormal findings of blood chemistry: Secondary | ICD-10-CM | POA: Diagnosis not present

## 2018-10-29 DIAGNOSIS — R0609 Other forms of dyspnea: Secondary | ICD-10-CM

## 2018-10-29 DIAGNOSIS — R69 Illness, unspecified: Secondary | ICD-10-CM | POA: Diagnosis not present

## 2018-10-29 DIAGNOSIS — R634 Abnormal weight loss: Secondary | ICD-10-CM | POA: Diagnosis not present

## 2018-10-29 LAB — SEDIMENTATION RATE: Sed Rate: 8 mm/hr (ref 0–20)

## 2018-10-29 LAB — COMPREHENSIVE METABOLIC PANEL
ALT: 10 U/L (ref 0–53)
AST: 12 U/L (ref 0–37)
Albumin: 3.9 g/dL (ref 3.5–5.2)
Alkaline Phosphatase: 47 U/L (ref 39–117)
BUN: 14 mg/dL (ref 6–23)
CO2: 30 mEq/L (ref 19–32)
Calcium: 8.9 mg/dL (ref 8.4–10.5)
Chloride: 105 mEq/L (ref 96–112)
Creatinine, Ser: 1.08 mg/dL (ref 0.40–1.50)
GFR: 81.37 mL/min (ref >60.00)
Glucose, Bld: 94 mg/dL (ref 70–99)
Potassium: 4.6 mEq/L (ref 3.5–5.1)
Sodium: 140 mEq/L (ref 135–145)
Total Bilirubin: 0.3 mg/dL (ref 0.2–1.2)
Total Protein: 5.9 g/dL — ABNORMAL LOW (ref 6.0–8.3)

## 2018-10-29 LAB — CBC
HCT: 43.9 % (ref 39.0–52.0)
Hemoglobin: 14 g/dL (ref 13.0–17.0)
MCHC: 32 g/dL (ref 30.0–36.0)
MCV: 87.3 fl (ref 78.0–100.0)
Platelets: 259 10*3/uL (ref 150.0–400.0)
RBC: 5.02 Mil/uL (ref 4.22–5.81)
RDW: 15.7 % — ABNORMAL HIGH (ref 11.5–15.5)
WBC: 6.5 10*3/uL (ref 4.0–10.5)

## 2018-10-29 LAB — PSA: PSA: 1.55 ng/mL (ref 0.10–4.00)

## 2018-10-29 LAB — T4, FREE: Free T4: 0.92 ng/dL (ref 0.60–1.60)

## 2018-10-29 LAB — TSH: TSH: 1.92 u[IU]/mL (ref 0.35–4.50)

## 2018-10-29 LAB — VITAMIN B12: Vitamin B-12: 354 pg/mL (ref 211–911)

## 2018-10-29 NOTE — Patient Instructions (Signed)
Try taking amlodipine in the evenings.  We'll be in touch with lab results I have entered a referral to cardiology and someone should call you to schedule an appointment.

## 2018-10-29 NOTE — Progress Notes (Addendum)
Shane Carrillo - 73 y.o. male MRN [id number]  Date of birth: 03-Jul-1945  Subjective Chief Complaint  Patient presents with  . New Patient (Initial Visit)    Declines any immunizations. States has no appetite.    HPI Shane Carrillo is a 73 y.o. male with history of HTN, CAD s/p stenting in [place], [place], BPH and mild COPD with continued nicotine use here today for initial appointment with me.  He has concern of decreased appetite and weight loss.  He is concerned about malignancy causing his symptom and wants "all tests for cancer".   He was recently hospitalized for complaint of chest pain.  He was ruled out for ACS and underwent nuclear stress test and TTE.  TTE showed preserved EF of 04-88% and diastolic dysfunction.  Amlodipine, crestor and potassium wee added.   He also had complaint of prosate issues including frequency.  He requests f/u with urology.  He is unsure when his last PSA was.  He does have f/u with cardiology on 11/11/2018.  He reports he is feeling well other than decreased appetite and undesired weight loss.    In regards to his weight loss he has had dysphagia in the past but denies at this time.  He denies any abdominal pain, nausea, or fevers.   ROS:  A comprehensive ROS was completed and negative except as noted per HPI    Allergies  Allergen Reactions  . Aspirin Other (See Comments)    Reaction:  GI upset  Pt states that he is able to take the coated form.      Past Medical History:  Diagnosis Date  . Aortic ectasia, abdominal ([place]) 08/2016   On medicare screening u/s: Abdominal aortic atherosclerosis and mild ectasia. Maximal diameter 2.8 cm-->repeat u/s 5 yrs.  [name] BPH with obstruction/lower urinary tract symptoms   . Chronic cough    upper airway cough syndrome/irritable larynx syndrome->Dr. Melvyn Novas.  [name] COPD (chronic obstructive pulmonary disease) ([place])   . Coronary artery disease    Stent.    . DOE (dyspnea on exertion)    spirometry 02/2018 normal, but  needs full PFT's per Dr. Melvyn Novas 03/2018.  No bronchodilators indicated as of 03/2018.  [name] GERD (gastroesophageal reflux disease)    Dysphagia  . Hypercholesterolemia    Intol of atorva and simva  . Hypertension   . PAD (peripheral artery disease) ([facility])    Abd aortic athero  . Tobacco dependence   . Unintentional weight loss 2019   +Dysphagia,dec appetite/abd pain->pt was set to get EGD/colonoscopy late 2019 but he never followed up with GI to get this.    Past Surgical History:  Procedure Laterality Date  . CORONARY ANGIOPLASTY WITH STENT PLACEMENT     Dr. Ouida Sills in [place], [place].  [name] FOOT SURGERY    . ROTATOR CUFF REPAIR      Social History   Socioeconomic History  . Marital status: Married    Spouse name: Not on file  . Number of children: Not on file  . Years of education: Not on file  . Highest education level: Not on file  Occupational History  . Not on file  Social Needs  . Financial resource strain: Not on file  . Food insecurity    Worry: Not on file    Inability: Not on file  . Transportation needs    Medical: Not on file    Non-medical: Not on file  Tobacco Use  . Smoking status: Current Every Day Smoker  Packs/day: 1.00    Years: 55.00    Pack years: 55.00    Types: Cigarettes  . Smokeless tobacco: Never Used  Substance and Sexual Activity  . Alcohol use: Not Currently  . Drug use: No  . Sexual activity: Not on file  Lifestyle  . Physical activity    Days per week: Not on file    Minutes per session: Not on file  . Stress: Not on file  Relationships  . Social Herbalist on phone: Not on file    Gets together: Not on file    Attends religious service: Not on file    Active member of club or organization: Not on file    Attends meetings of clubs or organizations: Not on file    Relationship status: Not on file  Other Topics Concern  . Not on file  Social History Narrative   Married, 12 grown children.   Orig from [place], [place].    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   Tob: 55 pack-yr hx.  No alc, no drugs.    Family History  Problem Relation Age of Onset  . Cancer - Cervical Mother   . Colon cancer Mother   . Colon cancer Father   . Prostate cancer Father   . Diabetes Maternal Grandmother     Health Maintenance  Topic Date Due  . Hepatitis C Screening  17-May-1945  . COLONOSCOPY  01/05/1997  . TETANUS/TDAP  01/07/2020  . INFLUENZA VACCINE  Discontinued  . PNA vac Low Risk Adult  Discontinued    ----------------------------------------------------------------------------------------------------------------------------------------------------------------------------------------------------------------- Physical Exam BP 136/86 (BP Location: Left Arm, Patient Position: Sitting, Cuff Size: Normal)   Pulse 84   Temp 98 F (36.7 C) (Oral)   Ht 5' 2"  (1.575 m)   Wt 133 lb 9.6 oz (60.6 kg)   SpO2 98%   BMI 24.44 kg/m   Physical Exam Constitutional:      Appearance: Normal appearance.  HENT:     Head: Normocephalic and atraumatic.     Right Ear: Tympanic membrane normal.     Left Ear: Tympanic membrane normal.     Mouth/Throat:     Mouth: Mucous membranes are moist.  Eyes:     General: No scleral icterus. Neck:     Musculoskeletal: Neck supple.  Cardiovascular:     Rate and Rhythm: Normal rate and regular rhythm.  Pulmonary:     Effort: Pulmonary effort is normal.     Breath sounds: Normal breath sounds.  Abdominal:     General: Abdomen is flat. There is no distension.     Palpations: Abdomen is soft.     Tenderness: There is no abdominal tenderness.  Genitourinary:    Comments: Mild symmetric enlargement.   Musculoskeletal:     Right lower leg: No edema.     Left lower leg: No edema.  Skin:    General: Skin is warm and dry.     Findings: No rash (pigmented rash on b/l palms).  Neurological:     General: No focal deficit present.     Mental Status: He is  alert.  Psychiatric:        Mood and Affect: Mood normal.        Behavior: Behavior normal.     ------------------------------------------------------------------------------------------------------------------------------------------------------------------------------------------------------------------- Assessment and Plan  Abnormal TSH -Update thyroid function tests  Unintended weight loss -Up to date on colon cancer screening -Recent CXR negative for abnormality, can consider low dose  CT with history of smoking.  -Update PSA -Check ESR, HIV and RPR (rash on hands, likely eczematous but will check for completeness) -  Cigarette smoker -Counseled on smoking cessation  Essential hypertension BP is well controlled at this time, continue current medications.   CAD (coronary artery disease) -No chest pain currently.  -Has f/u with cardiology in 2 weeks.  -He will continue current medications for now.

## 2018-11-01 LAB — HIV ANTIBODY (ROUTINE TESTING W REFLEX): HIV 1&2 Ab, 4th Generation: NONREACTIVE

## 2018-11-01 LAB — RPR: RPR Ser Ql: NONREACTIVE

## 2018-11-02 ENCOUNTER — Encounter: Payer: Self-pay | Admitting: Family Medicine

## 2018-11-02 DIAGNOSIS — R350 Frequency of micturition: Secondary | ICD-10-CM | POA: Insufficient documentation

## 2018-11-02 DIAGNOSIS — R634 Abnormal weight loss: Secondary | ICD-10-CM | POA: Insufficient documentation

## 2018-11-02 DIAGNOSIS — R7989 Other specified abnormal findings of blood chemistry: Secondary | ICD-10-CM

## 2018-11-02 DIAGNOSIS — N401 Enlarged prostate with lower urinary tract symptoms: Secondary | ICD-10-CM

## 2018-11-02 DIAGNOSIS — I10 Essential (primary) hypertension: Secondary | ICD-10-CM | POA: Insufficient documentation

## 2018-11-02 DIAGNOSIS — I2510 Atherosclerotic heart disease of native coronary artery without angina pectoris: Secondary | ICD-10-CM

## 2018-11-02 HISTORY — DX: Benign prostatic hyperplasia with lower urinary tract symptoms: N40.1

## 2018-11-02 HISTORY — DX: Atherosclerotic heart disease of native coronary artery without angina pectoris: I25.10

## 2018-11-02 HISTORY — DX: Other specified abnormal findings of blood chemistry: R79.89

## 2018-11-02 HISTORY — DX: Essential (primary) hypertension: I10

## 2018-11-02 NOTE — Assessment & Plan Note (Signed)
Counseled on smoking cessation  

## 2018-11-02 NOTE — Assessment & Plan Note (Signed)
-  Up to date on colon cancer screening -Recent CXR negative for abnormality, can consider low dose CT with history of smoking.  -Update PSA -Check ESR, HIV and RPR (rash on hands, likely eczematous but will check for completeness) -

## 2018-11-02 NOTE — Assessment & Plan Note (Addendum)
-  No chest pain currently.  -Has f/u with cardiology in 2 weeks.  -He will continue current medications for now.

## 2018-11-02 NOTE — Assessment & Plan Note (Signed)
BP is well controlled at this time, continue current medications.

## 2018-11-02 NOTE — Assessment & Plan Note (Signed)
-  Update thyroid function tests

## 2018-11-05 NOTE — Progress Notes (Signed)
Please let patient know that labs are normal. Thanks!

## 2018-11-11 ENCOUNTER — Ambulatory Visit: Payer: Medicare HMO | Admitting: Cardiology

## 2018-11-11 NOTE — Progress Notes (Deleted)
Cardiology Office Note:    Date:  11/11/2018   ID:  Shane Carrillo, DOB [birth year], MRN [id number]  PCP:  [name], DO  Cardiologist:  No primary care provider on file.  Electrophysiologist:  None   Referring MD: Tammi Sou, MD   No chief complaint on file. ***  History of Present Illness:    Shane Carrillo is a 73 y.o. male with a hx of CAD s/p PCI in ###, HTN, COPD, BPH who is referred by Dr Zigmund Daniel for an evaluation of CAD.  Recent admission 8/22-8/23/20 with chest ain.  Normal troponins.  Underwent stress MPI which showed fixed inferior defect.  TTE showed moderate LVH with normal systolic function.    Past Medical History:  Diagnosis Date  . Aortic ectasia, abdominal ([place]) 08/2016   On medicare screening u/s: Abdominal aortic atherosclerosis and mild ectasia. Maximal diameter 2.8 cm-->repeat u/s 5 yrs.  [name] BPH with obstruction/lower urinary tract symptoms   . Chronic cough    upper airway cough syndrome/irritable larynx syndrome->Dr. Melvyn Novas.  [name] COPD (chronic obstructive pulmonary disease) ([place])   . Coronary artery disease    Stent.    . DOE (dyspnea on exertion)    spirometry 02/2018 normal, but needs full PFT's per Dr. Melvyn Novas 03/2018.  No bronchodilators indicated as of 03/2018.  [name] GERD (gastroesophageal reflux disease)    Dysphagia  . Hypercholesterolemia    Intol of atorva and simva  . Hypertension   . PAD (peripheral artery disease) ([facility])    Abd aortic athero  . Tobacco dependence   . Unintentional weight loss 2019   +Dysphagia,dec appetite/abd pain->pt was set to get EGD/colonoscopy late 2019 but he never followed up with GI to get this.    Past Surgical History:  Procedure Laterality Date  . CORONARY ANGIOPLASTY WITH STENT PLACEMENT     Dr. Ouida Sills in [place], [place].  [name] FOOT SURGERY    . ROTATOR CUFF REPAIR      Current Medications: No outpatient medications have been marked as taking for the 11/11/18 encounter (Appointment) with Donato Heinz, MD.     Allergies:   Aspirin   Social History   Socioeconomic History  . Marital status: Married    Spouse name: Not on file  . Number of children: Not on file  . Years of education: Not on file  . Highest education level: Not on file  Occupational History  . Not on file  Social Needs  . Financial resource strain: Not on file  . Food insecurity    Worry: Not on file    Inability: Not on file  . Transportation needs    Medical: Not on file    Non-medical: Not on file  Tobacco Use  . Smoking status: Current Every Day Smoker    Packs/day: 1.00    Years: 55.00    Pack years: 55.00    Types: Cigarettes  . Smokeless tobacco: Never Used  Substance and Sexual Activity  . Alcohol use: Not Currently  . Drug use: No  . Sexual activity: Not on file  Lifestyle  . Physical activity    Days per week: Not on file    Minutes per session: Not on file  . Stress: Not on file  Relationships  . Social Herbalist on phone: Not on file    Gets together: Not on file    Attends religious service: Not on file    Active member of club or  organization: Not on file    Attends meetings of clubs or organizations: Not on file    Relationship status: Not on file  Other Topics Concern  . Not on file  Social History Narrative   Married, 12 grown children.   Orig from [place], [place].   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   Tob: 55 pack-yr hx.  No alc, no drugs.     Family History: The patient's ***family history includes Cancer - Cervical in his mother; Colon cancer in his father and mother; Diabetes in his maternal grandmother; Prostate cancer in his father.  ROS:   Please see the history of present illness.    *** All other systems reviewed and are negative.  EKGs/Labs/Other Studies Reviewed:    The following studies were reviewed today: ***  EKG:  EKG is *** ordered today.  The ekg ordered today demonstrates ***  Recent Labs:  02/25/2018: Pro B Natriuretic peptide (BNP) 66.0 10/29/2018: ALT 10; BUN 14; Creatinine, Ser 1.08; Hemoglobin 14.0; Platelets 259.0; Potassium 4.6; Sodium 140; TSH 1.92  Recent Lipid Panel    Component Value Date/Time   CHOL 166 10/10/2018 0450   TRIG 52 10/10/2018 0450   HDL 62 10/10/2018 0450   CHOLHDL 2.7 10/10/2018 0450   VLDL 10 10/10/2018 0450   LDLCALC 94 10/10/2018 0450    Physical Exam:    VS:  There were no vitals taken for this visit.    Wt Readings from Last 3 Encounters:  10/29/18 133 lb 9.6 oz (60.6 kg)  10/10/18 127 lb 4.8 oz (57.7 kg)  07/19/18 136 lb (61.7 kg)     GEN: *** Well nourished, well developed in no acute distress HEENT: Normal NECK: No JVD; No carotid bruits LYMPHATICS: No lymphadenopathy CARDIAC: ***RRR, no murmurs, rubs, gallops RESPIRATORY:  Clear to auscultation without rales, wheezing or rhonchi  ABDOMEN: Soft, non-tender, non-distended MUSCULOSKELETAL:  No edema; No deformity  SKIN: Warm and dry NEUROLOGIC:  Alert and oriented x 3 PSYCHIATRIC:  Normal affect   ASSESSMENT:    No diagnosis found. PLAN:    In order of problems listed above:  CAD: s/p PCI in [place], [place] in ### - Continue ASA, plavix, crestor  - Continue ranexa  HTN: on lisinopril 20 mg and amlodipine 5 mg daily  HLD: on crestor 20 mg daily.  Last LDL 94 10/10/18   Medication Adjustments/Labs and Tests Ordered: Current medicines are reviewed at length with the patient today.  Concerns regarding medicines are outlined above.  No orders of the defined types were placed in this encounter.  No orders of the defined types were placed in this encounter.   There are no Patient Instructions on file for this visit.   Signed, Donato Heinz, MD  11/11/2018 8:07 AM    [place]

## 2018-11-23 ENCOUNTER — Ambulatory Visit: Payer: Medicare HMO | Admitting: Family Medicine

## 2018-11-23 ENCOUNTER — Emergency Department (HOSPITAL_COMMUNITY)
Admission: EM | Admit: 2018-11-23 | Discharge: 2018-11-24 | Disposition: A | Discharge status: Home / Self Care | Payer: Medicare HMO | Attending: Emergency Medicine | Admitting: Emergency Medicine

## 2018-11-23 ENCOUNTER — Other Ambulatory Visit: Payer: Self-pay

## 2018-11-23 ENCOUNTER — Encounter: Payer: Self-pay | Admitting: Family Medicine

## 2018-11-23 VITALS — BP 154/80 | HR 87 | Temp 98.1°F | Ht 62.0 in | Wt 132.8 lb

## 2018-11-23 DIAGNOSIS — J449 Chronic obstructive pulmonary disease, unspecified: Secondary | ICD-10-CM | POA: Insufficient documentation

## 2018-11-23 DIAGNOSIS — K644 Residual hemorrhoidal skin tags: Secondary | ICD-10-CM

## 2018-11-23 DIAGNOSIS — I10 Essential (primary) hypertension: Secondary | ICD-10-CM | POA: Diagnosis not present

## 2018-11-23 DIAGNOSIS — Z79899 Other long term (current) drug therapy: Secondary | ICD-10-CM | POA: Diagnosis not present

## 2018-11-23 DIAGNOSIS — I2510 Atherosclerotic heart disease of native coronary artery without angina pectoris: Secondary | ICD-10-CM | POA: Insufficient documentation

## 2018-11-23 DIAGNOSIS — Z7982 Long term (current) use of aspirin: Secondary | ICD-10-CM | POA: Diagnosis not present

## 2018-11-23 DIAGNOSIS — K6289 Other specified diseases of anus and rectum: Secondary | ICD-10-CM | POA: Diagnosis present

## 2018-11-23 DIAGNOSIS — R69 Illness, unspecified: Secondary | ICD-10-CM | POA: Diagnosis not present

## 2018-11-23 DIAGNOSIS — Z7901 Long term (current) use of anticoagulants: Secondary | ICD-10-CM | POA: Insufficient documentation

## 2018-11-23 DIAGNOSIS — F17210 Nicotine dependence, cigarettes, uncomplicated: Secondary | ICD-10-CM | POA: Insufficient documentation

## 2018-11-23 HISTORY — DX: Residual hemorrhoidal skin tags: K64.4

## 2018-11-23 MED ORDER — HYDROCORTISONE ACETATE 25 MG RE SUPP: 25.0000 mg | Freq: Two times a day (BID) | RECTAL | 0 refills | Status: DC

## 2018-11-23 MED ORDER — LIDOCAINE 5 % EX OINT: 1.0000 "application " | TOPICAL_OINTMENT | Freq: Three times a day (TID) | CUTANEOUS | 0 refills | Status: DC | PRN

## 2018-11-23 NOTE — ED Triage Notes (Signed)
Reports hemorrhoids X6 days, pain is so bad he can't sit.  Pt went to PCP today, tried medication but no relief.

## 2018-11-23 NOTE — Progress Notes (Addendum)
Shane Carrillo - 73 y.o. male MRN [id number]  Date of birth: 1945-04-10  Subjective Chief Complaint  Patient presents with  . Hemorrhoids    Has some pain, worse the past three days    HPI Shane Carrillo is a 73 y.o. male with history of hemorrhoids here today with complaint of rectal pain.  He reports that he has had increased pain that he believes is from hemorrhoids.  He has had problems with hemorrhoids in the past and reports pain is similar.  Pain is described as sharp.  Reports bowel movements have been normal but has pain with this.  He has seen minimal blood when wiping.  He has tried sitz bath without much improvement.   ROS:  A comprehensive ROS was completed and negative except as noted per HPI  Allergies  Allergen Reactions  . Aspirin Other (See Comments)    Reaction:  GI upset  Pt states that he is able to take the coated form.      Past Medical History:  Diagnosis Date  . Aortic ectasia, abdominal ([place]) 08/2016   On medicare screening u/s: Abdominal aortic atherosclerosis and mild ectasia. Maximal diameter 2.8 cm-->repeat u/s 5 yrs.  [name] BPH with obstruction/lower urinary tract symptoms   . Chronic cough    upper airway cough syndrome/irritable larynx syndrome->Dr. Melvyn Novas.  [name] COPD (chronic obstructive pulmonary disease) ([place])   . Coronary artery disease    Stent.    . DOE (dyspnea on exertion)    spirometry 02/2018 normal, but needs full PFT's per Dr. Melvyn Novas 03/2018.  No bronchodilators indicated as of 03/2018.  [name] GERD (gastroesophageal reflux disease)    Dysphagia  . Hypercholesterolemia    Intol of atorva and simva  . Hypertension   . PAD (peripheral artery disease) (H[facility])    Abd aortic athero  . Tobacco dependence   . Unintentional weight loss 2019   +Dysphagia,dec appetite/abd pain->pt was set to get EGD/colonoscopy late 2019 but he never followed up with GI to get this.    Past Surgical History:  Procedure Laterality Date  . CORONARY ANGIOPLASTY WITH  STENT PLACEMENT     Dr. Ouida Sills in [place], [place].  [name] FOOT SURGERY    . ROTATOR CUFF REPAIR      Social History   Socioeconomic History  . Marital status: Married    Spouse name: Not on file  . Number of children: Not on file  . Years of education: Not on file  . Highest education level: Not on file  Occupational History  . Not on file  Social Needs  . Financial resource strain: Not on file  . Food insecurity    Worry: Not on file    Inability: Not on file  . Transportation needs    Medical: Not on file    Non-medical: Not on file  Tobacco Use  . Smoking status: Current Every Day Smoker    Packs/day: 1.00    Years: 55.00    Pack years: 55.00    Types: Cigarettes  . Smokeless tobacco: Never Used  Substance and Sexual Activity  . Alcohol use: Not Currently  . Drug use: No  . Sexual activity: Not on file  Lifestyle  . Physical activity    Days per week: Not on file    Minutes per session: Not on file  . Stress: Not on file  Relationships  . Social Herbalist on phone: Not on file    Gets together:  Not on file    Attends religious service: Not on file    Active member of club or organization: Not on file    Attends meetings of clubs or organizations: Not on file    Relationship status: Not on file  Other Topics Concern  . Not on file  Social History Narrative   Married, 12 grown children.   Orig from [place], [place].   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   Tob: 55 pack-yr hx.  No alc, no drugs.    Family History  Problem Relation Age of Onset  . Cancer - Cervical Mother   . Colon cancer Mother   . Colon cancer Father   . Prostate cancer Father   . Diabetes Maternal Grandmother     Health Maintenance  Topic Date Due  . Hepatitis C Screening  1946/01/20  . Fecal DNA (Cologuard)  03/21/2019  . TETANUS/TDAP  01/07/2020  . INFLUENZA VACCINE  Discontinued  . PNA vac Low Risk Adult  Discontinued     ----------------------------------------------------------------------------------------------------------------------------------------------------------------------------------------------------------------- Physical Exam BP (!) 154/80 (BP Location: Right Arm)   Pulse 87   Temp 98.1 F (36.7 C)   Ht 5\' 2"  (1.575 m)   Wt 132 lb 12.8 oz (60.2 kg)   SpO2 99%   BMI 24.29 kg/m   Physical Exam Constitutional:      Appearance: Normal appearance.  Abdominal:     General: Abdomen is flat. There is no distension.     Palpations: Abdomen is soft.  Genitourinary:    Rectum: External hemorrhoid present.     Comments: Multiple hemorrhoids, do not appear thrombosed at this time.  He has quite a bit of pain with palpation and  He declines rectal exam or anoscopy due to pain.   Neurological:     Mental Status: He is alert.     ------------------------------------------------------------------------------------------------------------------------------------------------------------------------------------------------------------------- Assessment and Plan  Inflamed external hemorrhoid Inflamed, tender hemorrhoids.  Does not appear thrombosed at this time.  Discussed continued sitz bath and increasing fluid and fiber intake.   Rx for anusol, lidocaine and surgical referral.

## 2018-11-23 NOTE — Patient Instructions (Signed)
Hemorrhoids Hemorrhoids are swollen veins that may develop:  In the butt (rectum). These are called internal hemorrhoids.  Around the opening of the butt (anus). These are called external hemorrhoids. Hemorrhoids can cause pain, itching, or bleeding. Most of the time, they do not cause serious problems. They usually get better with diet changes, lifestyle changes, and other home treatments. What are the causes? This condition may be caused by:  Having trouble pooping (constipation).  Pushing hard (straining) to poop.  Watery poop (diarrhea).  Pregnancy.  Being very overweight (obese).  Sitting for long periods of time.  Heavy lifting or other activity that causes you to strain.  Anal sex.  Riding a bike for a long period of time. What are the signs or symptoms? Symptoms of this condition include:  Pain.  Itching or soreness in the butt.  Bleeding from the butt.  Leaking poop.  Swelling in the area.  One or more lumps around the opening of your butt. How is this diagnosed? A doctor can often diagnose this condition by looking at the affected area. The doctor may also:  Do an exam that involves feeling the area with a gloved hand (digital rectal exam).  Examine the area inside your butt using a small tube (anoscope).  Order blood tests. This may be done if you have lost a lot of blood.  Have you get a test that involves looking inside the colon using a flexible tube with a camera on the end (sigmoidoscopy or colonoscopy). How is this treated? This condition can usually be treated at home. Your doctor may tell you to change what you eat, make lifestyle changes, or try home treatments. If these do not help, procedures can be done to remove the hemorrhoids or make them smaller. These may involve:  Placing rubber bands at the base of the hemorrhoids to cut off their blood supply.  Injecting medicine into the hemorrhoids to shrink them.  Shining a type of light  energy onto the hemorrhoids to cause them to fall off.  Doing surgery to remove the hemorrhoids or cut off their blood supply. Follow these instructions at home: Eating and drinking   Eat foods that have a lot of fiber in them. These include whole grains, beans, nuts, fruits, and vegetables.  Ask your doctor about taking products that have added fiber (fibersupplements).  Reduce the amount of fat in your diet. You can do this by: ? Eating low-fat dairy products. ? Eating less red meat. ? Avoiding processed foods.  Drink enough fluid to keep your pee (urine) pale yellow. Managing pain and swelling   Take a warm-water bath (sitz bath) for 20 minutes to ease pain. Do this 3-4 times a day. You may do this in a bathtub or using a portable sitz bath that fits over the toilet.  If told, put ice on the painful area. It may be helpful to use ice between your warm baths. ? Put ice in a plastic bag. ? Place a towel between your skin and the bag. ? Leave the ice on for 20 minutes, 2-3 times a day. General instructions  Take over-the-counter and prescription medicines only as told by your doctor. ? Medicated creams and medicines may be used as told.  Exercise often. Ask your doctor how much and what kind of exercise is best for you.  Go to the bathroom when you have the urge to poop. Do not wait.  Avoid pushing too hard when you poop.  Keep your   butt dry and clean. Use wet toilet paper or moist towelettes after pooping.  Do not sit on the toilet for a long time.  Keep all follow-up visits as told by your doctor. This is important. Contact a doctor if you:  Have pain and swelling that do not get better with treatment or medicine.  Have trouble pooping.  Cannot poop.  Have pain or swelling outside the area of the hemorrhoids. Get help right away if you have:  Bleeding that will not stop. Summary  Hemorrhoids are swollen veins in the butt or around the opening of the butt.   They can cause pain, itching, or bleeding.  Eat foods that have a lot of fiber in them. These include whole grains, beans, nuts, fruits, and vegetables.  Take a warm-water bath (sitz bath) for 20 minutes to ease pain. Do this 3-4 times a day. This information is not intended to replace advice given to you by your health care provider. Make sure you discuss any questions you have with your health care provider. Document Released: 11/13/2007 Document Revised: 02/11/2018 Document Reviewed: 06/25/2017 Elsevier Patient Education  2020 [employer]  

## 2018-11-23 NOTE — Assessment & Plan Note (Addendum)
Inflamed, tender hemorrhoids.  Does not appear thrombosed at this time.  Discussed continued sitz bath and increasing fluid and fiber intake.   Rx for anusol, lidocaine and surgical referral.

## 2018-11-24 MED ORDER — ACETAMINOPHEN 500 MG PO TABS
1000.0000 mg | ORAL_TABLET | Freq: Once | ORAL | Status: AC
  Administered 2018-11-24: 1000 mg via ORAL
  Filled 2018-11-24: qty 2

## 2018-11-24 MED ORDER — LIDOCAINE HCL URETHRAL/MUCOSAL 2 % EX GEL
1.0000 "application " | Freq: Once | CUTANEOUS | Status: AC
  Administered 2018-11-24: 1 via TOPICAL
  Filled 2018-11-24: qty 20

## 2018-11-24 MED ORDER — HYDROCORTISONE ACETATE 25 MG RE SUPP
25.0000 mg | Freq: Once | RECTAL | Status: AC
  Administered 2018-11-24: 25 mg via RECTAL
  Filled 2018-11-24: qty 1

## 2018-11-24 NOTE — Discharge Instructions (Addendum)
For your hemorrhoids, I recommend that you use a stool softener such as Colace 100 mg twice a day to keep your bowel movements soft.  I also recommend that you do not use toilet paper and instead use witch hazel wipes or baby wipes. A high-fiber diet will also help to keep your stools soft as well increasing your water intake. If you cannot eat foods high in fiber, you may use Benefiber or Metamucil over-the-counter once daily. I also recommend over the counter preparation H for your hemorrhoids as well to help with inflammation.  I recommend that you pick up the medications prescribed by your primary care physician Dr. Zigmund Daniel.  You may take Tylenol 1000 mg every 6 hours as needed for pain.  This medication is found over-the-counter.

## 2018-11-24 NOTE — ED Provider Notes (Signed)
TIME SEEN: 2:53 AM  CHIEF COMPLAINT: Rectal pain  HPI: Patient is a 73 year old male with history of CAD, COPD, hypertension, hyperlipidemia who presents to the emergency department 2 weeks of rectal pain.  States he thinks it is from his hemorrhoids.  Has been using over-the-counter Preparation H suppositories without relief.  He states he does not see any bleeding.  No abdominal pain.  No vomiting.  Seen by his PCP yesterday and prescribed Anusol, lidocaine and given outpatient surgery referral.  States he does not have money until tomorrow and could not afford these medications.  Drove himself to the emergency department.  States he has not been constipated.  ROS: See HPI Constitutional: no fever  Eyes: no drainage  ENT: no runny nose   Cardiovascular:  no chest pain  Resp: no SOB  GI: no vomiting GU: no dysuria Integumentary: no rash  Allergy: no hives  Musculoskeletal: no leg swelling  Neurological: no slurred speech ROS otherwise negative  PAST MEDICAL HISTORY/PAST SURGICAL HISTORY:  Past Medical History:  Diagnosis Date  . Aortic ectasia, abdominal ([place]) 08/2016   On medicare screening u/s: Abdominal aortic atherosclerosis and mild ectasia. Maximal diameter 2.8 cm-->repeat u/s 5 yrs.  [name] BPH with obstruction/lower urinary tract symptoms   . Chronic cough    upper airway cough syndrome/irritable larynx syndrome->Dr. Melvyn Novas.  [name] COPD (chronic obstructive pulmonary disease) ([place])   . Coronary artery disease    Stent.    . DOE (dyspnea on exertion)    spirometry 02/2018 normal, but needs full PFT's per Dr. Melvyn Novas 03/2018.  No bronchodilators indicated as of 03/2018.  [name] GERD (gastroesophageal reflux disease)    Dysphagia  . Hypercholesterolemia    Intol of atorva and simva  . Hypertension   . PAD (peripheral artery disease) (H[facility])    Abd aortic athero  . Tobacco dependence   . Unintentional weight loss 2019   +Dysphagia,dec appetite/abd pain->pt was set to get EGD/colonoscopy late  2019 but he never followed up with GI to get this.    MEDICATIONS:  Prior to Admission medications   Medication Sig Start Date End Date Taking? Authorizing Provider  amLODipine (NORVASC) 5 MG tablet Take 1 tablet (5 mg total) by mouth daily. 10/10/18   Dixie Dials, MD  aspirin EC 81 MG EC tablet Take 1 tablet (81 mg total) by mouth daily. 10/11/18   Dixie Dials, MD  budesonide-formoterol (SYMBICORT) 160-4.5 MCG/ACT inhaler Take 1 puffs first thing in am and then another  1 puffs about 12 hours later. Patient taking differently: Inhale 1 puff into the lungs 2 (two) times daily as needed (shortness of breath).  03/25/18   Tanda Rockers, MD  clopidogrel (PLAVIX) 75 MG tablet Take 75 mg by mouth daily.    [provider]  docusate sodium (COLACE) 100 MG capsule Take 1 capsule (100 mg total) by mouth 2 (two) times daily. 10/10/18   Dixie Dials, MD  dutasteride (AVODART) 0.5 MG capsule Take 1 capsule (0.5 mg total) by mouth daily. 03/25/18   Tanda Rockers, MD  famotidine (PEPCID) 40 MG tablet Take 40 mg by mouth daily.    [provider]  gabapentin (NEURONTIN) 300 MG capsule Take 1 capsule (300 mg total) by mouth 2 (two) times daily. 03/25/18   Tanda Rockers, MD  hydrocortisone (ANUSOL-HC) 25 MG suppository Place 1 suppository (25 mg total) rectally 2 (two) times daily. 11/23/18   [name], DO  lidocaine (XYLOCAINE) 5 % ointment Apply 1 application  topically 3 (three) times daily as needed. 11/23/18   [name], DO  lisinopril (ZESTRIL) 20 MG tablet Take 20 mg by mouth daily.    [provider]  nitroGLYCERIN (NITROSTAT) 0.4 MG SL tablet Place 0.4 mg under the tongue every 5 (five) minutes as needed for chest pain.    [provider]  pantoprazole (PROTONIX) 40 MG tablet Take 40 mg by mouth daily.    [provider]  potassium chloride (K-DUR) 10 MEQ tablet Take 1 tablet (10 mEq total) by mouth daily. 10/10/18   Dixie Dials, MD  ranolazine  (RANEXA) 1000 MG SR tablet Take 1,000 mg by mouth 2 (two) times daily.    [provider]  rosuvastatin (CRESTOR) 20 MG tablet Take 1 tablet (20 mg total) by mouth daily at 6 PM. 10/10/18   Dixie Dials, MD  sildenafil (VIAGRA) 100 MG tablet Take 100 mg by mouth daily as needed for erectile dysfunction.    [provider]  tamsulosin (FLOMAX) 0.4 MG CAPS capsule Take 0.4 mg by mouth.    [provider]    ALLERGIES:  Allergies  Allergen Reactions  . Aspirin Other (See Comments)    Reaction:  GI upset  Pt states that he is able to take the coated form.      SOCIAL HISTORY:  Social History   Tobacco Use  . Smoking status: Current Every Day Smoker    Packs/day: 1.00    Years: 55.00    Pack years: 55.00    Types: Cigarettes  . Smokeless tobacco: Never Used  Substance Use Topics  . Alcohol use: Not Currently    FAMILY HISTORY: Family History  Problem Relation Age of Onset  . Cancer - Cervical Mother   . Colon cancer Mother   . Colon cancer Father   . Prostate cancer Father   . Diabetes Maternal Grandmother     EXAM: BP (!) 175/96 (BP Location: Right Arm)   Pulse 74   Temp 98.3 F (36.8 C) (Oral)   Resp 12   SpO2 100%  CONSTITUTIONAL: Alert and oriented and responds appropriately to questions.  Elderly, chronically ill-appearing HEAD: Normocephalic EYES: Conjunctivae clear, pupils appear equal, EOMI ENT: normal nose; moist mucous membranes NECK: Supple, no meningismus, no nuchal rigidity, no LAD  CARD: RRR; S1 and S2 appreciated; no murmurs, no clicks, no rubs, no gallops RESP: Normal chest excursion without splinting or tachypnea; breath sounds clear and equal bilaterally; no wheezes, no rhonchi, no rales, no hypoxia or respiratory distress, speaking full sentences ABD/GI: Normal bowel sounds; non-distended; soft, non-tender, no rebound, no guarding, no peritoneal signs, no hepatosplenomegaly RECTAL:  no gross blood or melena, several  external nonthrombosed hemorrhoids appreciated, no redness or warmth, no induration or drainage BACK:  The back appears normal and is non-tender to palpation, there is no CVA tenderness EXT: Normal ROM in all joints; non-tender to palpation; no edema; normal capillary refill; no cyanosis, no calf tenderness or swelling    SKIN: Normal color for age and race; warm; no rash NEURO: Moves all extremities equally PSYCH: The patient's mood and manner are appropriate. Grooming and personal hygiene are appropriate.  MEDICAL DECISION MAKING: Patient here with pain from nonthrombosed external hemorrhoids.  He is requesting that his hemorrhoids to be surgically removed tonight.  Discussed with patient that this is not an emergent procedure but he can follow-up with general surgery as an outpatient.  Discussed with patient that narcotic pain medication is not normally indicated for  hemorrhoids given they can cause constipation which can make symptoms worse.  Also discussed with patient that we cannot provide him with these medications if he drove to the ER.  He verbalized understanding.  He has not tried Tylenol or any other over-the-counter medications for pain other than Preparation H suppositories.  Will give Tylenol, apply lidocaine jelly to this area.  Also provide him with an Anusol suppository in the ED.  No rectal mass, abscess on exam.  Hemorrhoids are not thrombosed and therefore would not be amendable to incision and evacuation.  ED PROGRESS: Patient reports feeling better after medication.  Blood pressure has also improved now that his pain is controlled.  I feel he is safe to be discharged.  Provided with lidocaine jelly to take home until he can get his prescriptions filled tomorrow.  Recommended over-the-counter Tylenol as needed for pain.   At this time, I do not feel there is any life-threatening condition present. I have reviewed and discussed all results (EKG, imaging, lab, urine as appropriate)  and exam findings with patient/family. I have reviewed nursing notes and appropriate previous records.  I feel the patient is safe to be discharged home without further emergent workup and can continue workup as an outpatient as needed. Discussed usual and customary return precautions. Patient/family verbalize understanding and are comfortable with this plan.  Outpatient follow-up has been provided as needed. All questions have been answered.   Shane Carrillo was evaluated in Emergency Department on 11/24/2018 for the symptoms described in the history of present illness. He was evaluated in the context of the global COVID-19 pandemic, which necessitated consideration that the patient might be at risk for infection with the SARS-CoV-2 virus that causes COVID-19. Institutional protocols and algorithms that pertain to the evaluation of patients at risk for COVID-19 are in a state of rapid change based on information released by regulatory bodies including the CDC and federal and state organizations. These policies and algorithms were followed during the patient's care in the ED.    Thaddeus Evitts, Delice Bison, DO 11/24/18 [phone]

## 2018-11-24 NOTE — ED Notes (Signed)
Patient verbalizes understanding of discharge instructions. Opportunity for questioning and answers were provided. Armband removed by staff, pt discharged from ED ambulatory.   

## 2018-11-25 ENCOUNTER — Other Ambulatory Visit: Payer: Self-pay | Admitting: Family Medicine

## 2018-11-25 NOTE — Telephone Encounter (Signed)
Copied from [facility] [id number]. Topic: Quick Communication - Rx Refill/Question >> Nov 25, 2018  4:19 PM Leward Quan A wrote: Medication: lidocaine (XYLOCAINE) 5 % ointment   Per patient he is all out and in lots of pain again and say that this help  Has the patient contacted their pharmacy? Yes.   (Agent: If no, request that the patient contact the pharmacy for the refill.) (Agent: If yes, when and what did the pharmacy advise?)  Preferred Pharmacy (with phone number or street name): [facility], [place] [phone] (Phone) [phone] (Fax)    Agent: Please be advised that RX refills may take up to 3 business days. We ask that you follow-up with your pharmacy.

## 2018-11-25 NOTE — Telephone Encounter (Signed)
Requested medication (s) are due for refill today: no  Requested medication (s) are on the active medication list: yes  Last refill:  11/23/18  Future visit scheduled: No  Notes to clinic:  Medication ordered 2 days ago to be used 3 times daily as needed. Pt states he is currently out of medication and requesting for medication to be refilled due to experiencing a lot of pain.     Requested Prescriptions  Pending Prescriptions Disp Refills   lidocaine (XYLOCAINE) 5 % ointment 50 g 0    Sig: Apply 1 application topically 3 (three) times daily as needed.     Analgesics:  Topicals Passed - 11/25/2018  4:42 PM      Passed - Valid encounter within last 12 months    Recent Outpatient Visits          2 days ago Inflamed external hemorrhoid   [facility] [place], [place], DO   3 weeks ago Essential hypertension   [facility] [place], DO   4 months ago BPH with obstruction/lower urinary tract symptoms   [facility], Adrian [place], MD

## 2018-11-26 ENCOUNTER — Other Ambulatory Visit: Payer: Self-pay | Admitting: Family Medicine

## 2018-11-26 ENCOUNTER — Telehealth: Payer: Self-pay | Admitting: Surgery

## 2018-11-26 ENCOUNTER — Telehealth: Payer: Self-pay

## 2018-11-26 ENCOUNTER — Other Ambulatory Visit: Payer: Self-pay

## 2018-11-26 ENCOUNTER — Encounter (HOSPITAL_COMMUNITY): Payer: Self-pay | Admitting: Emergency Medicine

## 2018-11-26 ENCOUNTER — Emergency Department (HOSPITAL_COMMUNITY)
Admission: EM | Admit: 2018-11-26 | Discharge: 2018-11-26 | Disposition: A | Discharge status: Home / Self Care | Payer: Medicare HMO | Attending: Emergency Medicine | Admitting: Emergency Medicine

## 2018-11-26 DIAGNOSIS — K649 Unspecified hemorrhoids: Secondary | ICD-10-CM | POA: Diagnosis present

## 2018-11-26 DIAGNOSIS — R69 Illness, unspecified: Secondary | ICD-10-CM | POA: Diagnosis not present

## 2018-11-26 DIAGNOSIS — Z7901 Long term (current) use of anticoagulants: Secondary | ICD-10-CM | POA: Insufficient documentation

## 2018-11-26 DIAGNOSIS — Z7982 Long term (current) use of aspirin: Secondary | ICD-10-CM | POA: Diagnosis not present

## 2018-11-26 DIAGNOSIS — J449 Chronic obstructive pulmonary disease, unspecified: Secondary | ICD-10-CM | POA: Insufficient documentation

## 2018-11-26 DIAGNOSIS — F17210 Nicotine dependence, cigarettes, uncomplicated: Secondary | ICD-10-CM | POA: Insufficient documentation

## 2018-11-26 DIAGNOSIS — I2510 Atherosclerotic heart disease of native coronary artery without angina pectoris: Secondary | ICD-10-CM | POA: Insufficient documentation

## 2018-11-26 DIAGNOSIS — Z955 Presence of coronary angioplasty implant and graft: Secondary | ICD-10-CM | POA: Insufficient documentation

## 2018-11-26 DIAGNOSIS — K644 Residual hemorrhoidal skin tags: Secondary | ICD-10-CM

## 2018-11-26 DIAGNOSIS — Z79899 Other long term (current) drug therapy: Secondary | ICD-10-CM | POA: Insufficient documentation

## 2018-11-26 DIAGNOSIS — I10 Essential (primary) hypertension: Secondary | ICD-10-CM | POA: Insufficient documentation

## 2018-11-26 MED ORDER — LIDOCAINE HCL URETHRAL/MUCOSAL 2 % EX GEL
1.0000 "application " | Freq: Once | CUTANEOUS | Status: AC
  Administered 2018-11-26: 1 via TOPICAL
  Filled 2018-11-26: qty 20

## 2018-11-26 MED ORDER — LIDOCAINE 5 % EX OINT: 1.0000 "application " | TOPICAL_OINTMENT | Freq: Three times a day (TID) | CUTANEOUS | 0 refills | Status: DC | PRN

## 2018-11-26 MED ORDER — HYDROCORTISONE (PERIANAL) 2.5 % EX CREA: 1.0000 "application " | TOPICAL_CREAM | Freq: Three times a day (TID) | CUTANEOUS | 2 refills | Status: DC

## 2018-11-26 MED ORDER — LIDOCAINE HCL 2 % EX GEL: 1.0000 "application " | CUTANEOUS | 0 refills | Status: DC | PRN

## 2018-11-26 MED ORDER — LIDOCAINE HCL 2 % EX GEL: 1.0000 "application " | Freq: Three times a day (TID) | CUTANEOUS | 0 refills | Status: DC | PRN

## 2018-11-26 NOTE — Telephone Encounter (Signed)
Rx sent to pharm on file for lidocaine jelly

## 2018-11-26 NOTE — Telephone Encounter (Signed)
Pt states that he has called three pharmacies and none of them have this medication.  Pt needs help in being able to find it.  States that he is in a lot of pain right now and would like a call back ASAP. Pt can be reached at [phone]

## 2018-11-26 NOTE — Progress Notes (Signed)
Pt called office saying his pharmacy does not have lidocaine jelly in stock and will not be able to get it until Monday. Pt repeats multiple times that it is the only thing that has been effective in relieving the pain from his hemorrhoids. I advised that he could call other local pharmacies to see if they stock it and I could print Rx for him. Pt asked which pharmacy has lidocaine jelly in stock today and I explained I would have no way of knowing. He states anusol supp were cost prohibitive so he did not get. He has lidocaine ointment at home but it does not help. I sent rx for anusol 2.5% cream to pharm on file and pt will pick up and use along with lidocaine ointment. Advised him to cont with sitz baths. If pain worsens or cannot be controlled, he will need f/u in ER.

## 2018-11-26 NOTE — ED Notes (Signed)
Pt walked to the restroom.

## 2018-11-26 NOTE — Telephone Encounter (Signed)
Pt was seen in ED on 10/7 and walk into office in a lot of pain requesting a refill on lidocaine Jelly 2% not the onitment. Pt states this really worked for him. Advise patient Dr. Zigmund Daniel not in office but will send to Dr. Bryan Lemma for approval.

## 2018-11-26 NOTE — ED Provider Notes (Signed)
[facility] EMERGENCY DEPARTMENT Provider Note   CSN: [id number] Arrival date & time: 11/26/18  1904     History   Chief Complaint Chief Complaint  Patient presents with  . Hemorrhoids    HPI Shane Carrillo is a 73 y.o. male chest x-ray CAD, COPD, hypertension, hyperlipidemia presents for evaluation of continued and persistent rectal pain.  He was seen here on 11/24/2018 for evaluation of rectal pain.  At that time, he was found to have hemorrhoids.  He has been seen by his PCP who prescribed Anusol and lidocaine but states that that was not helping.  He came to the emergency department and had topical lidocaine jelly which he states improved his symptoms.  Patient reports that he had been using that at home but then ran out of it.  He states that since then, he has continued to have pain.  His last bowel movement was at 8:30 AM this morning.  No blood.  He has not noted any fevers.  He contacted outpatient general surgery and has an appointment with them on October 19. He denies any abdominal pain.      The history is provided by the patient.    Past Medical History:  Diagnosis Date  . Aortic ectasia, abdominal ([place]) 08/2016   On medicare screening u/s: Abdominal aortic atherosclerosis and mild ectasia. Maximal diameter 2.8 cm-->repeat u/s 5 yrs.  [name] BPH with obstruction/lower urinary tract symptoms   . Chronic cough    upper airway cough syndrome/irritable larynx syndrome->Dr. Melvyn Novas.  [name] COPD (chronic obstructive pulmonary disease) ([place])   . Coronary artery disease    Stent.    . DOE (dyspnea on exertion)    spirometry 02/2018 normal, but needs full PFT's per Dr. Melvyn Novas 03/2018.  No bronchodilators indicated as of 03/2018.  [name] GERD (gastroesophageal reflux disease)    Dysphagia  . Hypercholesterolemia    Intol of atorva and simva  . Hypertension   . PAD (peripheral artery disease) ([facility])    Abd aortic athero  . Tobacco dependence   . Unintentional weight loss  2019   +Dysphagia,dec appetite/abd pain->pt was set to get EGD/colonoscopy late 2019 but he never followed up with GI to get this.    Patient Active Problem List   Diagnosis Date Noted  . Inflamed external hemorrhoid 11/23/2018  . Unintended weight loss 11/02/2018  . Essential hypertension 11/02/2018  . Benign prostatic hyperplasia with urinary frequency 11/02/2018  . Abnormal TSH 11/02/2018  . CAD (coronary artery disease) 11/02/2018  . Acute coronary syndrome ([place]) 10/09/2018  . Cigarette smoker 02/26/2018  . DOE (dyspnea on exertion) 02/25/2018    Past Surgical History:  Procedure Laterality Date  . CORONARY ANGIOPLASTY WITH STENT PLACEMENT     Dr. Ouida Sills in [place], [place].  [name] FOOT SURGERY    . ROTATOR CUFF REPAIR          Home Medications    Prior to Admission medications   Medication Sig Start Date End Date Taking? Authorizing Provider  amLODipine (NORVASC) 5 MG tablet Take 1 tablet (5 mg total) by mouth daily. 10/10/18   Dixie Dials, MD  aspirin EC 81 MG EC tablet Take 1 tablet (81 mg total) by mouth daily. 10/11/18   Dixie Dials, MD  budesonide-formoterol (SYMBICORT) 160-4.5 MCG/ACT inhaler Take 1 puffs first thing in am and then another  1 puffs about 12 hours later. Patient taking differently: Inhale 1 puff into the lungs 2 (two) times daily as needed (shortness  of breath).  03/25/18   Tanda Rockers, MD  clopidogrel (PLAVIX) 75 MG tablet Take 75 mg by mouth daily.    [provider]  docusate sodium (COLACE) 100 MG capsule Take 1 capsule (100 mg total) by mouth 2 (two) times daily. 10/10/18   Dixie Dials, MD  dutasteride (AVODART) 0.5 MG capsule Take 1 capsule (0.5 mg total) by mouth daily. 03/25/18   Tanda Rockers, MD  famotidine (PEPCID) 40 MG tablet Take 40 mg by mouth daily.    [provider]  gabapentin (NEURONTIN) 300 MG capsule Take 1 capsule (300 mg total) by mouth 2 (two) times daily. 03/25/18   Tanda Rockers, MD  hydrocortisone  (ANUSOL-HC) 2.5 % rectal cream Place 1 application rectally 3 (three) times daily. 11/26/18   Cirigliano, Mary K, DO  lidocaine (XYLOCAINE) 2 % jelly Apply 1 application topically as needed. 11/26/18   Volanda Napoleon, PA-C  lisinopril (ZESTRIL) 20 MG tablet Take 20 mg by mouth daily.    [provider]  nitroGLYCERIN (NITROSTAT) 0.4 MG SL tablet Place 0.4 mg under the tongue every 5 (five) minutes as needed for chest pain.    [provider]  pantoprazole (PROTONIX) 40 MG tablet Take 40 mg by mouth daily.    [provider]  potassium chloride (K-DUR) 10 MEQ tablet Take 1 tablet (10 mEq total) by mouth daily. 10/10/18   Dixie Dials, MD  ranolazine (RANEXA) 1000 MG SR tablet Take 1,000 mg by mouth 2 (two) times daily.    [provider]  rosuvastatin (CRESTOR) 20 MG tablet Take 1 tablet (20 mg total) by mouth daily at 6 PM. 10/10/18   Dixie Dials, MD  sildenafil (VIAGRA) 100 MG tablet Take 100 mg by mouth daily as needed for erectile dysfunction.    [provider]  tamsulosin (FLOMAX) 0.4 MG CAPS capsule Take 0.4 mg by mouth.    [provider]    Family History Family History  Problem Relation Age of Onset  . Cancer - Cervical Mother   . Colon cancer Mother   . Colon cancer Father   . Prostate cancer Father   . Diabetes Maternal Grandmother     Social History Social History   Tobacco Use  . Smoking status: Current Every Day Smoker    Packs/day: 1.00    Years: 55.00    Pack years: 55.00    Types: Cigarettes  . Smokeless tobacco: Never Used  Substance Use Topics  . Alcohol use: Not Currently  . Drug use: No     Allergies   Aspirin   Review of Systems Review of Systems  Constitutional: Negative for fever.  Gastrointestinal: Negative for abdominal pain, blood in stool and constipation.  Genitourinary:       Rectal pain Hemorrhoids   All other systems reviewed and are negative.    Physical Exam Updated Vital  Signs BP (!) 196/84 (BP Location: Left Arm)   Pulse 76   Temp 98.5 F (36.9 C) (Oral)   Resp 18   SpO2 100%   Physical Exam Vitals signs and nursing note reviewed. Exam conducted with a chaperone present.  Constitutional:      Appearance: He is well-developed.  HENT:     Head: Normocephalic and atraumatic.  Eyes:     General: No scleral icterus.       Right eye: No discharge.        Left eye: No discharge.     Conjunctiva/sclera: Conjunctivae  normal.  Pulmonary:     Effort: Pulmonary effort is normal.  Genitourinary:    Rectum: External hemorrhoid present.     Comments: The exam was performed with a chaperone present. Several nonthrombosed external hemorrhoids.  No overlying warmth, erythema, edema.  No bleeding. Skin:    General: Skin is warm and dry.  Neurological:     Mental Status: He is alert.  Psychiatric:        Speech: Speech normal.        Behavior: Behavior normal.      ED Treatments / Results  Labs (all labs ordered are listed, but only abnormal results are displayed) Labs Reviewed - No data to display  EKG None  Radiology No results found.  Procedures Procedures (including critical care time)  Medications Ordered in ED Medications  lidocaine (XYLOCAINE) 2 % jelly 1 application (1 application Topical Given 11/26/18 2049)     Initial Impression / Assessment and Plan / ED Course  I have reviewed the triage vital signs and the nursing notes.  Pertinent labs & imaging results that were available during my care of the patient were reviewed by me and considered in my medical decision making (see chart for details).        73 year old male who presents for evaluation of continued and persistent rectal pain.  Seen here on 11/24/2018 for evaluation of hemorrhoids.  At that time, he was given topical lidocaine jelly which he states improved his pain.  He continues to have pain.  He has a transferred appointment on 12/05/2018. Patient is afebrile,  non-toxic appearing, sitting comfortably on examination table.  He is hypertensive on initial exam.  Vitals otherwise stable.  On rectal exam, he does have several nonthrombosed external hemorrhoids.  No surrounding warmth, erythema.  No indication for emergent excision at this time.  We will plan for lidocaine jelly and have her follow-up with outpatient gen surgery as directed.  History/physical exam not concerning for perirectal or perianal abscess. At this time, patient exhibits no emergent life-threatening condition that require further evaluation in ED or admission. Patient had ample opportunity for questions and discussion. All patient's questions were answered with full understanding. Strict return precautions discussed. Patient expresses understanding and agreement to plan.   Portions of this note were generated with [employer]. Dictation errors may occur despite best attempts at proofreading.   Final Clinical Impressions(s) / ED Diagnoses   Final diagnoses:  External hemorrhoids    ED Discharge Orders         Ordered    lidocaine (XYLOCAINE) 2 % jelly  As needed     11/26/18 2200           Desma Mcgregor 11/26/18 2209    Davonna Belling, MD 11/26/18 [phone]

## 2018-11-26 NOTE — ED Triage Notes (Signed)
Pt states he was seen in ED a few days ago after going to PCP to get medication for hemorrhoids and it burned too bad.  States pain was relieved with Lidocaine Jelly in the ED and he has went to pharmacy without being able to find anymore.

## 2018-11-26 NOTE — Addendum Note (Signed)
Addended by: Ronnald Nian on: 11/26/2018 02:12 PM   Modules accepted: Orders

## 2018-11-26 NOTE — Telephone Encounter (Signed)
Okay to refill? Dr. Zigmund Daniel is out today.

## 2018-11-26 NOTE — Telephone Encounter (Signed)
Patient advised to follow up with Dr. Zigmund Daniel.

## 2018-11-26 NOTE — Telephone Encounter (Signed)
Received call from patient who is requesting to purchase medication that he received here in the ED, CM explained that he was given prescriptions, but patient is asking for something else for anus pain, CM explained that he should contact his PCP. Patient verbalized understanding no further question or concerns voiced.

## 2018-11-26 NOTE — Discharge Instructions (Signed)
As we discussed, you can apply only lidocaine jelly as directed.  You should not use it for more than a few days as it can cause skin breakdown if you continue to persistently use it.  Follow-up with general surgery.  Return the emergency department for any worsening pain, fevers, abdominal pain, vomiting or any other worsening or concerning symptoms.

## 2018-11-26 NOTE — Telephone Encounter (Signed)
Pt called office saying his pharmacy does not have lidocaine jelly in stock and will not be able to get it until Monday. Pt repeats multiple times that it is the only thing that has been effective in relieving the pain from his hemorrhoids. I advised that he could call other local pharmacies to see if they stock it and I could print Rx for him. Pt asked which pharmacy has lidocaine jelly in stock today and I explained I would have no way of knowing. He states anusol supp were cost prohibitive so he did not get. He has lidocaine ointment at home but it does not help. I sent rx for anusol 2.5% cream to pharm on file and pt will pick up and use along with lidocaine ointment. Advised him to cont with sitz baths. He wonders if he can go to ER and ask for lidocaine jelly and be "in and out". I advised pt that plan is not really appropriate. If pain worsens or cannot be controlled, he will need f/u in ER.

## 2018-12-06 ENCOUNTER — Other Ambulatory Visit: Payer: Self-pay | Admitting: Surgery

## 2018-12-06 ENCOUNTER — Ambulatory Visit: Payer: Self-pay | Admitting: Surgery

## 2018-12-06 DIAGNOSIS — Z720 Tobacco use: Secondary | ICD-10-CM | POA: Diagnosis not present

## 2018-12-06 DIAGNOSIS — K644 Residual hemorrhoidal skin tags: Secondary | ICD-10-CM | POA: Diagnosis not present

## 2018-12-06 DIAGNOSIS — K602 Anal fissure, unspecified: Secondary | ICD-10-CM | POA: Diagnosis not present

## 2018-12-06 DIAGNOSIS — Z7901 Long term (current) use of anticoagulants: Secondary | ICD-10-CM | POA: Diagnosis not present

## 2018-12-06 DIAGNOSIS — K643 Fourth degree hemorrhoids: Secondary | ICD-10-CM | POA: Diagnosis not present

## 2018-12-06 DIAGNOSIS — K432 Incisional hernia without obstruction or gangrene: Secondary | ICD-10-CM | POA: Diagnosis not present

## 2018-12-06 NOTE — H&P (Signed)
[name] Documented: 12/06/2018 10:02 AM Location: [facility] Patient #: [id number] DOB: Jan 15, 1946 Married / Language: English / Race: Black or African American Male  History of Present Illness Adin Hector MD; 12/06/2018 12:42 PM) The patient is a 73 year old male who presents with anal pain. Note for "Anal pain": ` ` ` Patient sent for surgical consultation at the request of Davonna Belling, [facility]  Chief Complaint: Anal pain and hemorrhoids ` ` The patient is a management struggling with with internal hemorrhoid problems for many years. Usually has some discomfort with itching and stinging. Occasional blood but nothing too severe. He recalls a coloscopy in 2014. This was through the [facility] system. Was underwhelming except for hemorrhoids. Apparently had some unintentional weight loss and some other complaints. Was recommended by his gastroenterologist to get upper and lower endoscopy last year. He postponed. He notes his ex-wife as well as a niece had diet and was very stressful. He postponed it. He comes today with his current wife.  He notes he's been struggling with worsening pain and swelling and burning. Unbearable. He went to the emergency department. Hemorrhoid suspected. He was prescribed some lidocaine cream which he said helps. He does some warm soaks after bowel movements as well. Claims he moves his bowels about twice a day. Has appendix removed as a teenager but no other abdominal surgeries. No problems with urination or urinary tract infections. No personal nor family history of GI/colon cancer, inflammatory bowel disease, irritable bowel syndrome, allergy such as Celiac Sprue, dietary/dairy problems, colitis, ulcers nor gastritis. No recent sick contacts/gastroenteritis. No travel outside the country. No changes in diet. No dysphagia to solids or liquids. No significant heartburn or reflux. No hematochezia,  hematemesis, coffee ground emesis. No evidence of prior gastric/peptic ulceration. He smokes.  (Review of systems as stated in this history (HPI) or in the review of systems. Otherwise all other 12 point ROS are negative) ` ` `   Past Surgical History Nance Pew, CMA; 12/06/2018 10:02 AM) Foot Surgery Left.  Diagnostic Studies History Nance Pew, CMA; 12/06/2018 10:02 AM) Colonoscopy 1-5 years ago  Allergies Nance Pew, CMA; 12/06/2018 10:02 AM) No Known Allergies [12/06/2018]: No Known Drug Allergies [12/06/2018]: Allergies Reconciled  Medication History (Sabrina Canty, CMA; 12/06/2018 10:04 AM) Rosuvastatin Calcium (20MG  Tablet, Oral) Active. Potassium Chloride ER (10MEQ Tablet ER, Oral) Active. Pantoprazole Sodium (40MG  Tablet DR, Oral) Active. Lidocaine HCl Urethral/Mucosal (2% Gel, External) Active. Hydrocortisone (Perianal) (2.5% Cream, External) Active. Famotidine (40MG  Tablet, Oral) Active. DOK (100MG  Capsule, Oral) Active. amLODIPine Besylate (5MG  Tablet, Oral) Active. Viagra (100MG  Tablet, Oral) Active. Nitroglycerin (0.6MG  Tab Sublingual, Sublingual) Active. Medications Reconciled  Social History Nance Pew, CMA; 12/06/2018 10:02 AM) No caffeine use No drug use Tobacco use Current every day smoker.  Family History Nance Pew, [place]; 12/06/2018 10:02 AM) Cervical Cancer Mother. Colon Cancer Father, Mother. Heart Disease Father. Hypertension Father. Melanoma Father. Ovarian Cancer Mother. Prostate Cancer Father. Respiratory Condition Father.  Other Problems Nance Pew, CMA; 12/06/2018 10:02 AM) Gastroesophageal Reflux Disease Hemorrhoids High blood pressure Hypercholesterolemia Myocardial infarction     Review of Systems ([place]; 12/06/2018 10:02 AM) General Present- Appetite Loss, Night Sweats and Weight Loss. Not Present- Chills, Fatigue, Fever and Weight Gain. Skin Present-  Dryness and Hives. Not Present- Change in Wart/Mole, Jaundice, New Lesions, Non-Healing Wounds, Rash and Ulcer. HEENT Present- Wears glasses/contact lenses. Not Present- Earache, Hearing Loss, Hoarseness, Nose Bleed, Oral Ulcers, Ringing in the Ears, Seasonal  Allergies, Sinus Pain, Sore Throat, Visual Disturbances and Yellow Eyes. Respiratory Present- Snoring. Not Present- Bloody sputum, Chronic Cough, Difficulty Breathing and Wheezing. Cardiovascular Present- Leg Cramps and Shortness of Breath. Not Present- Chest Pain, Difficulty Breathing Lying Down, Palpitations, Rapid Heart Rate and Swelling of Extremities. Gastrointestinal Present- Excessive gas, Gets full quickly at meals, Hemorrhoids and Rectal Pain. Not Present- Abdominal Pain, Bloating, Bloody Stool, Change in Bowel Habits, Chronic diarrhea, Constipation, Difficulty Swallowing, Indigestion, Nausea and Vomiting. Male Genitourinary Present- Change in Urinary Stream. Not Present- Blood in Urine, Frequency, Impotence, Nocturia, Painful Urination, Urgency and Urine Leakage.  Vitals (Sabrina Canty CMA; 12/06/2018 10:05 AM) 12/06/2018 10:04 AM Weight: 134.2 lb Height: 64in Body Surface Area: 1.65 m Body Mass Index: 23.04 kg/m  Temp.: 98.61F(Temporal)  Pulse: 88 (Regular)  BP: 150/90 (Sitting, Left Arm, Standard)        Physical Exam Adin Hector MD; 12/06/2018 12:36 PM)  General Mental Status-Alert. General Appearance-Not in acute distress, Not Sickly. Orientation-Oriented X3. Hydration-Well hydrated. Voice-Normal.  Integumentary Global Assessment Upon inspection and palpation of skin surfaces of the - Axillae: non-tender, no inflammation or ulceration, no drainage. and Distribution of scalp and body hair is normal. General Characteristics Temperature - normal warmth is noted.  Head and Neck Head-normocephalic, atraumatic with no lesions or palpable masses. Face Global Assessment - atraumatic, no  absence of expression. Neck Global Assessment - no abnormal movements, no bruit auscultated on the right, no bruit auscultated on the left, no decreased range of motion, non-tender. Trachea-midline. Thyroid Gland Characteristics - non-tender.  Eye Eyeball - Left-Extraocular movements intact, No Nystagmus - Left. Eyeball - Right-Extraocular movements intact, No Nystagmus - Right. Cornea - Left-No Hazy - Left. Cornea - Right-No Hazy - Right. Sclera/Conjunctiva - Left-No scleral icterus, No Discharge - Left. Sclera/Conjunctiva - Right-No scleral icterus, No Discharge - Right. Pupil - Left-Direct reaction to light normal. Pupil - Right-Direct reaction to light normal.  ENMT Ears Pinna - Left - no drainage observed, no generalized tenderness observed. Pinna - Right - no drainage observed, no generalized tenderness observed. Nose and Sinuses External Inspection of the Nose - no destructive lesion observed. Inspection of the nares - Left - quiet respiration. Inspection of the nares - Right - quiet respiration. Mouth and Throat Lips - Upper Lip - no fissures observed, no pallor noted. Lower Lip - no fissures observed, no pallor noted. Nasopharynx - no discharge present. Oral Cavity/Oropharynx - Tongue - no dryness observed. Oral Mucosa - no cyanosis observed. Hypopharynx - no evidence of airway distress observed.  Chest and Lung Exam Inspection Movements - Normal and Symmetrical. Accessory muscles - No use of accessory muscles in breathing. Palpation Palpation of the chest reveals - Non-tender. Auscultation Breath sounds - Normal and Clear.  Cardiovascular Auscultation Rhythm - Regular. Murmurs & Other Heart Sounds - Auscultation of the heart reveals - No Murmurs and No Systolic Clicks.  Abdomen Inspection Inspection of the abdomen reveals - No Visible peristalsis and No Abnormal pulsations. Umbilicus - No Bleeding, No Urine drainage. Palpation/Percussion Palpation  and Percussion of the abdomen reveal - Soft, Non Tender, No Rebound tenderness, No Rigidity (guarding) and No Cutaneous hyperesthesia. Note: Abdomen soft. Nontender. Not distended. Epigastric hernia most likely an old chest tube site. 4 x 3 cm region. Sensitive but reducible No umbilical or incisional hernias. No guarding.  Male Genitourinary Sexual Maturity Tanner 5 - Adult hair pattern and Adult penile size and shape. Note: No inguinal hernias. Normal external genitalia. Epididymi, testes, and spermatic cords  normal without any masses.  Rectal Note: Perianal skin clear. Enlarged right posterior and left lateral hemorrhoids grade 3/4 with definite and external components.   Pain feel posterior midline with some fibrotic divet, suspicious for anal fissure. Increased sphincter tone.   Cannot do a more aggressive exam. Very sensitive. No obvious thrombosed external hemorrhoid. No abscess or fistula. No condyloma. No pilonidal disease.  Peripheral Vascular Upper Extremity Inspection - Left - No Cyanotic nailbeds - Left, Not Ischemic. Inspection - Right - No Cyanotic nailbeds - Right, Not Ischemic.  Neurologic Neurologic evaluation reveals -normal attention span and ability to concentrate, able to name objects and repeat phrases. Appropriate fund of knowledge , normal sensation and normal coordination. Mental Status Affect - not angry, not paranoid. Cranial Nerves-Normal Bilaterally. Gait-Normal.  Neuropsychiatric Mental status exam performed with findings of-able to articulate well with normal speech/language, rate, volume and coherence, thought content normal with ability to perform basic computations and apply abstract reasoning and no evidence of hallucinations, delusions, obsessions or homicidal/suicidal ideation.  Musculoskeletal Global Assessment Spine, Ribs and Pelvis - no instability, subluxation or laxity. Right Upper Extremity - no instability,  subluxation or laxity.  Lymphatic Head & Neck  General Head & Neck Lymphatics: Bilateral - Description - No Localized lymphadenopathy. Axillary  General Axillary Region: Bilateral - Description - No Localized lymphadenopathy. Femoral & Inguinal  Generalized Femoral & Inguinal Lymphatics: Left - Description - No Localized lymphadenopathy. Right - Description - No Localized lymphadenopathy.    Assessment & Plan Adin Hector MD; 12/06/2018 12:30 PM)  PROLAPSED INTERNAL HEMORRHOIDS, GRADE 4 (K64.3) Impression: Large chronically prolapsed hemorrhoid very sensitive without thrombosis or active bleeding at this time.  Think these are too far gone to try and do anything to treat in the office. He is far too was tested for that anyway. Recommended examination under general anesthesia, hemorrhoidal ligation/pexy, hemorrhoidectomy remove the excess tissue. Suspect at least 2 of the piles were need to be excised.  He was hoping I could just freeze them or do a laser in the office. I noted that was not realistic. His wife agreed that he needed surgery. Patient was hesitant to proceed. He was hoping I can renew the lidocaine. I noted I would recommend a few more times, but I did not think it was a good long-term solution. I again recommended surgery. He will think about things and let us know.  Operative note, it was recommended to have upper and lower endoscopy by his gastroenterologist last year. Recommended he reconsider that. He will think about  The anatomy & physiology of the anorectal region was discussed. The pathophysiology of hemorrhoids and differential diagnosis was discussed. Natural history progression was discussed. I stressed the importance of a bowel regimen to have daily soft bowel movements to minimize progression of disease. Goal of one BM / day ideal. Use of wet wipes, warm baths, avoiding straining, etc were emphasized.  Educational handouts further explaining the pathology,  treatment options, and bowel regimen were given as well. The patient expressed understanding.  Current Plans You are being scheduled for surgery- Our schedulers will call you.  You should hear from our office's scheduling department within 5 working days about the location, date, and time of surgery. We try to make accommodations for patient's preferences in scheduling surgery, but sometimes the OR schedule or the surgeon's schedule prevents [place] from making those accommodations.  If you have not heard from our office [phone]) in 5 working days, call the office and ask  for your surgeon's nurse.  If you have other questions about your diagnosis, plan, or surgery, call the office and ask for your surgeon's nurse.  Pt Education - Pamphlet Given - The Hemorrhoid Book: discussed with patient and provided information. Pt Education - [facility] Hemorrhoids (Kaleah Hagemeister): discussed with patient and provided information. The anatomy & physiology of the anorectal region was discussed. The pathophysiology of hemorrhoids and differential diagnosis was discussed. Natural history risks without surgery was discussed. I stressed the importance of a bowel regimen to have daily soft bowel movements to minimize progression of disease. Interventions such as sclerotherapy & banding were discussed.  The patient's symptoms are not adequately controlled by medicines and other non-operative treatments. I feel the risks & problems of no surgery outweigh the operative risks; therefore, I recommended surgery to treat the hemorrhoids by ligation, pexy, and possible resection.  Risks such as bleeding, infection, urinary difficulties, need for further treatment, heart attack, death, and other risks were discussed. I noted a good likelihood this will help address the problem. Goals of post-operative recovery were discussed as well. Possibility that this will not correct all symptoms was explained. Post-operative pain,  bleeding, constipation, and other problems after surgery were discussed. We will work to minimize complications. Educational handouts further explaining the pathology, treatment options, and bowel regimen were given as well. Questions were answered. The patient expresses understanding & wishes to proceed with surgery.  Pt Education - [facility] Rectal Prep for Anorectal outpatient/office surgery: discussed with patient and provided information. Pt Education - [facility] Rectal Surgery HCI (Tilak Oakley): discussed with patient and provided information.  EXTERNAL HEMORRHOIDS WITH COMPLICATION (0000000)  Current Plans Started Lidocaine 5 % External Ointment, 1 (one) Application three times daily, as needed, 1 Applicator, [id number], Ref. x3.  ANAL FISSURE (K60.2) Impression: I suspect that he has an anal fissure as well. Hartel examination. Recommended examination under anesthesia. Possible partial internal sphincterotomy.  Another option is consider diltiazem cream. However, because he needs surgery for his hemorrhoids I don't know if I would do that first. Plus he smokes and I think this is been going on for a while, so I am skeptical that it would work. He prefers to redo the lidocaine for now.  Current Plans Pt Education - [facility] Anal Fissure (Devonne Lalani) The anatomy & physiology of the anorectal region was discussed. The pathophysiology of anal fissure and differential diagnosis was discussed. Natural history progression was discussed. I stressed the importance of a bowel regimen to have daily soft bowel movements to minimize progression of disease.  The patient's condition is not adequately controlled. Non-operative treatment has not healed the fissure. Therefore, I recommended examination under anesthesia for better examination to confirm the diagnosis and treat by lateral internal sphincterotomy to relax the spasm better & allow the fissure to heal. Technique, benefits, alternatives were discussed. I  noted a good likelihood this will help address the problem. Risks such as bleeding, pain, incontinence, recurrence, heart attack, death, and other risks were discussed.  Educational handouts further explaining the pathology, treatment options, and bowel regimen were given as well. The patient expressed understanding & wishes to proceed with surgery.  You are being scheduled for surgery- Our schedulers will call you.  You should hear from our office's scheduling department within 5 working days about the location, date, and time of surgery. We try to make accommodations for patient's preferences in scheduling surgery, but sometimes the OR schedule or the surgeon's schedule prevents [place] from making those accommodations.  If you have  not heard from our office [phone]) in 5 working days, call the office and ask for your surgeon's nurse.  If you have other questions about your diagnosis, plan, or surgery, call the office and ask for your surgeon's nurse.   TOBACCO ABUSE (Z72.0) Impression: STOP SMOKING!  We talked to the patient about the dangers of smoking. We stressed that tobacco use dramatically increases the risk of peri-operative complications such as infection, tissue necrosis leaving to problems with incision/wound and organ healing, hernia, chronic pain, heart attack, stroke, DVT, pulmonary embolism, and death. We noted there are programs in our community to help stop smoking. Information was available.  Current Plans Pt Education - CCS STOP SMOKING!  ANTICOAGULATED (Z79.01) Impression: I would need to make sure he can come off his Plavix perioperatively before proceeding with surgery  Current Plans I recommended obtaining preoperative cardiac clearance. I am concerned about the health of the patient and the ability to tolerate the operation. Therefore, we will request clearance by cardiology to better assess operative risk & see if a reevaluation, further workup, etc is  needed. Also recommendations on how medications such as for anticoagulation and blood pressure should be managed/held/restarted after surgery. Pt Education - CCS Hold anticoagulation preoperatively  INCISIONAL HERNIA, WITHOUT OBSTRUCTION OR GANGRENE (K43.2) Impression: Small but obvious epigastric/subxiphoid incisional hernia. Not causing particular problems right now. I would hold off on any surgical repair, especially with him smoking. Certainly the anorectal fissure and hemorrhoid pain is the more pressing issue  Adin Hector, MD, FACS, MASCRS Gastrointestinal and Minimally Invasive Surgery  [facility] [address] [place], [place] [address] [phone] Main / Paging [phone] Fax

## 2018-12-07 ENCOUNTER — Telehealth: Payer: Self-pay

## 2018-12-07 NOTE — Telephone Encounter (Signed)
NOTES ON FILE FOR SURGICAL CLEARANCE

## 2019-01-04 ENCOUNTER — Other Ambulatory Visit: Payer: Self-pay | Admitting: Family Medicine

## 2019-01-04 NOTE — Progress Notes (Signed)
Cardiology Office Note:   Date:  01/05/2019  NAME:  Shane Carrillo    MRN: [id number] DOB:  Jun 22, 1945   PCP:  Shane Nutting, DO  Cardiologist:  Shane Field, MD   Referring MD: Shane Boston, MD   Chief Complaint  Patient presents with  . Pre-op Exam   History of Present Illness:   Shane Carrillo is a 73 y.o. male with a hx of CAD s/p 1v CABG (LIMA-LAD in 2008), HTN who is being seen today for the evaluation of preoperative assessment prior to hemorrhoid sugery at the request of Shane Boston, MD.  He has a history of single-vessel CABG with LIMA to LAD in 2008.  Most recent heart catheterization was performed in 2016 in [facility] with details delineated below, demonstrating nonobstructive CAD.  Most recent laboratory data shows is not diabetic and LDL cholesterol 94.  Today he reports no major symptoms.  He reports he is done well since his bypass surgery in 2008.  He reports he exercises 3 times per week, walking [employer].  He reports this walk is 1.25 miles.  He reports no symptoms of chest pain or trouble breathing with this.  He does report cramping and aching in his legs when he walks for long distances.  He has poor pulses on examination.  He is still smoking on and off half pack a day and has done so for 50+ years.  He reports he needs to quit.  Review of recent lipid profile shows LDL cholesterol is 94.  A1c is 5.7.  H L 62.  He reports no symptoms of lower extremity edema, orthopnea, PND.  He has no evidence of heart failure examination.  His blood pressure is a bit elevated today but he forgot to take his medications this morning.  He also request Viagra refills.  He is not taking any nitroglycerin recently.  He is on no long-acting nitrates.  Past Medical History: Past Medical History:  Diagnosis Date  . Aortic ectasia, abdominal ([place]) 08/2016   On medicare screening u/s: Abdominal aortic atherosclerosis and mild ectasia. Maximal diameter 2.8  cm-->repeat u/s 5 yrs.  [name] BPH with obstruction/lower urinary tract symptoms   . Chronic cough    upper airway cough syndrome/irritable larynx syndrome->Dr. Melvyn Novas.  [name] COPD (chronic obstructive pulmonary disease) ([place])   . Coronary artery disease    Stent.    . DOE (dyspnea on exertion)    spirometry 02/2018 normal, but needs full PFT's per Dr. Melvyn Novas 03/2018.  No bronchodilators indicated as of 03/2018.  [name] GERD (gastroesophageal reflux disease)    Dysphagia  . Hypercholesterolemia    Intol of atorva and simva  . Hypertension   . PAD (peripheral artery disease) (H[facility])    Abd aortic athero  . Tobacco dependence   . Unintentional weight loss 2019   +Dysphagia,dec appetite/abd pain->pt was set to get EGD/colonoscopy late 2019 but he never followed up with GI to get this.    Past Surgical History: Past Surgical History:  Procedure Laterality Date  . CORONARY ANGIOPLASTY WITH STENT PLACEMENT     Dr. Ouida Sills in [place], [place].  [name] FOOT SURGERY    . ROTATOR CUFF REPAIR      Current Medications: Current Meds  Medication Sig  . amLODipine (NORVASC) 5 MG tablet Take 1 tablet (5 mg total) by mouth daily.  [name] aspirin EC 81 MG EC tablet Take 1 tablet (81 mg total) by mouth daily.  . budesonide-formoterol (SYMBICORT) 160-4.5  MCG/ACT inhaler Take 1 puffs first thing in am and then another  1 puffs about 12 hours later. (Patient taking differently: Inhale 1 puff into the lungs 2 (two) times daily as needed (shortness of breath). )  . docusate sodium (COLACE) 100 MG capsule Take 1 capsule (100 mg total) by mouth 2 (two) times daily.  [name] dutasteride (AVODART) 0.5 MG capsule Take 1 capsule (0.5 mg total) by mouth daily.  . famotidine (PEPCID) 40 MG tablet Take 40 mg by mouth daily.  [name] gabapentin (NEURONTIN) 300 MG capsule Take 1 capsule (300 mg total) by mouth 2 (two) times daily.  . hydrocortisone (ANUSOL-HC) 2.5 % rectal cream Place 1 application rectally 3 (three) times daily.  [name] lidocaine (XYLOCAINE)  2 % jelly Apply 1 application topically as needed.  [name] lisinopril (ZESTRIL) 20 MG tablet Take 20 mg by mouth daily.  . pantoprazole (PROTONIX) 40 MG tablet Take 40 mg by mouth daily.  . potassium chloride (K-DUR) 10 MEQ tablet Take 1 tablet (10 mEq total) by mouth daily.  . rosuvastatin (CRESTOR) 20 MG tablet Take 1 tablet (20 mg total) by mouth daily at 6 PM.  . sildenafil (VIAGRA) 100 MG tablet Take 1 tablet (100 mg total) by mouth daily as needed for erectile dysfunction.  . tamsulosin (FLOMAX) 0.4 MG CAPS capsule Take 0.4 mg by mouth.  . [DISCONTINUED] clopidogrel (PLAVIX) 75 MG tablet Take 75 mg by mouth daily.  . [DISCONTINUED] nitroGLYCERIN (NITROSTAT) 0.4 MG SL tablet Place 0.4 mg under the tongue every 5 (five) minutes as needed for chest pain.  . [DISCONTINUED] ranolazine (RANEXA) 1000 MG SR tablet Take 1,000 mg by mouth 2 (two) times daily.  . [DISCONTINUED] sildenafil (VIAGRA) 100 MG tablet Take 100 mg by mouth daily as needed for erectile dysfunction.     Allergies:    Aspirin   Social History: Social History   Socioeconomic History  . Marital status: Married    Spouse name: Not on file  . Number of children: Not on file  . Years of education: Not on file  . Highest education level: Not on file  Occupational History  . Not on file  Social Needs  . Financial resource strain: Not on file  . Food insecurity    Worry: Not on file    Inability: Not on file  . Transportation needs    Medical: Not on file    Non-medical: Not on file  Tobacco Use  . Smoking status: Current Every Day Smoker    Packs/day: 1.00    Years: 55.00    Pack years: 55.00    Types: Cigarettes  . Smokeless tobacco: Never Used  Substance and Sexual Activity  . Alcohol use: Not Currently  . Drug use: No  . Sexual activity: Not on file  Lifestyle  . Physical activity    Days per week: Not on file    Minutes per session: Not on file  . Stress: Not on file  Relationships  . Social Product manager on phone: Not on file    Gets together: Not on file    Attends religious service: Not on file    Active member of club or organization: Not on file    Attends meetings of clubs or organizations: Not on file    Relationship status: Not on file  Other Topics Concern  . Not on file  Social History Narrative   Married, 12 grown children.   Orig from [place], [place].  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   Tob: 55 pack-yr hx.  No alc, no drugs.     Family History: The patient's family history includes Cancer - Cervical in his mother; Colon cancer in his father and mother; Diabetes in his maternal grandmother; Prostate cancer in his father.  ROS:   All other ROS reviewed and negative. Pertinent positives noted in the HPI.     EKGs/Labs/Other Studies Reviewed:   The following studies were personally reviewed by me today:  EKG:  EKG is ordered today.  The ekg ordered today demonstrates normal sinus rhythm, heart rate 77, prior anteroseptal infarct, nonspecific T wave changes noted in the inferior leads, and was personally reviewed by me.   LHC 05/29/2014 ([place], [address] [place]) 05/29/14: CATH - Dr. Glynis Smiles DATE OF PROCEDURE: 05/29/2014  INDICATIONS: Angina in a post CABG patient. HISTORY: This is a 73 year old AA man with h/o CAD, s/p CABG, HTN, tobacco use presented with angina which is typical. He did not rule in for MI by Troponins. He was recommended a cath in view of his unstable angina. LEFT VENTRICULAR HEMODYNAMIC DATA: 1. Aortic blood pressure 134/84 mmHg with a mean of 106 mmHg. 2. Left ventricular end-diastolic pressure (LVEDP) was 26 mmHg. ANGIOGRAPHIC DATA: 1. LM is a large caliber vessel that gives rise to the left anterior descending artery (LAD) and left circumflex vessels respectively. Angiographically, the left main coronary vessel appears normal without evidence of obstructive coronary artery disease. 2. LAD is a moderate size vessel that  tapers to termination at the apex. The left anterior descending artery (LAD) gives rise to multiple septal perforators and two diagonal branch vessels. Angiographically, the left anterior descending artery (LAD) appears to have a proximal hazy lesion that at the most is 40% and in fact may be just a bend in the LAD. 3. LCX is a large caliber vessel that gives rise to a single obtuse marginal branch vessel that bifurcates at his origin prior to entering the A-V groove circumflex continuation that rapidly tapers to its termination. Angiographically, the left circumflex system appears normal without evidence of obstructive coronary artery disease. 4. RCA is a large caliber vessel that is dominant in circulation giving rise to the posterior descending artery distally. Angiographically, the right coronary artery system appears normal without evidence of obstructive coronary artery disease. GRAFT ANGIOGRAPHY: LIMA is patent but is being under filled due to the competitive flow from native LAD. LIMA is seen to be filling retrograde during native Left coronary injections.  IMPRESSION: 1. Non flow limiting CAD in the proximal LAD. Patent LIMA to LAD. 2. Elevated LVEDP. RECOMMENDATIONS: 1. Continue aggressive risk factor modifications.   NM 10/10/2018 1. Findings suggestive of prior inferior wall infarct. No evidence of inducible ischemia. 2. Normal left ventricular wall motion. 3. Left ventricular ejection fraction 49% 4. Non invasive risk stratification*: Low  TTE 10/10/2018  1. The left ventricle has normal systolic function with an ejection fraction of 60-65%. The cavity size was normal. There is moderate concentric left ventricular hypertrophy. Left ventricular diastolic Doppler parameters are consistent with impaired  relaxation. No evidence of left ventricular regional wall motion abnormalities.  2. The right ventricle has normal systolic function. The cavity was normal. There is no increase in right  ventricular wall thickness.  3. The mitral valve is degenerative. Mild thickening of the mitral valve leaflet. Mild calcification of the mitral valve leaflet.  4. The aortic valve is tricuspid. Mild thickening of the  aortic valve. Mild calcification of the aortic valve. Aortic valve regurgitation is mild by color flow Doppler.  5. There is evidence of severe plaque in the ascending aorta.  Recent Labs: 02/25/2018: Pro B Natriuretic peptide (BNP) 66.0 10/29/2018: ALT 10; BUN 14; Creatinine, Ser 1.08; Hemoglobin 14.0; Platelets 259.0; Potassium 4.6; Sodium 140; TSH 1.92   Recent Lipid Panel    Component Value Date/Time   CHOL 166 10/10/2018 0450   TRIG 52 10/10/2018 0450   HDL 62 10/10/2018 0450   CHOLHDL 2.7 10/10/2018 0450   VLDL 10 10/10/2018 0450   LDLCALC 94 10/10/2018 0450    Physical Exam:   VS:  BP (!) 162/76   Pulse 77   Temp (!) 97.2 F (36.2 C)   Ht 5\' 4"  (1.626 m)   Wt 131 lb 12.8 oz (59.8 kg)   SpO2 98%   BMI 22.62 kg/m    Wt Readings from Last 3 Encounters:  01/05/19 131 lb 12.8 oz (59.8 kg)  11/26/18 132 lb (59.9 kg)  11/23/18 132 lb 12.8 oz (60.2 kg)    General: Well nourished, well developed, in no acute distress Heart: Atraumatic, normal size  Eyes: PEERLA, EOMI  Neck: Supple, no JVD Endocrine: No thryomegaly Cardiac: Normal S1, S2; RRR; no murmurs, rubs, or gallops Lungs: Clear to auscultation bilaterally, no wheezing, rhonchi or rales  Abd: Soft, nontender, no hepatomegaly  Ext: No edema, diminished pulses bilaterally Musculoskeletal: No deformities, BUE and BLE strength normal and equal Skin: Warm and dry, no rashes   Neuro: Alert and oriented to person, place, time, and situation, CNII-XII grossly intact, no focal deficits  Psych: Normal mood and affect   ASSESSMENT:   DALTEN DEMBEK is a 73 y.o. male who presents for the following: 1. Preoperative cardiovascular examination   2. Coronary artery disease involving coronary bypass graft of native  heart with angina pectoris ([place])   3. Coronary artery disease involving coronary bypass graft of native heart without angina pectoris   4. Essential hypertension   5. Claudication in peripheral vascular disease ([place])   6. Tobacco abuse   7. Erectile dysfunction, unspecified erectile dysfunction type     PLAN:   1. Preoperative cardiovascular examination - The Revised Cardiac Risk Index = 1 (CAD) which equates to 0.9% (low risk) estimated risk of perioperative myocardial infarction, pulmonary edema, ventricular fibrillation, cardiac arrest, or complete heart block.  - No further cardiac testing is recommended prior to surgery.  Most recent echocardiogram demonstrates normal left ventricular ejection fraction.  Most recent stress test demonstrates inferior infarction.  No evidence reversible ischemia.  Given the fact that he is able to complete greater than 4 METs without any limitations, he is okay for surgery. -I have stopped his Plavix.  He is on aspirin 81 mg daily and this can be stopped for surgery. - Our service is available as needed in the peri-operative period.    2. Coronary artery disease involving coronary bypass graft of native heart with angina pectoris ([place]) -Most recent echocardiogram with normal ejection fraction.  Most recent stress test with inferior infarction.  No evidence of ischemia.  He has no symptoms of angina.  We will continue his aspirin and stop his Plavix.  Recheck a lipid profile today and continue his Crestor 20 mg daily.  We will likely increase his Crestor if LDL is not less than 70.  Blood pressure not at goal as he did not take his medications.  He takes no nitroglycerin and is  on no long-acting nitrates.  It is okay for him to take Viagra and refills provided today.  He was counseled on the fact that if he takes any nitroglycerin he will need to wait 24 hours prior to any use of Viagra.  He was in agreement with this plan.  3. Essential hypertension -Blood  pressure elevated today but did not take medications.  When we see him back in 3 months we will ask him to take his medication before seeing [place].  4. Claudication in peripheral vascular disease ([place]) -He does report symptoms of claudication with exertion.  We will proceed with ABIs as well as arterial Dopplers.  This will not preclude him surgery.  He can proceed with surgery now.  5. Tobacco abuse -Smoking cessation counseling provided today  6. Erectile dysfunction, unspecified erectile dysfunction type -Refill for Viagra provided.  He is not taking nitroglycerin.  On no long-acting nitrates.  Was counseled about the fact that he cannot take this when on nitroglycerin.  Disposition: Return in about 3 months (around 04/07/2019).  Medication Adjustments/Labs and Tests Ordered: Current medicines are reviewed at length with the patient today.  Concerns regarding medicines are outlined above.  Orders Placed This Encounter  Procedures  . Lipid Profile  . EKG 12-Lead  . VAS [place] LOWER EXTREMITY ARTERIAL DUPLEX  . VAS [place] ABI WITH/WO TBI   Meds ordered this encounter  Medications  . sildenafil (VIAGRA) 100 MG tablet    Sig: Take 1 tablet (100 mg total) by mouth daily as needed for erectile dysfunction.    Dispense:  10 tablet    Refill:  2    Patient Instructions  Medication Instructions:  Your physician has recommended you make the following change in your medication:    STOP TAKING YOUR CLOPIDOGREL (PLAVIX)  STOP TAKING YOUR RANOLAZINE (RANEXA)  YOUR SILDENAFIL (VIAGRA) HAS BEEN SENT TO YOUR PHARMACY FOR REFILL  *If you need a refill on your cardiac medications before your next appointment, please call your pharmacy*  Lab Work: Your physician recommends that you return for a FASTING lipid profile TODAY. Please return to the office for lab work.You will not need an appointment for lab work. Make sure to fast after midnight on the day you choose to present for lab work. Please  present to the following [facility] location or any [facility] lab that is convenient for you with the lab slip included in this letter:  [place], [place] [address]  If you have labs (blood work) drawn today and your tests are completely normal, you will receive your results only by: [name] MyChart Message (if you have MyChart) OR . A paper copy in the mail If you have any lab test that is abnormal or we need to change your treatment, we will call you to review the results.  Testing/Procedures: Your physician has requested that you have an ankle brachial index (ABI). During this test an ultrasound and blood pressure cuff are used to evaluate the arteries that supply the arms and legs with blood. Allow thirty minutes for this exam. There are no restrictions or special instructions.  Your physician has requested that you have a lower or upper extremity arterial duplex. This test is an ultrasound of the arteries in the legs or arms. It looks at arterial blood flow in the legs and arms. Allow one hour for Lower and Upper Arterial scans. There are no restrictions or special instructions   Follow-Up: At [facility], you  and your health needs are our priority.  As part of our continuing mission to provide you with exceptional heart care, we have created designated Provider Care Teams.  These Care Teams include your primary Cardiologist (physician) and Advanced Practice Providers (APPs -  Physician Assistants and Nurse Practitioners) who all work together to provide you with the care you need, when you need it.  Your next appointment:   3 month(s)  The format for your next appointment:   Either In Person or Virtual  Provider:   You may see Shane Field, MD or one of the following Advanced Practice Providers on your designated Care Team:    Almyra Deforest, PA-C  Fabian Sharp, PA-C or   Roby Lofts, PA-C       Signed, Addison Naegeli. Audie Box, [place]  [address] [place], [place] [address] [phone]  01/05/2019 11:53 AM

## 2019-01-05 ENCOUNTER — Encounter: Payer: Self-pay | Admitting: Cardiovascular Disease

## 2019-01-05 ENCOUNTER — Ambulatory Visit: Payer: Medicare HMO | Admitting: Cardiovascular Disease

## 2019-01-05 ENCOUNTER — Other Ambulatory Visit: Payer: Self-pay

## 2019-01-05 VITALS — BP 162/76 | HR 77 | Temp 97.2°F | Ht 64.0 in | Wt 131.8 lb

## 2019-01-05 DIAGNOSIS — I25709 Atherosclerosis of coronary artery bypass graft(s), unspecified, with unspecified angina pectoris: Secondary | ICD-10-CM

## 2019-01-05 DIAGNOSIS — N529 Male erectile dysfunction, unspecified: Secondary | ICD-10-CM

## 2019-01-05 DIAGNOSIS — Z720 Tobacco use: Secondary | ICD-10-CM | POA: Diagnosis not present

## 2019-01-05 DIAGNOSIS — I25810 Atherosclerosis of coronary artery bypass graft(s) without angina pectoris: Secondary | ICD-10-CM

## 2019-01-05 DIAGNOSIS — I739 Peripheral vascular disease, unspecified: Secondary | ICD-10-CM

## 2019-01-05 DIAGNOSIS — Z01810 Encounter for preprocedural cardiovascular examination: Secondary | ICD-10-CM

## 2019-01-05 DIAGNOSIS — I10 Essential (primary) hypertension: Secondary | ICD-10-CM

## 2019-01-05 LAB — LIPID PANEL
Chol/HDL Ratio: 2.4 ratio (ref 0.0–5.0)
Cholesterol, Total: 186 mg/dL (ref 100–199)
HDL: 76 mg/dL (ref >39)
LDL Chol Calc (NIH): 97 mg/dL (ref 0–99)
Triglycerides: 72 mg/dL (ref 0–149)
VLDL Cholesterol Cal: 13 mg/dL (ref 5–40)

## 2019-01-05 MED ORDER — SILDENAFIL CITRATE 100 MG PO TABS: 100.0000 mg | ORAL_TABLET | Freq: Every day | ORAL | 2 refills | Status: DC | PRN

## 2019-01-05 NOTE — Patient Instructions (Signed)
Medication Instructions:  Your physician has recommended you make the following change in your medication:    STOP TAKING YOUR CLOPIDOGREL (PLAVIX)  STOP TAKING YOUR RANOLAZINE (RANEXA)  YOUR SILDENAFIL (VIAGRA) HAS BEEN SENT TO YOUR PHARMACY FOR REFILL  *If you need a refill on your cardiac medications before your next appointment, please call your pharmacy*  Lab Work: Your physician recommends that you return for a FASTING lipid profile TODAY. Please return to the office for lab work.You will not need an appointment for lab work. Make sure to fast after midnight on the day you choose to present for lab work. Please present to the following [facility] location or any [facility] lab that is convenient for you with the lab slip included in this letter:  [place], [place] [address]  If you have labs (blood work) drawn today and your tests are completely normal, you will receive your results only by: [name] MyChart Message (if you have MyChart) OR . A paper copy in the mail If you have any lab test that is abnormal or we need to change your treatment, we will call you to review the results.  Testing/Procedures: Your physician has requested that you have an ankle brachial index (ABI). During this test an ultrasound and blood pressure cuff are used to evaluate the arteries that supply the arms and legs with blood. Allow thirty minutes for this exam. There are no restrictions or special instructions.  Your physician has requested that you have a lower or upper extremity arterial duplex. This test is an ultrasound of the arteries in the legs or arms. It looks at arterial blood flow in the legs and arms. Allow one hour for Lower and Upper Arterial scans. There are no restrictions or special instructions   Follow-Up: At [facility], you and your health needs are our priority.  As part of our continuing mission to provide you with exceptional heart care, we have created  designated Provider Care Teams.  These Care Teams include your primary Cardiologist (physician) and Advanced Practice Providers (APPs -  Physician Assistants and Nurse Practitioners) who all work together to provide you with the care you need, when you need it.  Your next appointment:   3 month(s)  The format for your next appointment:   Either In Person or Virtual  Provider:   You may see Evalina Field, MD or one of the following Advanced Practice Providers on your designated Care Team:    Almyra Deforest, PA-C  Fabian Sharp, PA-C or   Roby Lofts, [place]

## 2019-01-06 ENCOUNTER — Telehealth: Payer: Self-pay | Admitting: Cardiovascular Disease

## 2019-01-06 ENCOUNTER — Other Ambulatory Visit: Payer: Self-pay

## 2019-01-06 MED ORDER — ROSUVASTATIN CALCIUM 40 MG PO TABS: 40.0000 mg | ORAL_TABLET | Freq: Every day | ORAL | 3 refills | Status: DC

## 2019-01-06 MED ORDER — TAMSULOSIN HCL 0.4 MG PO CAPS: 0.4000 mg | ORAL_CAPSULE | Freq: Every day | ORAL | 1 refills | Status: DC

## 2019-01-06 NOTE — Telephone Encounter (Signed)
Attempted to call Mr. Stadelman about LDL being 97. We need to increase his crestor to 40 mg QHS. Left message and will have nursing staff get in touch.   [place] T. Audie Box, [place]  [address], [place], [place] [address] [phone]  1:14 PM

## 2019-01-07 ENCOUNTER — Telehealth: Payer: Self-pay | Admitting: Cardiovascular Disease

## 2019-01-10 ENCOUNTER — Other Ambulatory Visit: Payer: Self-pay | Admitting: Cardiovascular Disease

## 2019-01-10 DIAGNOSIS — R0989 Other specified symptoms and signs involving the circulatory and respiratory systems: Secondary | ICD-10-CM

## 2019-01-10 DIAGNOSIS — I739 Peripheral vascular disease, unspecified: Secondary | ICD-10-CM

## 2019-01-19 ENCOUNTER — Telehealth: Payer: Self-pay

## 2019-01-19 ENCOUNTER — Other Ambulatory Visit: Payer: Self-pay

## 2019-01-19 ENCOUNTER — Encounter: Payer: Self-pay | Admitting: Cardiovascular Disease

## 2019-01-19 ENCOUNTER — Ambulatory Visit (HOSPITAL_COMMUNITY)
Admission: RE | Admit: 2019-01-19 | Discharge: 2019-01-19 | Disposition: A | Discharge status: Home / Self Care | Payer: Medicare HMO | Source: Ambulatory Visit | Attending: Cardiovascular Disease | Admitting: Cardiovascular Disease

## 2019-01-19 DIAGNOSIS — I739 Peripheral vascular disease, unspecified: Secondary | ICD-10-CM | POA: Diagnosis present

## 2019-01-19 DIAGNOSIS — R0989 Other specified symptoms and signs involving the circulatory and respiratory systems: Secondary | ICD-10-CM | POA: Diagnosis not present

## 2019-01-19 MED ORDER — ROSUVASTATIN CALCIUM 40 MG PO TABS: 40.0000 mg | ORAL_TABLET | Freq: Every day | ORAL | 3 refills | Status: DC

## 2019-01-19 NOTE — Telephone Encounter (Signed)
Prior Auth started for Rx Rosuvastatin 40 mg tablets

## 2019-01-19 NOTE — Telephone Encounter (Signed)
Prior Auth approved for Rosuvastatin 40 mg tablets from 12/20/2018 through 01/19/2020. Called and spoke with patient to inform him of approval. Rx sent to Pharmacy.

## 2019-01-21 ENCOUNTER — Telehealth: Payer: Self-pay | Admitting: Cardiovascular Disease

## 2019-01-21 NOTE — Telephone Encounter (Signed)
Attempted to call Mr. Saucer. Significant PAD. Will need referral to Dr. Gwenlyn Found for vascular consultation.   [place] T. Audie Box, [place]  [address], [place], [place] [address] [phone]  2:34 PM

## 2019-01-21 NOTE — Telephone Encounter (Signed)
Received a call back from patient.Dr.O'Neal tried to call you.He wants you to see Dr.Berry for significant PAD.Appointment scheduled with Dr.Berry 01/26/19 at 3:15 pm.

## 2019-01-21 NOTE — Telephone Encounter (Signed)
Called patient no answer.LMTC. 

## 2019-01-23 ENCOUNTER — Emergency Department (HOSPITAL_COMMUNITY): Payer: Medicare HMO

## 2019-01-23 ENCOUNTER — Emergency Department (HOSPITAL_COMMUNITY)
Admission: EM | Admit: 2019-01-23 | Discharge: 2019-01-24 | Disposition: A | Discharge status: Home / Self Care | Payer: Medicare HMO | Attending: Emergency Medicine | Admitting: Emergency Medicine

## 2019-01-23 ENCOUNTER — Encounter (HOSPITAL_COMMUNITY): Payer: Self-pay | Admitting: Emergency Medicine

## 2019-01-23 ENCOUNTER — Other Ambulatory Visit: Payer: Self-pay

## 2019-01-23 DIAGNOSIS — F17210 Nicotine dependence, cigarettes, uncomplicated: Secondary | ICD-10-CM | POA: Insufficient documentation

## 2019-01-23 DIAGNOSIS — Z20828 Contact with and (suspected) exposure to other viral communicable diseases: Secondary | ICD-10-CM

## 2019-01-23 DIAGNOSIS — Z955 Presence of coronary angioplasty implant and graft: Secondary | ICD-10-CM | POA: Diagnosis not present

## 2019-01-23 DIAGNOSIS — R1011 Right upper quadrant pain: Secondary | ICD-10-CM | POA: Insufficient documentation

## 2019-01-23 DIAGNOSIS — I10 Essential (primary) hypertension: Secondary | ICD-10-CM | POA: Insufficient documentation

## 2019-01-23 DIAGNOSIS — R1010 Upper abdominal pain, unspecified: Secondary | ICD-10-CM | POA: Diagnosis present

## 2019-01-23 DIAGNOSIS — I2510 Atherosclerotic heart disease of native coronary artery without angina pectoris: Secondary | ICD-10-CM | POA: Insufficient documentation

## 2019-01-23 DIAGNOSIS — R059 Cough, unspecified: Secondary | ICD-10-CM

## 2019-01-23 DIAGNOSIS — J449 Chronic obstructive pulmonary disease, unspecified: Secondary | ICD-10-CM | POA: Diagnosis not present

## 2019-01-23 DIAGNOSIS — Z20822 Contact with and (suspected) exposure to covid-19: Secondary | ICD-10-CM

## 2019-01-23 DIAGNOSIS — Z79899 Other long term (current) drug therapy: Secondary | ICD-10-CM | POA: Insufficient documentation

## 2019-01-23 DIAGNOSIS — R05 Cough: Secondary | ICD-10-CM

## 2019-01-23 LAB — COMPREHENSIVE METABOLIC PANEL
ALT: 13 U/L (ref 0–44)
AST: 18 U/L (ref 15–41)
Albumin: 3.9 g/dL (ref 3.5–5.0)
Alkaline Phosphatase: 55 U/L (ref 38–126)
Anion gap: 10 (ref 5–15)
BUN: 8 mg/dL (ref 8–23)
CO2: 22 mmol/L (ref 22–32)
Calcium: 9 mg/dL (ref 8.9–10.3)
Chloride: 105 mmol/L (ref 98–111)
Creatinine, Ser: 1.04 mg/dL (ref 0.61–1.24)
GFR calc Af Amer: >60 mL/min (ref >60)
GFR calc non Af Amer: >60 mL/min (ref >60)
Glucose, Bld: 83 mg/dL (ref 70–99)
Potassium: 4 mmol/L (ref 3.5–5.1)
Sodium: 137 mmol/L (ref 135–145)
Total Bilirubin: 0.9 mg/dL (ref 0.3–1.2)
Total Protein: 6.8 g/dL (ref 6.5–8.1)

## 2019-01-23 LAB — CBC
HCT: 46.3 % (ref 39.0–52.0)
Hemoglobin: 14.8 g/dL (ref 13.0–17.0)
MCH: 28.1 pg (ref 26.0–34.0)
MCHC: 32 g/dL (ref 30.0–36.0)
MCV: 88 fL (ref 80.0–100.0)
Platelets: 314 10*3/uL (ref 150–400)
RBC: 5.26 MIL/uL (ref 4.22–5.81)
RDW: 14.7 % (ref 11.5–15.5)
WBC: 5.7 10*3/uL (ref 4.0–10.5)
nRBC: 0 % (ref 0.0–0.2)

## 2019-01-23 LAB — URINALYSIS, ROUTINE W REFLEX MICROSCOPIC
Bacteria, UA: NONE SEEN
Bilirubin Urine: NEGATIVE
Glucose, UA: NEGATIVE mg/dL
Hgb urine dipstick: NEGATIVE
Ketones, ur: 5 mg/dL — AB
Leukocytes,Ua: NEGATIVE
Nitrite: NEGATIVE
Protein, ur: 30 mg/dL — AB
Specific Gravity, Urine: 1.021 (ref 1.005–1.030)
pH: 6 (ref 5.0–8.0)

## 2019-01-23 LAB — LIPASE, BLOOD: Lipase: 27 U/L (ref 11–51)

## 2019-01-23 MED ORDER — SODIUM CHLORIDE 0.9% FLUSH: 3.0000 mL | Freq: Once | INTRAVENOUS | Status: DC

## 2019-01-23 NOTE — ED Triage Notes (Signed)
Pt c/o abd pain radiating around to back x 2 days, worse today. Denies n/v/d, denies fever, +cough, denies sick contact.

## 2019-01-23 NOTE — ED Notes (Signed)
Pt has gone outside at his moment

## 2019-01-24 ENCOUNTER — Emergency Department (HOSPITAL_COMMUNITY): Payer: Medicare HMO

## 2019-01-24 LAB — TROPONIN I (HIGH SENSITIVITY): Troponin I (High Sensitivity): 8 ng/L (ref <18)

## 2019-01-24 LAB — SARS CORONAVIRUS 2 (TAT 6-24 HRS): SARS Coronavirus 2: NEGATIVE

## 2019-01-24 MED ORDER — PANTOPRAZOLE SODIUM 40 MG PO TBEC
40.0000 mg | DELAYED_RELEASE_TABLET | Freq: Once | ORAL | Status: AC
  Administered 2019-01-24: 40 mg via ORAL
  Filled 2019-01-24: qty 1

## 2019-01-24 MED ORDER — ONDANSETRON 4 MG PO TBDP
4.0000 mg | ORAL_TABLET | Freq: Once | ORAL | Status: AC
  Administered 2019-01-24: 4 mg via ORAL
  Filled 2019-01-24: qty 1

## 2019-01-24 MED ORDER — ONDANSETRON HCL 4 MG/2ML IJ SOLN
4.0000 mg | Freq: Once | INTRAMUSCULAR | Status: DC
  Filled 2019-01-24: qty 2

## 2019-01-24 MED ORDER — PANTOPRAZOLE SODIUM 40 MG IV SOLR
40.0000 mg | Freq: Once | INTRAVENOUS | Status: DC
  Filled 2019-01-24: qty 40

## 2019-01-24 NOTE — ED Notes (Signed)
Patient verbalizes understanding of discharge instructions. Opportunity for questioning and answers were provided. Armband removed by staff, pt discharged from ED. Pt. ambulatory and discharged home.  

## 2019-01-24 NOTE — Discharge Instructions (Addendum)
You need to isolate yourself at your home until your coronavirus test results.  The results should be back in about 2 days.  We will call you if it is positive.  You can check online, or call back if you have not heard from [place] in 2 days.  If you run a fever or have shortness of breath please contact your doctor.  If your symptoms worsen significantly, return to the emergency department.  Tonight you had an ultrasound of your abdomen to look at your gallbladder, your aorta, and kidneys.  No abnormality was noted.  Your laboratory work-up was also good tonight.

## 2019-01-24 NOTE — ED Provider Notes (Signed)
[facility] EMERGENCY DEPARTMENT Provider Note   CSN: [id number] Arrival date & time: 01/23/19  1954     History   Chief Complaint Chief Complaint  Patient presents with   Abdominal Pain    HPI Shane Carrillo is a 73 y.o. male.     Patient presents to the emergency department with a chief complaint of upper abdominal pain.  He states that he has been coughing for the past day or so.  Reports that he has had some associated upper abdominal pain.  Denies any nausea, vomiting, or diarrhea.  Denies any burning sensation.  He states that he is also concerned that he may have had subjective fever.  He is afraid of having Covid.  Denies any known exposures.  The history is provided by the patient. No language interpreter was used.    Past Medical History:  Diagnosis Date   Aortic ectasia, abdominal ([place]) 08/2016   On medicare screening u/s: Abdominal aortic atherosclerosis and mild ectasia. Maximal diameter 2.8 cm-->repeat u/s 5 yrs.   BPH with obstruction/lower urinary tract symptoms    Chronic cough    upper airway cough syndrome/irritable larynx syndrome->Dr. Melvyn Novas.   COPD (chronic obstructive pulmonary disease) (HCC)    Coronary artery disease    Stent.     DOE (dyspnea on exertion)    spirometry 02/2018 normal, but needs full PFT's per Dr. Melvyn Novas 03/2018.  No bronchodilators indicated as of 03/2018.   GERD (gastroesophageal reflux disease)    Dysphagia   Hypercholesterolemia    Intol of atorva and simva   Hypertension    PAD (peripheral artery disease) (HCC)    Abd aortic athero   Tobacco dependence    Unintentional weight loss 2019   +Dysphagia,dec appetite/abd pain->pt was set to get EGD/colonoscopy late 2019 but he never followed up with GI to get this.    Patient Active Problem List   Diagnosis Date Noted   Inflamed external hemorrhoid 11/23/2018   Unintended weight loss 11/02/2018   Essential hypertension 11/02/2018   Benign  prostatic hyperplasia with urinary frequency 11/02/2018   Abnormal TSH 11/02/2018   CAD (coronary artery disease) 11/02/2018   Acute coronary syndrome ([place]) 10/09/2018   Cigarette smoker 02/26/2018   DOE (dyspnea on exertion) 02/25/2018    Past Surgical History:  Procedure Laterality Date   CORONARY ANGIOPLASTY WITH STENT PLACEMENT     Dr. Ouida Sills in [place], [place] Medications    Prior to Admission medications   Medication Sig Start Date End Date Taking? Authorizing Provider  albuterol (PROAIR HFA) 108 (90 Base) MCG/ACT inhaler Inhale 2 puffs into the lungs every 6 (six) hours as needed for wheezing or shortness of breath.    Yes [provider]  amLODipine (NORVASC) 5 MG tablet Take 1 tablet (5 mg total) by mouth daily. 10/10/18  Yes Dixie Dials, MD  aspirin EC 325 MG tablet Take 325 mg by mouth daily.   Yes [provider]  budesonide-formoterol (SYMBICORT) 160-4.5 MCG/ACT inhaler Take 1 puffs first thing in am and then another  1 puffs about 12 hours later. Patient taking differently: Inhale 1 puff into the lungs 2 (two) times daily as needed (for respiratory flares).  03/25/18  Yes Tanda Rockers, MD  docusate sodium (COLACE) 100 MG capsule Take 1 capsule (100 mg total) by mouth 2 (two) times daily. 10/10/18  Yes Dixie Dials, MD  hydrocortisone (ANUSOL-HC) 2.5 % rectal cream Place 1 application rectally 3 (three) times daily. Patient taking differently: Place 1 application rectally 3 (three) times daily as needed for hemorrhoids or anal itching.  11/26/18  Yes Cirigliano, Mary K, DO  lidocaine (XYLOCAINE) 2 % jelly Apply 1 application topically as needed. Patient taking differently: Apply 1 application topically as needed (for aching or pain).  11/26/18  Yes Providence Lanius A, PA-C  lisinopril (ZESTRIL) 20 MG tablet Take 20 mg by mouth daily.   Yes [provider]  naproxen sodium (ALEVE)  220 MG tablet Take 220-440 mg by mouth 2 (two) times daily as needed (for pain or headaches).   Yes [provider]  potassium chloride (K-DUR) 10 MEQ tablet Take 1 tablet (10 mEq total) by mouth daily. 10/10/18  Yes Dixie Dials, MD  rosuvastatin (CRESTOR) 40 MG tablet Take 1 tablet (40 mg total) by mouth daily. 01/19/19 04/19/19 Yes O'Neal, Cassie Freer, MD  sildenafil (VIAGRA) 100 MG tablet Take 1 tablet (100 mg total) by mouth daily as needed for erectile dysfunction. 01/05/19  Yes O'Neal, Cassie Freer, MD  tamsulosin (FLOMAX) 0.4 MG CAPS capsule Take 1 capsule (0.4 mg total) by mouth daily after supper. 01/06/19  Yes O'Neal, Cassie Freer, MD  aspirin EC 81 MG EC tablet Take 1 tablet (81 mg total) by mouth daily. Patient not taking: Reported on 01/24/2019 10/11/18   Dixie Dials, MD  dutasteride (AVODART) 0.5 MG capsule Take 1 capsule (0.5 mg total) by mouth daily. Patient not taking: Reported on 01/24/2019 03/25/18   Tanda Rockers, MD  famotidine (PEPCID) 40 MG tablet Take 40 mg by mouth daily as needed for heartburn or indigestion.     [provider]  gabapentin (NEURONTIN) 300 MG capsule Take 1 capsule (300 mg total) by mouth 2 (two) times daily. Patient not taking: Reported on 01/24/2019 03/25/18   Tanda Rockers, MD  pantoprazole (PROTONIX) 40 MG tablet Take 40 mg by mouth daily.    [provider]    Family History Family History  Problem Relation Age of Onset   Cancer - Cervical Mother    Colon cancer Mother    Colon cancer Father    Prostate cancer Father    Diabetes Maternal Grandmother     Social History Social History   Tobacco Use   Smoking status: Current Every Day Smoker    Packs/day: 1.00    Years: 55.00    Pack years: 55.00    Types: Cigarettes   Smokeless tobacco: Never Used  Substance Use Topics   Alcohol use: Not Currently   Drug use: No     Allergies   Aspirin   Review of Systems Review of Systems  All other  systems reviewed and are negative.    Physical Exam Updated Vital Signs BP (!) 154/96    Pulse (!) 59    Temp 98.4 F (36.9 C) (Oral)    Resp (!) 21    Ht 5\' 5"  (1.651 m)    Wt 59 kg    SpO2 100%    BMI 21.64 kg/m   Physical Exam Vitals signs and nursing note reviewed.  Constitutional:      Appearance: He is well-developed.  HENT:     Head: Normocephalic and atraumatic.  Eyes:     Conjunctiva/sclera: Conjunctivae normal.  Neck:     Musculoskeletal: Neck supple.  Cardiovascular:     Rate and Rhythm: Normal rate and regular  rhythm.     Heart sounds: No murmur.  Pulmonary:     Effort: Pulmonary effort is normal. No respiratory distress.     Breath sounds: Normal breath sounds.  Abdominal:     Palpations: Abdomen is soft.     Tenderness: There is no abdominal tenderness.  Musculoskeletal: Normal range of motion.  Skin:    General: Skin is warm and dry.  Neurological:     Mental Status: He is alert and oriented to person, place, and time.  Psychiatric:        Mood and Affect: Mood normal.        Behavior: Behavior normal.      ED Treatments / Results  Labs (all labs ordered are listed, but only abnormal results are displayed) Labs Reviewed  URINALYSIS, ROUTINE W REFLEX MICROSCOPIC - Abnormal; Notable for the following components:      Result Value   Ketones, ur 5 (*)    Protein, ur 30 (*)    All other components within normal limits  SARS CORONAVIRUS 2 (TAT 6-24 HRS)  LIPASE, BLOOD  COMPREHENSIVE METABOLIC PANEL  CBC  TROPONIN I (HIGH SENSITIVITY)  TROPONIN I (HIGH SENSITIVITY)    EKG EKG Interpretation  Date/Time:  Monday January 24 2019 00:59:48 EST Ventricular Rate:  66 PR Interval:    QRS Duration: 74 QT Interval:  380 QTC Calculation: 399 R Axis:   41 Text Interpretation: Sinus rhythm Probable left atrial enlargement Left ventricular hypertrophy Anterior infarct, old Abnormal T, consider ischemia, lateral leads No significant change since last  tracing in August 2020 Confirmed by Pryor Curia [phone]) on 01/24/2019 1:01:25 AM   Radiology Dg Chest 2 View  Result Date: 01/23/2019 CLINICAL DATA:  Rib pain status post cough EXAM: CHEST - 2 VIEW COMPARISON:  October 09, 2018 FINDINGS: The heart size and mediastinal contours are within normal limits. Both lungs are clear. The visualized skeletal structures are unremarkable. There are rounded densities bilaterally favored to represent nipple shadows as seen on prior chest x-ray. Degenerative changes are noted throughout the visualized thoracolumbar spine. The lungs are hyperexpanded. Aortic calcifications are noted. IMPRESSION: No active cardiopulmonary disease. Electronically Signed   By: Constance Holster M.D.   On: 01/23/2019 20:43   US Abdomen Complete  Result Date: 01/24/2019 CLINICAL DATA:  Upper abdominal pain EXAM: ABDOMEN ULTRASOUND COMPLETE COMPARISON:  None. FINDINGS: Gallbladder: No gallstones or wall thickening visualized. No sonographic Murphy sign noted by sonographer. There is evidence for adenomyomatosis. Common bile duct: Diameter: 4 mm Liver: No focal lesion identified. Within normal limits in parenchymal echogenicity. Portal vein is patent on color Doppler imaging with normal direction of blood flow towards the liver. IVC: No abnormality visualized. Pancreas: The pancreas was poorly visualized secondary to overlying bowel gas. Spleen: Size and appearance within normal limits. Right Kidney: Length: 9.8 cm. Echogenicity within normal limits. No mass or hydronephrosis visualized. Left Kidney: Length: 9.8 cm. Echogenicity within normal limits. No mass or hydronephrosis visualized. Abdominal aorta: No aneurysm visualized. Other findings: None. IMPRESSION: No acute sonographic abnormality detected. Electronically Signed   By: Constance Holster M.D.   On: 01/24/2019 02:28    Procedures Procedures (including critical care time)  Medications Ordered in ED Medications  sodium chloride  flush (NS) 0.9 % injection 3 mL (has no administration in time range)  ondansetron (ZOFRAN-ODT) disintegrating tablet 4 mg (4 mg Oral Given 01/24/19 0220)  pantoprazole (PROTONIX) EC tablet 40 mg (40 mg Oral Given 01/24/19 0220)     Initial  Impression / Assessment and Plan / ED Course  I have reviewed the triage vital signs and the nursing notes.  Pertinent labs & imaging results that were available during my care of the patient were reviewed by me and considered in my medical decision making (see chart for details).        Patient with cough and upper abdominal pain.  Patient seen by discussed with Dr. Leonides Schanz, who recommends ultrasound of abdomen.  Ultrasound shows no acute process.  Laboratory work-up is very reassuring.  Troponin is negative.  Symptoms have been ongoing for about 24 hours, do not feel that repeat troponin is indicated.  No ischemic EKG changes.  Doubt ACS.  Urinalysis negative.  Patient states that he is primarily concerned about Covid.  Will check coronavirus testing and have patient isolate at his house.  Discussed results with Dr. Leonides Schanz, who agrees with plan for discharge.  Final Clinical Impressions(s) / ED Diagnoses   Final diagnoses:  Cough  Encounter for laboratory testing for COVID-19 virus  Right upper quadrant abdominal pain    ED Discharge Orders    None       Montine Circle, PA-C 01/24/19 [place], Delice Bison, DO 01/24/19 [phone]

## 2019-01-26 ENCOUNTER — Ambulatory Visit: Payer: Medicare HMO | Admitting: Cardiovascular Disease

## 2019-01-26 ENCOUNTER — Other Ambulatory Visit: Payer: Self-pay

## 2019-01-26 ENCOUNTER — Encounter: Payer: Self-pay | Admitting: Cardiovascular Disease

## 2019-01-26 DIAGNOSIS — I739 Peripheral vascular disease, unspecified: Secondary | ICD-10-CM

## 2019-01-26 HISTORY — DX: Peripheral vascular disease, unspecified: I73.9

## 2019-01-26 MED ORDER — SODIUM CHLORIDE 0.9% FLUSH: 3.0000 mL | Freq: Two times a day (BID) | INTRAVENOUS | Status: DC

## 2019-01-26 NOTE — H&P (View-Only) (Signed)
01/26/2019 Shane Carrillo   1945/05/31  [id number]  Primary Physician Shane Nutting, DO Primary Cardiologist: Shane Harp MD Shane Carrillo, [place]  HPI:  Shane Carrillo is a 73 y.o. thin appearing married African-American male father of 58 children, grandfather of 19 grandchildren referred by Dr. Davina Carrillo for peripheral vascular valuation because of lifestyle limiting claudication.  He is retired from working in the [employer] in the [employer] in [place].  His risk factors include 60-pack-year tobacco abuse currently smoking a pack a day, treated hypertension hyperlipidemia.  He is never had a heart attack or stroke.  He has had bypass surgery in 2008.  He recently saw Dr. Davina Carrillo for preoperative clearance before hemorrhoid surgery by Dr. Johney Carrillo and had a low risk Myoview normal 2D echocardiogram.  He has had bilateral calf claudication for 6 months which is symmetric and lifestyle limiting.  Recent Dopplers performed 01/19/2019 revealed a right ABI of 0.97 and a left ABI of 0.92 with bilateral popliteal stenoses.   Current Meds  Medication Sig  . albuterol (PROAIR HFA) 108 (90 Base) MCG/ACT inhaler Inhale 2 puffs into the lungs every 6 (six) hours as needed for wheezing or shortness of breath.   [name] amLODipine (NORVASC) 5 MG tablet Take 1 tablet (5 mg total) by mouth daily.  [name] aspirin EC 325 MG tablet Take 325 mg by mouth daily.  [name] aspirin EC 81 MG EC tablet Take 1 tablet (81 mg total) by mouth daily.  . budesonide-formoterol (SYMBICORT) 160-4.5 MCG/ACT inhaler Take 1 puffs first thing in am and then another  1 puffs about 12 hours later. (Patient taking differently: Inhale 1 puff into the lungs 2 (two) times daily as needed (for respiratory flares). )  . docusate sodium (COLACE) 100 MG capsule Take 1 capsule (100 mg total) by mouth 2 (two) times daily.  [name] dutasteride (AVODART) 0.5 MG capsule Take 1 capsule (0.5 mg total) by mouth daily.  . famotidine (PEPCID) 40 MG tablet  Take 40 mg by mouth daily as needed for heartburn or indigestion.   . gabapentin (NEURONTIN) 300 MG capsule Take 1 capsule (300 mg total) by mouth 2 (two) times daily.  . hydrocortisone (ANUSOL-HC) 2.5 % rectal cream Place 1 application rectally 3 (three) times daily. (Patient taking differently: Place 1 application rectally 3 (three) times daily as needed for hemorrhoids or anal itching. )  . lidocaine (XYLOCAINE) 2 % jelly Apply 1 application topically as needed. (Patient taking differently: Apply 1 application topically as needed (for aching or pain). )  . lisinopril (ZESTRIL) 20 MG tablet Take 20 mg by mouth daily.  . naproxen sodium (ALEVE) 220 MG tablet Take 220-440 mg by mouth 2 (two) times daily as needed (for pain or headaches).  . pantoprazole (PROTONIX) 40 MG tablet Take 40 mg by mouth daily.  . potassium chloride (K-DUR) 10 MEQ tablet Take 1 tablet (10 mEq total) by mouth daily.  . rosuvastatin (CRESTOR) 40 MG tablet Take 1 tablet (40 mg total) by mouth daily.  . sildenafil (VIAGRA) 100 MG tablet Take 1 tablet (100 mg total) by mouth daily as needed for erectile dysfunction.  . tamsulosin (FLOMAX) 0.4 MG CAPS capsule Take 1 capsule (0.4 mg total) by mouth daily after supper.     Allergies  Allergen Reactions  . Aspirin Other (See Comments)    GI upset - Pt states that he is able to take the coated form     Social History  Socioeconomic History  . Marital status: Married    Spouse name: Not on file  . Number of children: Not on file  . Years of education: Not on file  . Highest education level: Not on file  Occupational History  . Not on file  Social Needs  . Financial resource strain: Not on file  . Food insecurity    Worry: Not on file    Inability: Not on file  . Transportation needs    Medical: Not on file    Non-medical: Not on file  Tobacco Use  . Smoking status: Current Every Day Smoker    Packs/day: 1.00    Years: 55.00    Pack years: 55.00    Types:  Cigarettes  . Smokeless tobacco: Never Used  Substance and Sexual Activity  . Alcohol use: Not Currently  . Drug use: No  . Sexual activity: Not on file  Lifestyle  . Physical activity    Days per week: Not on file    Minutes per session: Not on file  . Stress: Not on file  Relationships  . Social Herbalist on phone: Not on file    Gets together: Not on file    Attends religious service: Not on file    Active member of club or organization: Not on file    Attends meetings of clubs or organizations: Not on file    Relationship status: Not on file  . Intimate partner violence    Fear of current or ex partner: Not on file    Emotionally abused: Not on file    Physically abused: Not on file    Forced sexual activity: Not on file  Other Topics Concern  . Not on file  Social History Narrative   Married, 12 grown children.   Orig from [place], [place].   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   Tob: 55 pack-yr hx.  No alc, no drugs.     Review of Systems: General: negative for chills, fever, night sweats or weight changes.  Cardiovascular: negative for chest pain, dyspnea on exertion, edema, orthopnea, palpitations, paroxysmal nocturnal dyspnea or shortness of breath Dermatological: negative for rash Respiratory: negative for cough or wheezing Urologic: negative for hematuria Abdominal: negative for nausea, vomiting, diarrhea, bright red blood per rectum, melena, or hematemesis Neurologic: negative for visual changes, syncope, or dizziness All other systems reviewed and are otherwise negative except as noted above.    Blood pressure 140/80, pulse 78, temperature 97.7 F (36.5 C), height 5\' 5"  (1.651 m), weight 131 lb (59.4 kg).  General appearance: alert and no distress Neck: no adenopathy, no carotid bruit, no JVD, supple, symmetrical, trachea midline and thyroid not enlarged, symmetric, no tenderness/mass/nodules Lungs: clear to  auscultation bilaterally Heart: regular rate and rhythm, S1, S2 normal, no murmur, click, rub or gallop Extremities: extremities normal, atraumatic, no cyanosis or edema Pulses: Diminished pedal pulses bilaterally Skin: Skin color, texture, turgor normal. No rashes or lesions Neurologic: Alert and oriented X 3, normal strength and tone. Normal symmetric reflexes. Normal coordination and gait  EKG not performed today  ASSESSMENT AND PLAN:   Peripheral arterial disease [facility]) Mr. Lavinder was referred to me by Dr. Davina Carrillo first lifestyle limiting claudication.  He does have history of CAD status post bypass grafting in 2008.  He has risk factors including tobacco, hypertension hyperlipidemia.  He said bilateral calf claudication over the last 6 months with recent Doppler studies showing ABIs  of 0.97 on the right, 0.92 on the left with bilateral popliteal stenoses.  Patient wishes to proceed with outpatient PV angiography and endovascular therapy for lifestyle limiting claudication.      Shane Harp MD FACP,FACC,FAHA, [facility] 01/26/2019 3:54 PM

## 2019-01-26 NOTE — Assessment & Plan Note (Signed)
Shane Carrillo was referred to me by Dr. Davina Poke first lifestyle limiting claudication.  He does have history of CAD status post bypass grafting in 2008.  He has risk factors including tobacco, hypertension hyperlipidemia.  He said bilateral calf claudication over the last 6 months with recent Doppler studies showing ABIs of 0.97 on the right, 0.92 on the left with bilateral popliteal stenoses.  Patient wishes to proceed with outpatient PV angiography and endovascular therapy for lifestyle limiting claudication.

## 2019-01-26 NOTE — Patient Instructions (Addendum)
Medication Instructions:  Start taking 81mg  Aspirin Daily.   If you need a refill on your cardiac medications before your next appointment, please call your pharmacy.   Lab work: BMET, CBC If you have labs (blood work) drawn today and your tests are completely normal, you will receive your results only by: [place] (if you have MyChart) OR A paper copy in the mail If you have any lab test that is abnormal or we need to change your treatment, we will call you to review the results.  Testing/Procedures: Your physician has requested that you have a peripheral vascular angiogram. This exam is performed at the hospital. During this exam IV contrast is used to look at arterial blood flow. Please review the information sheet given for details.  AND  Your physician has requested that you have a lower extremity arterial exercise duplex 1 week after procedure 02/24/2019. During this test, exercise and ultrasound are used to evaluate arterial blood flow in the legs. Allow one hour for this exam. There are no restrictions or special instructions.  AND  Your physician has requested that you have an ankle brachial index (ABI) after procedure on 02/24/2019. During this test an ultrasound and blood pressure cuff are used to evaluate the arteries that supply the arms and legs with blood. Allow thirty minutes for this exam. There are no restrictions or special instructions.   Follow-Up: At [facility], you and your health needs are our priority.  As part of our continuing mission to provide you with exceptional heart care, we have created designated Provider Care Teams.  These Care Teams include your primary Cardiologist (physician) and Advanced Practice Providers (APPs -  Physician Assistants and Nurse Practitioners) who all work together to provide you with the care you need, when you need it. You may see Dr Gwenlyn Found or one of the following Advanced Practice Providers on your designated Care Team:     Kerin Ransom, PA-C  [place], [place]  Coletta Memos, [place] Your physician wants you to follow-up in: 2 weeks after procedure on 02/23/2018   Any Other Special Instructions Will Be Listed Below (If Applicable).  You are scheduled for a Peripheral Angiogram on Thursday, January 7 with Dr. Quay Burow.  1. Please arrive at the [facility] (Main Entrance A) at [facility]: [address] [place], [place] [address] at 5:30 AM (This time is two hours before your procedure to ensure your preparation). Free valet parking service is available.   Special note: Every effort is made to have your procedure done on time. Please understand that emergencies sometimes delay scheduled procedures.  2. Diet: Do not eat solid foods after midnight.  The patient may have clear liquids until 5am upon the day of the procedure.  3. Labs: You will need to have blood drawn today.  4. Medication instructions in preparation for your procedure:  On the morning of your procedure, take your Aspirin and any morning medicines NOT listed above.  You may use sips of water.  5. Plan for one night stay--bring personal belongings. 6. Bring a current list of your medications and current insurance cards. 7. You MUST have a responsible person to drive you home. 8. Someone MUST be with you the first 24 hours after you arrive home or your discharge will be delayed. 9. Please wear clothes that are easy to get on and off and wear slip-on shoes. You have to be tested for COVID prior to procedure. This will be scheduled  on January 4th 2021 at 12:30pm. This is a Drive Up Visit at the [facility] [address], [place]. Someone will direct you to the appropriate testing line. Stay in your car and someone will be with you shortly.  Thank you for allowing [place] to care for you!   -- [place] [facility]

## 2019-01-26 NOTE — Progress Notes (Signed)
01/26/2019 Shane Carrillo   [birth year]  [id number]  Primary Physician Shane Nutting, DO Primary Cardiologist: Shane Harp MD Shane Carrillo, [place]  HPI:  Shane Carrillo is a 73 y.o. thin appearing married African-American male father of 18 children, grandfather of 59 grandchildren referred by Dr. Davina Carrillo for peripheral vascular valuation because of lifestyle limiting claudication.  He is retired from working in the [employer] in the [employer] in [place].  His risk factors include 60-pack-year tobacco abuse currently smoking a pack a day, treated hypertension hyperlipidemia.  He is never had a heart attack or stroke.  He has had bypass surgery in 2008.  He recently saw Dr. Davina Carrillo for preoperative clearance before hemorrhoid surgery by Dr. Johney Carrillo and had a low risk Myoview normal 2D echocardiogram.  He has had bilateral calf claudication for 6 months which is symmetric and lifestyle limiting.  Recent Dopplers performed 01/19/2019 revealed a right ABI of 0.97 and a left ABI of 0.92 with bilateral popliteal stenoses.   Current Meds  Medication Sig  . albuterol (PROAIR HFA) 108 (90 Base) MCG/ACT inhaler Inhale 2 puffs into the lungs every 6 (six) hours as needed for wheezing or shortness of breath.   [name] amLODipine (NORVASC) 5 MG tablet Take 1 tablet (5 mg total) by mouth daily.  [name] aspirin EC 325 MG tablet Take 325 mg by mouth daily.  [name] aspirin EC 81 MG EC tablet Take 1 tablet (81 mg total) by mouth daily.  . budesonide-formoterol (SYMBICORT) 160-4.5 MCG/ACT inhaler Take 1 puffs first thing in am and then another  1 puffs about 12 hours later. (Patient taking differently: Inhale 1 puff into the lungs 2 (two) times daily as needed (for respiratory flares). )  . docusate sodium (COLACE) 100 MG capsule Take 1 capsule (100 mg total) by mouth 2 (two) times daily.  [name] dutasteride (AVODART) 0.5 MG capsule Take 1 capsule (0.5 mg total) by mouth daily.  . famotidine (PEPCID) 40 MG tablet  Take 40 mg by mouth daily as needed for heartburn or indigestion.   . gabapentin (NEURONTIN) 300 MG capsule Take 1 capsule (300 mg total) by mouth 2 (two) times daily.  . hydrocortisone (ANUSOL-HC) 2.5 % rectal cream Place 1 application rectally 3 (three) times daily. (Patient taking differently: Place 1 application rectally 3 (three) times daily as needed for hemorrhoids or anal itching. )  . lidocaine (XYLOCAINE) 2 % jelly Apply 1 application topically as needed. (Patient taking differently: Apply 1 application topically as needed (for aching or pain). )  . lisinopril (ZESTRIL) 20 MG tablet Take 20 mg by mouth daily.  . naproxen sodium (ALEVE) 220 MG tablet Take 220-440 mg by mouth 2 (two) times daily as needed (for pain or headaches).  . pantoprazole (PROTONIX) 40 MG tablet Take 40 mg by mouth daily.  . potassium chloride (K-DUR) 10 MEQ tablet Take 1 tablet (10 mEq total) by mouth daily.  . rosuvastatin (CRESTOR) 40 MG tablet Take 1 tablet (40 mg total) by mouth daily.  . sildenafil (VIAGRA) 100 MG tablet Take 1 tablet (100 mg total) by mouth daily as needed for erectile dysfunction.  . tamsulosin (FLOMAX) 0.4 MG CAPS capsule Take 1 capsule (0.4 mg total) by mouth daily after supper.     Allergies  Allergen Reactions  . Aspirin Other (See Comments)    GI upset - Pt states that he is able to take the coated form     Social History  Socioeconomic History  . Marital status: Married    Spouse name: Not on file  . Number of children: Not on file  . Years of education: Not on file  . Highest education level: Not on file  Occupational History  . Not on file  Social Needs  . Financial resource strain: Not on file  . Food insecurity    Worry: Not on file    Inability: Not on file  . Transportation needs    Medical: Not on file    Non-medical: Not on file  Tobacco Use  . Smoking status: Current Every Day Smoker    Packs/day: 1.00    Years: 55.00    Pack years: 55.00    Types:  Cigarettes  . Smokeless tobacco: Never Used  Substance and Sexual Activity  . Alcohol use: Not Currently  . Drug use: No  . Sexual activity: Not on file  Lifestyle  . Physical activity    Days per week: Not on file    Minutes per session: Not on file  . Stress: Not on file  Relationships  . Social Herbalist on phone: Not on file    Gets together: Not on file    Attends religious service: Not on file    Active member of club or organization: Not on file    Attends meetings of clubs or organizations: Not on file    Relationship status: Not on file  . Intimate partner violence    Fear of current or ex partner: Not on file    Emotionally abused: Not on file    Physically abused: Not on file    Forced sexual activity: Not on file  Other Topics Concern  . Not on file  Social History Narrative   Married, 12 grown children.   Orig from [place], [place].   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   Tob: 55 pack-yr hx.  No alc, no drugs.     Review of Systems: General: negative for chills, fever, night sweats or weight changes.  Cardiovascular: negative for chest pain, dyspnea on exertion, edema, orthopnea, palpitations, paroxysmal nocturnal dyspnea or shortness of breath Dermatological: negative for rash Respiratory: negative for cough or wheezing Urologic: negative for hematuria Abdominal: negative for nausea, vomiting, diarrhea, bright red blood per rectum, melena, or hematemesis Neurologic: negative for visual changes, syncope, or dizziness All other systems reviewed and are otherwise negative except as noted above.    Blood pressure 140/80, pulse 78, temperature 97.7 F (36.5 C), height 5\' 5"  (1.651 m), weight 131 lb (59.4 kg).  General appearance: alert and no distress Neck: no adenopathy, no carotid bruit, no JVD, supple, symmetrical, trachea midline and thyroid not enlarged, symmetric, no tenderness/mass/nodules Lungs: clear to  auscultation bilaterally Heart: regular rate and rhythm, S1, S2 normal, no murmur, click, rub or gallop Extremities: extremities normal, atraumatic, no cyanosis or edema Pulses: Diminished pedal pulses bilaterally Skin: Skin color, texture, turgor normal. No rashes or lesions Neurologic: Alert and oriented X 3, normal strength and tone. Normal symmetric reflexes. Normal coordination and gait  EKG not performed today  ASSESSMENT AND PLAN:   Peripheral arterial disease [facility]) Mr. Goetschius was referred to me by Dr. Davina Carrillo first lifestyle limiting claudication.  He does have history of CAD status post bypass grafting in 2008.  He has risk factors including tobacco, hypertension hyperlipidemia.  He said bilateral calf claudication over the last 6 months with recent Doppler studies showing ABIs  of 0.97 on the right, 0.92 on the left with bilateral popliteal stenoses.  Patient wishes to proceed with outpatient PV angiography and endovascular therapy for lifestyle limiting claudication.      Shane Harp MD FACP,FACC,FAHA, [facility] 01/26/2019 3:54 PM

## 2019-01-27 LAB — CBC
Hematocrit: 41.5 % (ref 37.5–51.0)
Hemoglobin: 13.5 g/dL (ref 13.0–17.7)
MCH: 28.4 pg (ref 26.6–33.0)
MCHC: 32.5 g/dL (ref 31.5–35.7)
MCV: 87 fL (ref 79–97)
Platelets: 251 10*3/uL (ref 150–450)
RBC: 4.76 x10E6/uL (ref 4.14–5.80)
RDW: 13.5 % (ref 11.6–15.4)
WBC: 6.5 10*3/uL (ref 3.4–10.8)

## 2019-01-27 LAB — BASIC METABOLIC PANEL
BUN/Creatinine Ratio: 19 (ref 10–24)
BUN: 21 mg/dL (ref 8–27)
CO2: 20 mmol/L (ref 20–29)
Calcium: 9.3 mg/dL (ref 8.6–10.2)
Chloride: 106 mmol/L (ref 96–106)
Creatinine, Ser: 1.11 mg/dL (ref 0.76–1.27)
GFR calc Af Amer: 76 mL/min/{1.73_m2} (ref >59)
GFR calc non Af Amer: 66 mL/min/{1.73_m2} (ref >59)
Glucose: 80 mg/dL (ref 65–99)
Potassium: 4.8 mmol/L (ref 3.5–5.2)
Sodium: 140 mmol/L (ref 134–144)

## 2019-02-01 ENCOUNTER — Ambulatory Visit (INDEPENDENT_AMBULATORY_CARE_PROVIDER_SITE_OTHER): Payer: Medicare HMO | Admitting: Family Medicine

## 2019-02-01 ENCOUNTER — Other Ambulatory Visit: Payer: Self-pay

## 2019-02-01 ENCOUNTER — Ambulatory Visit (INDEPENDENT_AMBULATORY_CARE_PROVIDER_SITE_OTHER): Payer: Medicare HMO

## 2019-02-01 ENCOUNTER — Encounter: Payer: Self-pay | Admitting: Family Medicine

## 2019-02-01 VITALS — BP 150/80 | HR 83 | Temp 96.4°F | Ht 65.0 in | Wt 132.8 lb

## 2019-02-01 DIAGNOSIS — M549 Dorsalgia, unspecified: Secondary | ICD-10-CM

## 2019-02-01 DIAGNOSIS — R109 Unspecified abdominal pain: Secondary | ICD-10-CM

## 2019-02-01 DIAGNOSIS — N529 Male erectile dysfunction, unspecified: Secondary | ICD-10-CM | POA: Diagnosis not present

## 2019-02-01 HISTORY — DX: Dorsalgia, unspecified: M54.9

## 2019-02-01 LAB — POCT URINALYSIS DIPSTICK
Bilirubin, UA: NEGATIVE
Blood, UA: NEGATIVE
Glucose, UA: NEGATIVE
Ketones, UA: NEGATIVE
Leukocytes, UA: NEGATIVE
Nitrite, UA: NEGATIVE
Protein, UA: POSITIVE — AB
Spec Grav, UA: 1.025 (ref 1.010–1.025)
Urobilinogen, UA: 0.2 E.U./dL
pH, UA: 6 (ref 5.0–8.0)

## 2019-02-01 MED ORDER — SILDENAFIL CITRATE 100 MG PO TABS: 100.0000 mg | ORAL_TABLET | Freq: Every day | ORAL | 2 refills | Status: DC | PRN

## 2019-02-01 NOTE — Progress Notes (Signed)
Please let patient know that xray shows possible kidney stone on L side.  I would like to get a CT scan to get a better look at this and determine if it is actually a stone.

## 2019-02-01 NOTE — Patient Instructions (Signed)
We'll be in touch with results of xray and urine test.

## 2019-02-01 NOTE — Assessment & Plan Note (Signed)
Discussed could be related to kidney stone vs MSK cause.  Will get UA and KUB.   He should drink plenty of fluids.  If no stone noted I wills send in trial of NSAID and muscle relaxer.

## 2019-02-01 NOTE — Progress Notes (Signed)
Shane Carrillo - 73 y.o. male MRN [id number]  Date of birth: 1945/10/14  Subjective Chief Complaint  Patient presents with  . Back Pain    Pt has lower mid back pain. 2wks. Pt was seen in the ED about 2wks ago.    HPI Shane Carrillo is a 73 y.o. male here today with complaint of mid back pain.  This started about 2 weeks ago.  He denies any known injury or overuse.  He was seen in ED around 12/6-12/7 and had CXR, abd [place] and labs which were relatively unremarkable.  His xray did show degenerative changes of the thoracolumbar spine.  He denies any urinary symptoms at this time.  He has not had fever, chills, chest pain, shortness of breath, dizziness.  Movement does seem to worsen the pain.    ROS:  A comprehensive ROS was completed and negative except as noted per HPI  Allergies  Allergen Reactions  . Aspirin Other (See Comments)    GI upset - Pt states that he is able to take the coated form     Past Medical History:  Diagnosis Date  . Aortic ectasia, abdominal ([place]) 08/2016   On medicare screening u/s: Abdominal aortic atherosclerosis and mild ectasia. Maximal diameter 2.8 cm-->repeat u/s 5 yrs.  [name] BPH with obstruction/lower urinary tract symptoms   . Chronic cough    upper airway cough syndrome/irritable larynx syndrome->Dr. Melvyn Novas.  [name] COPD (chronic obstructive pulmonary disease) ([place])   . Coronary artery disease    Stent.    . DOE (dyspnea on exertion)    spirometry 02/2018 normal, but needs full PFT's per Dr. Melvyn Novas 03/2018.  No bronchodilators indicated as of 03/2018.  [name] GERD (gastroesophageal reflux disease)    Dysphagia  . Hypercholesterolemia    Intol of atorva and simva  . Hypertension   . PAD (peripheral artery disease) (HCC)    Abd aortic athero  . Tobacco dependence   . Unintentional weight loss 2019   +Dysphagia,dec appetite/abd pain->pt was set to get EGD/colonoscopy late 2019 but he never followed up with GI to get this.    Past Surgical History:  Procedure  Laterality Date  . CORONARY ANGIOPLASTY WITH STENT PLACEMENT     Dr. Ouida Sills in [place], [place].  [name] FOOT SURGERY    . ROTATOR CUFF REPAIR      Social History   Socioeconomic History  . Marital status: Married    Spouse name: Not on file  . Number of children: Not on file  . Years of education: Not on file  . Highest education level: Not on file  Occupational History  . Not on file  Tobacco Use  . Smoking status: Current Every Day Smoker    Packs/day: 1.00    Years: 55.00    Pack years: 55.00    Types: Cigarettes  . Smokeless tobacco: Never Used  Substance and Sexual Activity  . Alcohol use: Not Currently  . Drug use: No  . Sexual activity: Not on file  Other Topics Concern  . Not on file  Social History Narrative   Married, 12 grown children.   Orig from [place], [place].   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   Tob: 55 pack-yr hx.  No alc, no drugs.   Social Determinants of Health   Financial Resource Strain:   . Difficulty of Paying Living Expenses: Not on file  Food Insecurity:   . Worried About Charity fundraiser in  the Last Year: Not on file  . Ran Out of Food in the Last Year: Not on file  Transportation Needs:   . Lack of Transportation (Medical): Not on file  . Lack of Transportation (Non-Medical): Not on file  Physical Activity:   . Days of Exercise per Week: Not on file  . Minutes of Exercise per Session: Not on file  Stress:   . Feeling of Stress : Not on file  Social Connections:   . Frequency of Communication with Friends and Family: Not on file  . Frequency of Social Gatherings with Friends and Family: Not on file  . Attends Religious Services: Not on file  . Active Member of Clubs or Organizations: Not on file  . Attends Archivist Meetings: Not on file  . Marital Status: Not on file    Family History  Problem Relation Age of Onset  . Cancer - Cervical Mother   . Colon cancer Mother   . Colon cancer  Father   . Prostate cancer Father   . Diabetes Maternal Grandmother     Health Maintenance  Topic Date Due  . Hepatitis C Screening  1945/05/01  . Fecal DNA (Cologuard)  03/21/2019  . TETANUS/TDAP  01/07/2020  . INFLUENZA VACCINE  Discontinued  . PNA vac Low Risk Adult  Discontinued    ----------------------------------------------------------------------------------------------------------------------------------------------------------------------------------------------------------------- Physical Exam BP (!) 150/80   Pulse 83   Temp (!) 96.4 F (35.8 C) (Temporal)   Ht 5\' 5"  (1.651 m)   Wt 132 lb 12.8 oz (60.2 kg)   SpO2 99%   BMI 22.10 kg/m   Physical Exam Constitutional:      Appearance: Normal appearance.  HENT:     Head: Normocephalic and atraumatic.  Eyes:     General: No scleral icterus. Cardiovascular:     Rate and Rhythm: Normal rate and regular rhythm.  Pulmonary:     Effort: Pulmonary effort is normal.     Breath sounds: Normal breath sounds.  Abdominal:     General: There is no distension.     Tenderness: There is no abdominal tenderness. There is no guarding.     Comments: Mild bilateral CVA tenderness  Musculoskeletal:     Comments: No midline spinal tenderness.  ROM is good but with some pain.    Skin:    General: Skin is warm and dry.  Neurological:     General: No focal deficit present.     Mental Status: He is alert.  Psychiatric:        Mood and Affect: Mood normal.        Behavior: Behavior normal.     ------------------------------------------------------------------------------------------------------------------------------------------------------------------------------------------------------------------- Assessment and Plan  Mid back pain Discussed could be related to kidney stone vs MSK cause.  Will get UA and KUB.   He should drink plenty of fluids.  If no stone noted I wills send in trial of NSAID and muscle relaxer.   This  visit occurred during the SARS-CoV-2 public health emergency.  Safety protocols were in place, including screening questions prior to the visit, additional usage of staff PPE, and extensive cleaning of exam room while observing appropriate contact time as indicated for disinfecting solutions.

## 2019-02-02 ENCOUNTER — Ambulatory Visit
Admission: RE | Admit: 2019-02-02 | Discharge: 2019-02-02 | Disposition: A | Discharge status: Home / Self Care | Payer: Medicare HMO | Source: Ambulatory Visit | Attending: Family Medicine | Admitting: Family Medicine

## 2019-02-02 DIAGNOSIS — R109 Unspecified abdominal pain: Secondary | ICD-10-CM

## 2019-02-02 DIAGNOSIS — M549 Dorsalgia, unspecified: Secondary | ICD-10-CM

## 2019-02-06 NOTE — Progress Notes (Signed)
CT scan does not show stone or other reason for his flank/back pain.

## 2019-02-07 ENCOUNTER — Ambulatory Visit (INDEPENDENT_AMBULATORY_CARE_PROVIDER_SITE_OTHER): Payer: Medicare HMO | Admitting: Family Medicine

## 2019-02-07 ENCOUNTER — Other Ambulatory Visit: Payer: Self-pay

## 2019-02-07 ENCOUNTER — Encounter: Payer: Self-pay | Admitting: Physician Assistant

## 2019-02-07 ENCOUNTER — Encounter: Payer: Self-pay | Admitting: Family Medicine

## 2019-02-07 VITALS — Ht 65.0 in

## 2019-02-07 DIAGNOSIS — K625 Hemorrhage of anus and rectum: Secondary | ICD-10-CM | POA: Diagnosis not present

## 2019-02-07 NOTE — Progress Notes (Signed)
Shane Carrillo - 73 y.o. male MRN [id number]  Date of birth: 01/19/1946   This visit type was conducted due to national recommendations for restrictions regarding the COVID-19 Pandemic (e.g. social distancing).  This format is felt to be most appropriate for this patient at this time.  All issues noted in this document were discussed and addressed.  No physical exam was performed (except for noted visual exam findings with Video Visits).  I discussed the limitations of evaluation and management by telemedicine and the availability of in person appointments. The patient expressed understanding and agreed to proceed.  I connected with@ on 02/07/19 at  2:00 PM EST by a video enabled telemedicine application and verified that I am speaking with the correct person using two identifiers.  Interactive audio and video telecommunications were attempted between this provider and patient, however failed, due to patient having technical difficulties OR patient did not have access to video capability.  We continued and completed visit with audio only.    Present at visit: [name], DO [name]   Patient Location: [place] [address]   Provider location:   [place]  Chief Complaint  Patient presents with  . Blood In Stools    x 1day.  Pt c/o having blood in stool.  Starting Saturday he had some pain at bottom of stomach.  Pt has not had any constipation     HPI  Shane CAMELO is a 73 y.o. male who presents via audio/video conferencing for a telehealth visit today.  He has complaint of rectal bleeding.  He reports that he noticed bleeding when wiping after BM yesterday.  Blood in toilet and stool.  Had BM today with similar symptoms.  He has history of severe hemorrhoids and has seen gen surgery regarding this.  [name] He feels like these have been pretty well controlled with lidocaine jelly and anusol as needed.  He denies abdominal pain, fever, chills  or nausea  He had cologuard in 2018 which was negative however was referred for colonoscopy last year due to weight loss.  Unfortunately he never went to this appt.  He has a family history of colon cancer in both mother and father.     ROS:  A comprehensive ROS was completed and negative except as noted per HPI  Past Medical History:  Diagnosis Date  . Aortic ectasia, abdominal ([place]) 08/2016   On medicare screening u/s: Abdominal aortic atherosclerosis and mild ectasia. Maximal diameter 2.8 cm-->repeat u/s 5 yrs.  [name] BPH with obstruction/lower urinary tract symptoms   . Chronic cough    upper airway cough syndrome/irritable larynx syndrome->Dr. Melvyn Novas.  [name] COPD (chronic obstructive pulmonary disease) ([place])   . Coronary artery disease    Stent.    . DOE (dyspnea on exertion)    spirometry 02/2018 normal, but needs full PFT's per Dr. Melvyn Novas 03/2018.  No bronchodilators indicated as of 03/2018.  [name] GERD (gastroesophageal reflux disease)    Dysphagia  . Hypercholesterolemia    Intol of atorva and simva  . Hypertension   . PAD (peripheral artery disease) (H[facility])    Abd aortic athero  . Tobacco dependence   . Unintentional weight loss 2019   +Dysphagia,dec appetite/abd pain->pt was set to get EGD/colonoscopy late 2019 but he never followed up with GI to get this.    Past Surgical History:  Procedure Laterality Date  . CORONARY ANGIOPLASTY WITH STENT PLACEMENT     Dr. Ouida Sills in  [place], [place].  [name] FOOT SURGERY    . ROTATOR CUFF REPAIR      Family History  Problem Relation Age of Onset  . Cancer - Cervical Mother   . Colon cancer Mother   . Colon cancer Father   . Prostate cancer Father   . Diabetes Maternal Grandmother     Social History   Socioeconomic History  . Marital status: Married    Spouse name: Not on file  . Number of children: Not on file  . Years of education: Not on file  . Highest education level: Not on file  Occupational History  . Not on file  Tobacco Use  .  Smoking status: Current Every Day Smoker    Packs/day: 1.00    Years: 55.00    Pack years: 55.00    Types: Cigarettes  . Smokeless tobacco: Never Used  Substance and Sexual Activity  . Alcohol use: Not Currently  . Drug use: No  . Sexual activity: Not on file  Other Topics Concern  . Not on file  Social History Narrative   Married, 12 grown children.   Orig from [place], [place].   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   Tob: 55 pack-yr hx.  No alc, no drugs.   Social Determinants of Health   Financial Resource Strain:   . Difficulty of Paying Living Expenses: Not on file  Food Insecurity:   . Worried About Charity fundraiser in the Last Year: Not on file  . Ran Out of Food in the Last Year: Not on file  Transportation Needs:   . Lack of Transportation (Medical): Not on file  . Lack of Transportation (Non-Medical): Not on file  Physical Activity:   . Days of Exercise per Week: Not on file  . Minutes of Exercise per Session: Not on file  Stress:   . Feeling of Stress : Not on file  Social Connections:   . Frequency of Communication with Friends and Family: Not on file  . Frequency of Social Gatherings with Friends and Family: Not on file  . Attends Religious Services: Not on file  . Active Member of Clubs or Organizations: Not on file  . Attends Archivist Meetings: Not on file  . Marital Status: Not on file  Intimate Partner Violence:   . Fear of Current or Ex-Partner: Not on file  . Emotionally Abused: Not on file  . Physically Abused: Not on file  . Sexually Abused: Not on file     Current Outpatient Medications:  .  albuterol (PROAIR HFA) 108 (90 Base) MCG/ACT inhaler, Inhale 2 puffs into the lungs every 6 (six) hours as needed for wheezing or shortness of breath. , Disp: , Rfl:  .  amLODipine (NORVASC) 5 MG tablet, Take 1 tablet (5 mg total) by mouth daily., Disp: 30 tablet, Rfl: 3 .  aspirin EC 325 MG tablet, Take 325 mg by  mouth daily., Disp: , Rfl:  .  budesonide-formoterol (SYMBICORT) 160-4.5 MCG/ACT inhaler, Take 1 puffs first thing in am and then another  1 puffs about 12 hours later. (Patient taking differently: Inhale 1 puff into the lungs 2 (two) times daily as needed (for respiratory flares). ), Disp: , Rfl: 0 .  docusate sodium (COLACE) 100 MG capsule, Take 1 capsule (100 mg total) by mouth 2 (two) times daily., Disp: 60 capsule, Rfl: 3 .  dutasteride (AVODART) 0.5 MG capsule, Take 1 capsule (0.5 mg total) by  mouth daily., Disp: 30 capsule, Rfl: 2 .  famotidine (PEPCID) 40 MG tablet, Take 40 mg by mouth daily as needed for heartburn or indigestion. , Disp: , Rfl:  .  gabapentin (NEURONTIN) 300 MG capsule, Take 1 capsule (300 mg total) by mouth 2 (two) times daily., Disp: , Rfl:  .  hydrocortisone (ANUSOL-HC) 2.5 % rectal cream, Place 1 application rectally 3 (three) times daily. (Patient taking differently: Place 1 application rectally 3 (three) times daily as needed for hemorrhoids or anal itching. ), Disp: 28 g, Rfl: 2 .  lidocaine (XYLOCAINE) 2 % jelly, Apply 1 application topically as needed. (Patient taking differently: Apply 1 application topically as needed (for aching or pain). ), Disp: 10 mL, Rfl: 0 .  lisinopril (ZESTRIL) 20 MG tablet, Take 40 mg by mouth daily. , Disp: , Rfl:  .  naproxen sodium (ALEVE) 220 MG tablet, Take 220-440 mg by mouth 2 (two) times daily as needed (for pain or headaches)., Disp: , Rfl:  .  pantoprazole (PROTONIX) 40 MG tablet, Take 40 mg by mouth daily., Disp: , Rfl:  .  potassium chloride (K-DUR) 10 MEQ tablet, Take 1 tablet (10 mEq total) by mouth daily., Disp: 30 tablet, Rfl: 3 .  rosuvastatin (CRESTOR) 40 MG tablet, Take 1 tablet (40 mg total) by mouth daily., Disp: 90 tablet, Rfl: 3 .  sildenafil (VIAGRA) 100 MG tablet, Take 1 tablet (100 mg total) by mouth daily as needed for erectile dysfunction., Disp: 10 tablet, Rfl: 2 .  tamsulosin (FLOMAX) 0.4 MG CAPS capsule, Take  1 capsule (0.4 mg total) by mouth daily after supper., Disp: 90 capsule, Rfl: 1  Current Facility-Administered Medications:  .  sodium chloride flush (NS) 0.9 % injection 3 mL, 3 mL, Intravenous, Q12H, Lorretta Harp, MD  EXAMTonette Bihari per patient if applicable: Ht 5\' 5"  (1.651 m)   BMI 22.10 kg/m   GENERAL: alert, oriented,in no acute distress  PSYCH/NEURO: pleasant and cooperative, no obvious depression or anxiety, speech and thought processing grossly intact  ASSESSMENT AND PLAN:  Discussed the following assessment and plan:  Rectal bleeding Likely related to hemorrhoids however given family history of  Colon cancer and his weight loss will refer to GI for evaluation of colonoscopy.  Discussed if rectal bleeding worsens, he feels fatigued or had dizziness/lightheaded feeling he should go to ER.  He expresses understanding.    Non face to face time spent with patient: 22 minutes.     I discussed the assessment and treatment plan with the patient. The patient was provided an opportunity to ask questions and all were answered. The patient agreed with the plan and demonstrated an understanding of the instructions.   The patient was advised to call back or seek an in-person evaluation if the symptoms worsen or if the condition fails to improve as anticipated.    [name], DO

## 2019-02-07 NOTE — Assessment & Plan Note (Signed)
Likely related to hemorrhoids however given family history of  Colon cancer and his weight loss will refer to GI for evaluation of colonoscopy.  Discussed if rectal bleeding worsens, he feels fatigued or had dizziness/lightheaded feeling he should go to ER.  He expresses understanding.

## 2019-02-08 ENCOUNTER — Telehealth: Payer: Self-pay

## 2019-02-08 NOTE — Telephone Encounter (Signed)
-----   Message from [place], DO sent at 02/06/2019  9:44 PM EST ----- CT scan does not show stone or other reason for his flank/back pain.

## 2019-02-09 NOTE — Telephone Encounter (Signed)
Created in error

## 2019-02-17 ENCOUNTER — Telehealth: Payer: Self-pay | Admitting: Cardiovascular Disease

## 2019-02-17 NOTE — Telephone Encounter (Signed)
New Message   Patient states that he was supposed to give Dr. Gwenlyn Found a call back and didn't have chance to. Patient is wanting a call back from Dr. Gwenlyn Found when he gets a chance. Best call back number [phone].

## 2019-02-17 NOTE — Telephone Encounter (Signed)
Pt called and had questions regarding his procedure on 1/7. Explained procedure and instructions, verbalized understanding. Aware of the covid test on 1/4. Also asked questions regarding his colonoscopy on 1/13. Advised to called his gastrologist.

## 2019-02-21 ENCOUNTER — Other Ambulatory Visit (HOSPITAL_COMMUNITY)
Admission: RE | Admit: 2019-02-21 | Discharge: 2019-02-21 | Disposition: A | Discharge status: Home / Self Care | Payer: Medicare (Managed Care) | Source: Ambulatory Visit | Attending: Cardiovascular Disease | Admitting: Cardiovascular Disease

## 2019-02-21 ENCOUNTER — Telehealth: Payer: Self-pay | Admitting: Cardiovascular Disease

## 2019-02-21 DIAGNOSIS — Z01812 Encounter for preprocedural laboratory examination: Secondary | ICD-10-CM | POA: Insufficient documentation

## 2019-02-21 DIAGNOSIS — Z20822 Contact with and (suspected) exposure to covid-19: Secondary | ICD-10-CM | POA: Insufficient documentation

## 2019-02-21 NOTE — Telephone Encounter (Signed)
Returned the call to patient. He was calling to get further instructions and details about the procedure. All questions have been answered and nothing further needed at this time.

## 2019-02-21 NOTE — Telephone Encounter (Signed)
  Patient would like some clarification and has some questions regarding what exactly will be done during his procedure on 02/24/19 with Dr Gwenlyn Found.

## 2019-02-22 ENCOUNTER — Telehealth: Payer: Self-pay | Admitting: Cardiovascular Disease

## 2019-02-22 LAB — NOVEL CORONAVIRUS, NAA (HOSP ORDER, SEND-OUT TO REF LAB; TAT 18-24 HRS): SARS-CoV-2, NAA: NOT DETECTED

## 2019-02-22 NOTE — Telephone Encounter (Signed)
I spoke with pt. He went to admitting department yesterday. He paid $150 deposit for procedure. He reports he will still owe additional amount for procedure and has billing questions.  Will send message to billing department.  Pt then went to [facility] and had covid testing done. He went directly home after this and is aware to quarantine until time of procedure.  I told him he did not need lab work done prior to procedure. Pt located procedure instructions and I verbally went over these with him. He is aware that someone must drive him home and stay with him for 24 hours after procedure.  He thinks his niece can do this.  He will call us tomorrow if he is unable to make arrangements.

## 2019-02-22 NOTE — Telephone Encounter (Signed)
Patient calling stating he had his covid test yesterday before, but was not able to get his blood work done for his procedure tomorrow 1/6. He says he went to the wrong place for lab work at first and then was sent to right place, but they charged him $356 to swab his nose. He says he paid $150 and was told his insurance did not cover it. He does not know what that was for and states he still needs to get lab work done. He would like a call back about what to do.

## 2019-02-23 ENCOUNTER — Telehealth: Payer: Self-pay | Admitting: *Deleted

## 2019-02-23 NOTE — Telephone Encounter (Signed)
Pt contacted pre-catheterization scheduled at [facility] for: Thursday February 24, 2019 7:30 AM Verified arrival time and place: [facility]) at: 5:30 AM   No solid food after midnight prior to cath, clear liquids until 5 AM day of procedure. Contrast allergy: no  Hold: Viagra-until post procedure.  AM meds can be  taken pre-cath with sip of water including: ASA 81 mg-pt states he can take and tolerate coated ASA   Confirmed patient has responsible adult to drive home post procedure and observe 24 hours after arriving home: yes  Currently, due to Covid-19 pandemic, only one support person will be allowed with patient. Must be the same support person for that patient's entire stay, will be screened and required to wear a mask. They will be asked to wait in the waiting room for the duration of the patient's stay.  Patients are required to wear a mask when they enter the hospital.      COVID-19 Pre-Screening Questions:  . In the past 7 to 10 days have you had a cough,  shortness of breath, headache, congestion, fever (100 or greater) body aches, chills, sore throat, or sudden loss of taste or sense of smell? no . Have you been around anyone with known Covid 19? no . Have you been around anyone who is awaiting Covid 19 test results in the past 7 to 10 days? no . Have you been around anyone who has been exposed to Covid 19, or has mentioned symptoms of Covid 19 within the past 7 to 10 days? no   I reviewed procedure/mask/visitor instructions, Covid-19 screening questions with patient, he verbalized understanding, thanked me for call.

## 2019-02-24 ENCOUNTER — Other Ambulatory Visit: Payer: Self-pay

## 2019-02-24 ENCOUNTER — Emergency Department (HOSPITAL_COMMUNITY)
Admission: EM | Admit: 2019-02-24 | Discharge: 2019-02-25 | Discharge status: Against Medical Advice | Payer: Medicare (Managed Care) | Source: Home / Self Care

## 2019-02-24 ENCOUNTER — Ambulatory Visit (HOSPITAL_COMMUNITY)
Admission: RE | Admit: 2019-02-24 | Discharge: 2019-02-24 | Disposition: A | Discharge status: Home / Self Care | Payer: Medicare (Managed Care) | Attending: Cardiovascular Disease | Admitting: Cardiovascular Disease

## 2019-02-24 ENCOUNTER — Encounter (HOSPITAL_COMMUNITY)
Admission: RE | Disposition: A | Discharge status: Home / Self Care | Payer: Self-pay | Source: Home / Self Care | Attending: Cardiovascular Disease

## 2019-02-24 DIAGNOSIS — I10 Essential (primary) hypertension: Secondary | ICD-10-CM | POA: Insufficient documentation

## 2019-02-24 DIAGNOSIS — Z951 Presence of aortocoronary bypass graft: Secondary | ICD-10-CM | POA: Insufficient documentation

## 2019-02-24 DIAGNOSIS — Z886 Allergy status to analgesic agent status: Secondary | ICD-10-CM | POA: Diagnosis not present

## 2019-02-24 DIAGNOSIS — Z7951 Long term (current) use of inhaled steroids: Secondary | ICD-10-CM | POA: Insufficient documentation

## 2019-02-24 DIAGNOSIS — I739 Peripheral vascular disease, unspecified: Secondary | ICD-10-CM | POA: Diagnosis present

## 2019-02-24 DIAGNOSIS — Z79899 Other long term (current) drug therapy: Secondary | ICD-10-CM | POA: Insufficient documentation

## 2019-02-24 DIAGNOSIS — I2510 Atherosclerotic heart disease of native coronary artery without angina pectoris: Secondary | ICD-10-CM | POA: Insufficient documentation

## 2019-02-24 DIAGNOSIS — Z7982 Long term (current) use of aspirin: Secondary | ICD-10-CM | POA: Insufficient documentation

## 2019-02-24 DIAGNOSIS — I70212 Atherosclerosis of native arteries of extremities with intermittent claudication, left leg: Secondary | ICD-10-CM

## 2019-02-24 DIAGNOSIS — F17210 Nicotine dependence, cigarettes, uncomplicated: Secondary | ICD-10-CM | POA: Insufficient documentation

## 2019-02-24 DIAGNOSIS — E785 Hyperlipidemia, unspecified: Secondary | ICD-10-CM | POA: Diagnosis not present

## 2019-02-24 HISTORY — PX: ABDOMINAL AORTOGRAM W/LOWER EXTREMITY: CATH118223

## 2019-02-24 HISTORY — PX: PERIPHERAL VASCULAR INTERVENTION: CATH118257

## 2019-02-24 LAB — POCT ACTIVATED CLOTTING TIME
Activated Clotting Time: 263 seconds
Activated Clotting Time: 312 seconds

## 2019-02-24 SURGERY — ABDOMINAL AORTOGRAM W/LOWER EXTREMITY
Anesthesia: LOCAL

## 2019-02-24 MED ORDER — LABETALOL HCL 5 MG/ML IV SOLN: 10.0000 mg | INTRAVENOUS | Status: DC | PRN

## 2019-02-24 MED ORDER — MORPHINE SULFATE (PF) 2 MG/ML IV SOLN: 2.0000 mg | INTRAVENOUS | Status: DC | PRN

## 2019-02-24 MED ORDER — SODIUM CHLORIDE 0.9 % WEIGHT BASED INFUSION
3.0000 mL/kg/h | INTRAVENOUS | Status: AC
  Administered 2019-02-24: 3 mL/kg/h via INTRAVENOUS

## 2019-02-24 MED ORDER — ONDANSETRON HCL 4 MG/2ML IJ SOLN: 4.0000 mg | Freq: Four times a day (QID) | INTRAMUSCULAR | Status: DC | PRN

## 2019-02-24 MED ORDER — SODIUM CHLORIDE 0.9% FLUSH: 3.0000 mL | INTRAVENOUS | Status: DC | PRN

## 2019-02-24 MED ORDER — FENTANYL CITRATE (PF) 100 MCG/2ML IJ SOLN
INTRAMUSCULAR | Status: DC | PRN
  Administered 2019-02-24: 25 ug via INTRAVENOUS
  Administered 2019-02-24: 50 ug via INTRAVENOUS

## 2019-02-24 MED ORDER — HEPARIN (PORCINE) IN NACL 1000-0.9 UT/500ML-% IV SOLN
INTRAVENOUS | Status: DC | PRN
  Administered 2019-02-24 (×2): 500 mL

## 2019-02-24 MED ORDER — SODIUM CHLORIDE 0.9 % IV SOLN: 250.0000 mL | INTRAVENOUS | Status: DC | PRN

## 2019-02-24 MED ORDER — ACETAMINOPHEN 325 MG PO TABS: 650.0000 mg | ORAL_TABLET | ORAL | Status: DC | PRN

## 2019-02-24 MED ORDER — HEPARIN (PORCINE) IN NACL 1000-0.9 UT/500ML-% IV SOLN
INTRAVENOUS | Status: AC
  Filled 2019-02-24: qty 1000

## 2019-02-24 MED ORDER — IODIXANOL 320 MG/ML IV SOLN
INTRAVENOUS | Status: DC | PRN
  Administered 2019-02-24: 200 mL

## 2019-02-24 MED ORDER — CLOPIDOGREL BISULFATE 300 MG PO TABS
ORAL_TABLET | ORAL | Status: AC
  Filled 2019-02-24: qty 1

## 2019-02-24 MED ORDER — MIDAZOLAM HCL 2 MG/2ML IJ SOLN
INTRAMUSCULAR | Status: AC
  Filled 2019-02-24: qty 2

## 2019-02-24 MED ORDER — NITROGLYCERIN 1 MG/10 ML FOR IR/CATH LAB
INTRA_ARTERIAL | Status: DC | PRN
  Administered 2019-02-24 (×2): 200 ug via INTRA_ARTERIAL

## 2019-02-24 MED ORDER — SODIUM CHLORIDE 0.9 % WEIGHT BASED INFUSION: 1.0000 mL/kg/h | INTRAVENOUS | Status: DC

## 2019-02-24 MED ORDER — HEPARIN SODIUM (PORCINE) 1000 UNIT/ML IJ SOLN
INTRAMUSCULAR | Status: AC
  Filled 2019-02-24: qty 1

## 2019-02-24 MED ORDER — SODIUM CHLORIDE 0.9 % IV SOLN: INTRAVENOUS | Status: DC

## 2019-02-24 MED ORDER — ASPIRIN 81 MG PO CHEW
81.0000 mg | CHEWABLE_TABLET | ORAL | Status: AC
  Administered 2019-02-24: 07:00:00 81 mg via ORAL
  Filled 2019-02-24: qty 1

## 2019-02-24 MED ORDER — CLOPIDOGREL BISULFATE 300 MG PO TABS
ORAL_TABLET | ORAL | Status: DC | PRN
  Administered 2019-02-24: 300 mg via ORAL

## 2019-02-24 MED ORDER — ATORVASTATIN CALCIUM 80 MG PO TABS
80.0000 mg | ORAL_TABLET | Freq: Every day | ORAL | Status: DC
  Filled 2019-02-24: qty 1

## 2019-02-24 MED ORDER — HEPARIN SODIUM (PORCINE) 1000 UNIT/ML IJ SOLN
INTRAMUSCULAR | Status: DC | PRN
  Administered 2019-02-24: 6000 [IU] via INTRAVENOUS
  Administered 2019-02-24: 3000 [IU] via INTRAVENOUS

## 2019-02-24 MED ORDER — SODIUM CHLORIDE 0.9% FLUSH: 3.0000 mL | Freq: Two times a day (BID) | INTRAVENOUS | Status: DC

## 2019-02-24 MED ORDER — CLOPIDOGREL BISULFATE 75 MG PO TABS: 75.0000 mg | ORAL_TABLET | Freq: Every day | ORAL | 1 refills | Status: AC

## 2019-02-24 MED ORDER — HYDRALAZINE HCL 20 MG/ML IJ SOLN: 5.0000 mg | INTRAMUSCULAR | Status: DC | PRN

## 2019-02-24 MED ORDER — MIDAZOLAM HCL 2 MG/2ML IJ SOLN
INTRAMUSCULAR | Status: DC | PRN
  Administered 2019-02-24: 1 mg via INTRAVENOUS

## 2019-02-24 MED ORDER — ASPIRIN EC 81 MG PO TBEC: 81.0000 mg | DELAYED_RELEASE_TABLET | Freq: Every day | ORAL | Status: DC

## 2019-02-24 MED ORDER — LIDOCAINE HCL (PF) 1 % IJ SOLN
INTRAMUSCULAR | Status: AC
  Filled 2019-02-24: qty 30

## 2019-02-24 MED ORDER — CLOPIDOGREL BISULFATE 75 MG PO TABS: 75.0000 mg | ORAL_TABLET | Freq: Every day | ORAL | Status: DC

## 2019-02-24 MED ORDER — LIDOCAINE HCL (PF) 1 % IJ SOLN
INTRAMUSCULAR | Status: DC | PRN
  Administered 2019-02-24: 30 mL via INTRADERMAL

## 2019-02-24 MED ORDER — NITROGLYCERIN 1 MG/10 ML FOR IR/CATH LAB
INTRA_ARTERIAL | Status: AC
  Filled 2019-02-24: qty 10

## 2019-02-24 MED ORDER — FENTANYL CITRATE (PF) 100 MCG/2ML IJ SOLN
INTRAMUSCULAR | Status: AC
  Filled 2019-02-24: qty 2

## 2019-02-24 SURGICAL SUPPLY — 24 items
BALLN ADMIRAL INPACT 5X200 (BALLOONS) ×3
BALLOON ADMIRAL INPACT 5X200 (BALLOONS) ×2 IMPLANT
CATH ANGIO 5F PIGTAIL 65CM (CATHETERS) ×3 IMPLANT
CATH CROSS OVER TEMPO 5F (CATHETERS) ×3 IMPLANT
CATH HAWKONE LS STANDARD TIP (CATHETERS) ×3
CATH HAWKONE LS STD TIP (CATHETERS) ×2 IMPLANT
CATH STRAIGHT 5FR 65CM (CATHETERS) ×3 IMPLANT
CLOSURE MYNX CONTROL 6F/7F (Vascular Products) ×3 IMPLANT
DEVICE SPIDERFX EMB PROT 6MM (WIRE) ×3 IMPLANT
KIT ENCORE 26 ADVANTAGE (KITS) ×3 IMPLANT
KIT PV (KITS) ×3 IMPLANT
SHEATH FLEXOR ANSEL 1 7F 45CM (SHEATH) ×3 IMPLANT
SHEATH PINNACLE 5F 10CM (SHEATH) ×3 IMPLANT
SHEATH PINNACLE 7F 10CM (SHEATH) ×3 IMPLANT
SHEATH PROBE COVER 6X72 (BAG) ×3 IMPLANT
STOPCOCK MORSE 400PSI 3WAY (MISCELLANEOUS) ×3 IMPLANT
SYR MEDRAD MARK 7 150ML (SYRINGE) ×3 IMPLANT
TAPE VIPERTRACK RADIOPAQ (MISCELLANEOUS) ×2 IMPLANT
TAPE VIPERTRACK RADIOPAQUE (MISCELLANEOUS) ×1
TRANSDUCER W/STOPCOCK (MISCELLANEOUS) ×3 IMPLANT
TRAY PV CATH (CUSTOM PROCEDURE TRAY) ×3 IMPLANT
TUBING CIL FLEX 10 FLL-RA (TUBING) ×3 IMPLANT
WIRE HITORQ VERSACORE ST 145CM (WIRE) ×3 IMPLANT
WIRE SPARTACORE .014X300CM (WIRE) ×3 IMPLANT

## 2019-02-24 NOTE — ED Notes (Signed)
Pt seen on the phone, cursing and complaining due to wait time.

## 2019-02-24 NOTE — ED Triage Notes (Signed)
Per [facility] pt dc'd from here approx 2 hours after having stent placed in leg through groin. Pt called 911 after having blood loss from site and pain on ems arrival site had stopped bleeding.

## 2019-02-24 NOTE — Interval H&P Note (Signed)
History and Physical Interval Note:  02/24/2019 7:36 AM  Shane Carrillo  has presented today for surgery, with the diagnosis of claudication.  The various methods of treatment have been discussed with the patient and family. After consideration of risks, benefits and other options for treatment, the patient has consented to  Procedure(s): ABDOMINAL AORTOGRAM W/LOWER EXTREMITY (N/A) as a surgical intervention.  The patient's history has been reviewed, patient examined, no change in status, stable for surgery.  I have reviewed the patient's chart and labs.  Questions were answered to the patient's satisfaction.     Quay Burow

## 2019-02-24 NOTE — ED Notes (Signed)
Pt states he is leaving and will call his surgeon in AM.

## 2019-02-24 NOTE — Discharge Instructions (Signed)
   Discharge Instructions  Lower Extremity Angiogram; Angioplasty/Stenting  Please refer to the following instructions for your post-procedure care. Your surgeon or physician assistant will discuss any changes with you.  Activity  Avoid lifting more than 8 pounds (1 gallons of milk) for 72 hours (3 days) after your procedure. You may walk as much as you can tolerate. It's OK to drive after 72 hours.  Bathing/Showering  You may shower the day after your procedure. If you have a bandage, you may remove it at 24- 48 hours. Clean your incision site with mild soap and water. Pat the area dry with a clean towel.  Diet  Resume your pre-procedure diet. There are no special food restrictions following this procedure. All patients with peripheral vascular disease should follow a low fat/low cholesterol diet. In order to heal from your surgery, it is CRITICAL to get adequate nutrition. Your body requires vitamins, minerals, and protein. Vegetables are the best source of vitamins and minerals. Vegetables also provide the perfect balance of protein. Processed food has little nutritional value, so try to avoid this.  Medications  Resume taking all of your medications unless your doctor tells you not to. If your incision is causing pain, you may take over-the-counter pain relievers such as acetaminophen (Tylenol)  Follow Up  Follow up will be arranged at the time of your procedure. You may have an office visit scheduled or may be scheduled for surgery. Ask your surgeon if you have any questions.  Please call us immediately for any of the following conditions: .Severe or worsening pain your legs or feet at rest or with walking. .Increased pain, redness, drainage at your groin puncture site. .Fever of 101 degrees or higher. .If you have any mild or slow bleeding from your puncture site: lie down, apply firm constant pressure over the area with a piece of gauze or a clean wash cloth for 30 minutes- no  peeking!, call 911 right away if you are still bleeding after 30 minutes, or if the bleeding is heavy and unmanageable.  Reduce your risk factors of vascular disease:  Stop smoking. If you would like help call [facility] at 1-800-QUIT-NOW 3851321044) or [place] at [phone]. Manage your cholesterol Maintain a desired weight Control your diabetes Keep your blood pressure down  If you have any questions, please call Dr. Kennon Holter office.

## 2019-02-25 ENCOUNTER — Telehealth: Payer: Self-pay

## 2019-02-25 ENCOUNTER — Encounter: Payer: Self-pay | Admitting: Cardiovascular Disease

## 2019-02-25 ENCOUNTER — Ambulatory Visit (INDEPENDENT_AMBULATORY_CARE_PROVIDER_SITE_OTHER): Payer: Medicare (Managed Care) | Admitting: Cardiovascular Disease

## 2019-02-25 ENCOUNTER — Other Ambulatory Visit (HOSPITAL_COMMUNITY): Payer: Self-pay | Admitting: Cardiovascular Disease

## 2019-02-25 ENCOUNTER — Ambulatory Visit (HOSPITAL_COMMUNITY)
Admission: EM | Admit: 2019-02-25 | Discharge: 2019-02-25 | Discharge status: Against Medical Advice | Payer: Medicare (Managed Care) | Attending: Emergency Medicine | Admitting: Emergency Medicine

## 2019-02-25 VITALS — BP 157/91 | HR 72 | Temp 97.3°F | Ht 64.0 in | Wt 134.0 lb

## 2019-02-25 DIAGNOSIS — I739 Peripheral vascular disease, unspecified: Secondary | ICD-10-CM

## 2019-02-25 DIAGNOSIS — Z5321 Procedure and treatment not carried out due to patient leaving prior to being seen by health care provider: Secondary | ICD-10-CM | POA: Diagnosis not present

## 2019-02-25 DIAGNOSIS — Z9889 Other specified postprocedural states: Secondary | ICD-10-CM

## 2019-02-25 DIAGNOSIS — R58 Hemorrhage, not elsewhere classified: Secondary | ICD-10-CM

## 2019-02-25 DIAGNOSIS — G8918 Other acute postprocedural pain: Secondary | ICD-10-CM | POA: Diagnosis present

## 2019-02-25 NOTE — Patient Instructions (Addendum)
Medication Instructions:  Your physician recommends that you continue on your current medications as directed. Please refer to the Current Medication list given to you today.  If you need a refill on your cardiac medications before your next appointment, please call your pharmacy.   Lab work: CBC If you have labs (blood work) drawn today and your tests are completely normal, you will receive your results only by: [place] (if you have MyChart) OR A paper copy in the mail If you have any lab test that is abnormal or we need to change your treatment, we will call you to review the results.  Testing/Procedures: Your physician has requested that you have a lower extremity arterial exercise duplex today. During this test, exercise and ultrasound are used to evaluate arterial blood flow in the legs. Allow one hour for this exam. There are no restrictions or special instructions.   Follow-Up: At [facility], you and your health needs are our priority.  As part of our continuing mission to provide you with exceptional heart care, we have created designated Provider Care Teams.  These Care Teams include your primary Cardiologist (physician) and Advanced Practice Providers (APPs -  Physician Assistants and Nurse Practitioners) who all work together to provide you with the care you need, when you need it. You may see Dr Gwenlyn Found or one of the following Advanced Practice Providers on your designated Care Team:    Kerin Ransom, PA-C  [place], [place]  Coletta Memos, [place]  Your physician wants you to follow-up in: 1 week

## 2019-02-25 NOTE — Progress Notes (Signed)
Mr. Southwood returns today after having undergone left SFA and popliteal intervention yesterday as my first case.  His right common femoral artery was accessed using ultrasound.  He was closed with Mynx closure device in [place] the same day intervention.  Apparently he bled at night and was brought back to the ER by EMS.  He did not like to be seen in left to go home and is here today.  He had some small amount of coagulated blood under the dressing which I took off and cleaned.  There is no hematoma.  I am going to get a Doppler on his left lower extremity today.  I do not see a need to rehospitalize him at this time.  We placed a pressure bandage on his right groin.  I am also going to get a CBC.  Lorretta Harp, M.D., [employer] [place], [facility], [place] [address] [place], [place]  [address]  [phone] 02/25/2019 12:44 PM

## 2019-02-25 NOTE — Telephone Encounter (Signed)
Pt called office and stated that after he got home from his procedure yesterday with Dr Gwenlyn Found stated he got home and called EMS because he was bleeding a lot and the site was painful. Stated he went to ED and they kept him for 8 hours and did nothing. He went back home and stated he would rather call Dr Gwenlyn Found and get seen. Stated that his leg and foot were cold and numb and the bleeding got worse. Asked Dr Gwenlyn Found if pt could come in, Dr Gwenlyn Found stated he would see him. Pt stated that he has no one to drive him and he would have to drive with his left foot. Advised pt to find someone to drive him and to not do anything to put himself in danger. Advised pt if he could not find anyone to drive him to call [phone]. Verbalized understanding.

## 2019-03-02 ENCOUNTER — Ambulatory Visit: Payer: Medicare HMO | Admitting: Physician Assistant

## 2019-03-04 ENCOUNTER — Inpatient Hospital Stay (HOSPITAL_COMMUNITY): Admission: RE | Admit: 2019-03-04 | Payer: Medicare HMO | Source: Ambulatory Visit

## 2019-03-11 ENCOUNTER — Other Ambulatory Visit: Payer: Self-pay

## 2019-03-11 ENCOUNTER — Encounter: Payer: Self-pay | Admitting: Cardiovascular Disease

## 2019-03-11 ENCOUNTER — Ambulatory Visit (INDEPENDENT_AMBULATORY_CARE_PROVIDER_SITE_OTHER): Payer: Medicare (Managed Care) | Admitting: Cardiovascular Disease

## 2019-03-11 DIAGNOSIS — I739 Peripheral vascular disease, unspecified: Secondary | ICD-10-CM

## 2019-03-11 NOTE — Patient Instructions (Signed)
Medication Instructions:  Your physician recommends that you continue on your current medications as directed. Please refer to the Current Medication list given to you today.  If you need a refill on your cardiac medications before your next appointment, please call your pharmacy.   Lab work: NONE  Testing/Procedures: NONE  Follow-Up: At [facility], you and your health needs are our priority.  As part of our continuing mission to provide you with exceptional heart care, we have created designated Provider Care Teams.  These Care Teams include your primary Cardiologist (physician) and Advanced Practice Providers (APPs -  Physician Assistants and Nurse Practitioners) who all work together to provide you with the care you need, when you need it. You may see Dr. Gwenlyn Found or one of the following Advanced Practice Providers on your designated Care Team:    Kerin Ransom, PA-C  [place], [place]  Coletta Memos, [place]  Your physician wants you to follow-up in: 3 months to discuss angiography

## 2019-03-11 NOTE — Assessment & Plan Note (Signed)
Shane Carrillo returns today for follow-up of his recent endovascular procedure which I performed 02/24/2019.  He had 70% segmental mid left SFA with high-grade 95% popliteal artery stenosis and two-vessel runoff.  I performed directional atherectomy followed by drug-coated balloon angioplasty with excellent result.  Claudication has resolved and the leg and his Dopplers have improved.  He did have a small groin bleed which resolved and his groin is stable with a small resolving hematoma.  He has residual disease in his right SFA and wishes to have this percutaneously addressed.  Because of the pandemic we will delay this for 3 months.

## 2019-03-11 NOTE — Progress Notes (Signed)
03/11/2019 Shane Carrillo   July 20, 1945  [id number]  Primary Physician [name], DO Primary Cardiologist: Lorretta Harp MD [name], [place]  HPI:  Shane Carrillo is a 74 y.o.  thin appearing married African-American male father of 49 children, grandfather of 54 grandchildren referred by Dr. Davina Poke for peripheral vascular valuation because of lifestyle limiting claudication.  I last saw him in the office 01/26/2019. He is retired from working in the [employer] in the [employer] in [place].  His risk factors include 60-pack-year tobacco abuse currently smoking a pack a day, treated hypertension hyperlipidemia.  He is never had a heart attack or stroke.  He has had bypass surgery in 2008.  He recently saw Dr. Davina Poke for preoperative clearance before hemorrhoid surgery by Dr. Johney Maine and had a low risk Myoview normal 2D echocardiogram.  He has had bilateral calf claudication for 6 months which is symmetric and lifestyle limiting.  Recent Dopplers performed 01/19/2019 revealed a right ABI of 0.97 and a left ABI of 0.92 with bilateral popliteal stenoses.  I performed peripheral angiography on him 02/24/2019 revealing bilateral SFA and popliteal stenoses.  He had moderate iliac disease but this did not appear to be physiologically significant on interrogation intraoperatively.  I performed directional atherectomy followed by drug-coated balloon angioplasty of the left SFA and popliteal artery.  His left ABI did increase from 0.48 up to 0.87.  His claudication on that side has resolved.  He wishes to proceed with staged right SFA and popliteal intervention.    Current Meds  Medication Sig  . albuterol (PROAIR HFA) 108 (90 Base) MCG/ACT inhaler Inhale 2 puffs into the lungs every 6 (six) hours as needed for wheezing or shortness of breath.   [name] amLODipine (NORVASC) 5 MG tablet Take 1 tablet (5 mg total) by mouth daily.  [name] aspirin EC 325 MG tablet Take 325 mg by mouth daily.  .  budesonide-formoterol (SYMBICORT) 160-4.5 MCG/ACT inhaler Take 1 puffs first thing in am and then another  1 puffs about 12 hours later. (Patient taking differently: Inhale 1 puff into the lungs 2 (two) times daily as needed (for respiratory flares). )  . clopidogrel (PLAVIX) 75 MG tablet Take 1 tablet (75 mg total) by mouth daily.  [name] docusate sodium (COLACE) 100 MG capsule Take 1 capsule (100 mg total) by mouth 2 (two) times daily. (Patient taking differently: Take 200 mg by mouth every other day. )  . dutasteride (AVODART) 0.5 MG capsule Take 1 capsule (0.5 mg total) by mouth daily.  . famotidine (PEPCID) 40 MG tablet Take 40 mg by mouth daily as needed for heartburn or indigestion.   . hydrocortisone (ANUSOL-HC) 2.5 % rectal cream Place 1 application rectally 3 (three) times daily. (Patient taking differently: Place 1 application rectally 3 (three) times daily as needed for hemorrhoids or anal itching. )  . lidocaine (XYLOCAINE) 2 % jelly Apply 1 application topically as needed. (Patient taking differently: Apply 1 application topically as needed (for aching or pain). )  . lisinopril (ZESTRIL) 20 MG tablet Take 20 mg by mouth daily as needed (Heart pain).   . naproxen sodium (ALEVE) 220 MG tablet Take 220-440 mg by mouth 2 (two) times daily as needed (for pain or headaches).  . Omega-3 Fatty Acids (FISH OIL) 1000 MG CAPS Take 1 g by mouth daily.  . pantoprazole (PROTONIX) 40 MG tablet Take 40 mg by mouth daily.  . potassium chloride (K-DUR) 10 MEQ tablet  Take 1 tablet (10 mEq total) by mouth daily. (Patient taking differently: Take 10 mEq by mouth See admin instructions. Three times a week)  . rosuvastatin (CRESTOR) 40 MG tablet Take 1 tablet (40 mg total) by mouth daily.  . sildenafil (VIAGRA) 100 MG tablet Take 1 tablet (100 mg total) by mouth daily as needed for erectile dysfunction.  . tamsulosin (FLOMAX) 0.4 MG CAPS capsule Take 1 capsule (0.4 mg total) by mouth daily after supper.      Allergies  Allergen Reactions  . Aspirin Other (See Comments)    GI upset - Pt states that he is able to take the coated form     Social History   Socioeconomic History  . Marital status: Divorced    Spouse name: Not on file  . Number of children: Not on file  . Years of education: Not on file  . Highest education level: Not on file  Occupational History  . Not on file  Tobacco Use  . Smoking status: Current Every Day Smoker    Packs/day: 1.00    Years: 55.00    Pack years: 55.00    Types: Cigarettes  . Smokeless tobacco: Never Used  Substance and Sexual Activity  . Alcohol use: Not Currently  . Drug use: No  . Sexual activity: Not on file  Other Topics Concern  . Not on file  Social History Narrative   Married, 12 grown children.   Orig from [place], [place].   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   Tob: 55 pack-yr hx.  No alc, no drugs.   Social Determinants of Health   Financial Resource Strain:   . Difficulty of Paying Living Expenses: Not on file  Food Insecurity:   . Worried About Charity fundraiser in the Last Year: Not on file  . Ran Out of Food in the Last Year: Not on file  Transportation Needs:   . Lack of Transportation (Medical): Not on file  . Lack of Transportation (Non-Medical): Not on file  Physical Activity:   . Days of Exercise per Week: Not on file  . Minutes of Exercise per Session: Not on file  Stress:   . Feeling of Stress : Not on file  Social Connections:   . Frequency of Communication with Friends and Family: Not on file  . Frequency of Social Gatherings with Friends and Family: Not on file  . Attends Religious Services: Not on file  . Active Member of Clubs or Organizations: Not on file  . Attends Archivist Meetings: Not on file  . Marital Status: Not on file  Intimate Partner Violence:   . Fear of Current or Ex-Partner: Not on file  . Emotionally Abused: Not on file  . Physically Abused:  Not on file  . Sexually Abused: Not on file     Review of Systems: General: negative for chills, fever, night sweats or weight changes.  Cardiovascular: negative for chest pain, dyspnea on exertion, edema, orthopnea, palpitations, paroxysmal nocturnal dyspnea or shortness of breath Dermatological: negative for rash Respiratory: negative for cough or wheezing Urologic: negative for hematuria Abdominal: negative for nausea, vomiting, diarrhea, bright red blood per rectum, melena, or hematemesis Neurologic: negative for visual changes, syncope, or dizziness All other systems reviewed and are otherwise negative except as noted above.    Blood pressure (!) 185/96, pulse 69, temperature (!) 96.9 F (36.1 C), height 5\' 4"  (1.626 m), weight 133 lb 6.4 oz (60.5  kg), SpO2 96 %.  General appearance: alert and no distress Neck: no adenopathy, no carotid bruit, no JVD, supple, symmetrical, trachea midline and thyroid not enlarged, symmetric, no tenderness/mass/nodules Lungs: clear to auscultation bilaterally Heart: regular rate and rhythm, S1, S2 normal, no murmur, click, rub or gallop Extremities: extremities normal, atraumatic, no cyanosis or edema Pulses: 2+ and symmetric Skin: Skin color, texture, turgor normal. No rashes or lesions Neurologic: Alert and oriented X 3, normal strength and tone. Normal symmetric reflexes. Normal coordination and gait  EKG not performed today  ASSESSMENT AND PLAN:   Peripheral arterial disease [facility]) Shane Carrillo returns today for follow-up of his recent endovascular procedure which I performed 02/24/2019.  He had 70% segmental mid left SFA with high-grade 95% popliteal artery stenosis and two-vessel runoff.  I performed directional atherectomy followed by drug-coated balloon angioplasty with excellent result.  Claudication has resolved and the leg and his Dopplers have improved.  He did have a small groin bleed which resolved and his groin is stable with a small  resolving hematoma.  He has residual disease in his right SFA and wishes to have this percutaneously addressed.  Because of the pandemic we will delay this for 3 months.      Lorretta Harp MD FACP,FACC,FAHA, [facility] 03/11/2019 2:52 PM

## 2019-03-18 ENCOUNTER — Telehealth: Payer: Self-pay | Admitting: Family Medicine

## 2019-03-18 NOTE — Telephone Encounter (Signed)
Spoke with the pt, he stated his left leg is painful and right side near kidney area painful as well. Pt has an appt with Dr. Ethelene Hal on 03/21/2019 to talk about this and request sonogram?  Pt is aware Dr. Zigmund Daniel last day is today.   I dont not have any appt to offer him today at our location and pt does not want to go to urgent care.   Ronnie--do I need to do anything more on this one.

## 2019-03-18 NOTE — Telephone Encounter (Signed)
Pt called and said his left side is hurting and wants to talk to Dr Zigmund Daniel nurse, please call back at your earliest convenience at [phone]

## 2019-03-21 ENCOUNTER — Ambulatory Visit: Payer: Medicare (Managed Care) | Admitting: Family Medicine

## 2019-03-23 ENCOUNTER — Telehealth: Payer: Self-pay

## 2019-03-23 NOTE — Telephone Encounter (Signed)
Called pt to set up PV angio with Dr. Gwenlyn Found. [id number]

## 2019-03-29 ENCOUNTER — Telehealth: Payer: Self-pay | Admitting: *Deleted

## 2019-03-29 NOTE — Telephone Encounter (Signed)
A message was left, re: his follow up visit. 

## 2019-04-14 ENCOUNTER — Other Ambulatory Visit: Payer: Self-pay | Admitting: Family Medicine

## 2019-04-14 ENCOUNTER — Other Ambulatory Visit: Payer: Self-pay | Admitting: Internal Medicine

## 2019-04-14 NOTE — Telephone Encounter (Signed)
Left message on voicemail to call office. I need to find out if pt is going to continue care with Dr. Zigmund Daniel or is he going to Transfer Care with another provider here at [facility].

## 2019-05-27 ENCOUNTER — Ambulatory Visit: Payer: Medicare (Managed Care) | Admitting: Cardiovascular Disease

## 2019-06-03 ENCOUNTER — Telehealth (INDEPENDENT_AMBULATORY_CARE_PROVIDER_SITE_OTHER): Payer: Medicare Other | Admitting: Family Medicine

## 2019-06-03 ENCOUNTER — Encounter: Payer: Self-pay | Admitting: Family Medicine

## 2019-06-03 DIAGNOSIS — J309 Allergic rhinitis, unspecified: Secondary | ICD-10-CM | POA: Insufficient documentation

## 2019-06-03 DIAGNOSIS — J301 Allergic rhinitis due to pollen: Secondary | ICD-10-CM

## 2019-06-03 DIAGNOSIS — N529 Male erectile dysfunction, unspecified: Secondary | ICD-10-CM

## 2019-06-03 HISTORY — DX: Allergic rhinitis, unspecified: J30.9

## 2019-06-03 MED ORDER — SILDENAFIL CITRATE 100 MG PO TABS: 100.0000 mg | ORAL_TABLET | Freq: Every day | ORAL | 2 refills | Status: DC | PRN

## 2019-06-03 MED ORDER — FLUTICASONE PROPIONATE 50 MCG/ACT NA SUSP: 2.0000 | Freq: Every day | NASAL | 6 refills | Status: DC

## 2019-06-03 MED ORDER — TAMSULOSIN HCL 0.4 MG PO CAPS: 0.4000 mg | ORAL_CAPSULE | Freq: Every day | ORAL | 1 refills | Status: DC

## 2019-06-03 NOTE — Progress Notes (Signed)
Patient reports that this has been going on 2 weeks and he is not sure if any longer.  Patient reports bilateral ear pain Some productive cough (green) Runny nose.  He has taken some zyrtec only.   He reports that he is having pain in both sides is trying to come back.

## 2019-06-03 NOTE — Assessment & Plan Note (Signed)
Symptoms consistent with allergies.  Continue cetirizine Adding flonase daily. Discussed if mucus changing in color or having sinus pain, fever, pain in teeth to let me know.

## 2019-06-03 NOTE — Progress Notes (Signed)
Shane Carrillo - 74 y.o. male MRN [id number]  Date of birth: 1945/04/30   This visit type was conducted due to national recommendations for restrictions regarding the COVID-19 Pandemic (e.g. social distancing).  This format is felt to be most appropriate for this patient at this time.  All issues noted in this document were discussed and addressed.  No physical exam was performed (except for noted visual exam findings with Video Visits).  I discussed the limitations of evaluation and management by telemedicine and the availability of in person appointments. The patient expressed understanding and agreed to proceed.  I connected with@ on 06/03/19 at  9:10 AM EDT by a video enabled telemedicine application and verified that I am speaking with the correct person using two identifiers.  Patient never connected via link for video visit.  I was able to connect with him via telephone and we completed visit using telecommunications only.     Present at visit: [name], DO [name]   Patient Location: [place] [address]   Provider location:   [facility]  Chief Complaint  Patient presents with  . Nasal Congestion  . Ear Pain    Both ears    HPI  Shane Carrillo is a 74 y.o. male who presents via audio/video conferencing for a telehealth visit today.  He has complaint of nasal congestion, post nasal drainage, mild cough, ear pressure and occasional headaches.  Symptoms started x2 weeks ago. Phlegm and drainage has been clear.  He recently started zyrtec which has helped some.  He thinks he may need an antibiotic.  He denies fever, chills, shortness of breath, wheezing or GI symptoms.    ROS:  A comprehensive ROS was completed and negative except as noted per HPI  Past Medical History:  Diagnosis Date  . Aortic ectasia, abdominal ([place]) 08/2016   On medicare screening u/s: Abdominal aortic atherosclerosis and mild ectasia. Maximal diameter 2.8 cm-->repeat u/s 5  yrs.  [name] BPH with obstruction/lower urinary tract symptoms   . Chronic cough    upper airway cough syndrome/irritable larynx syndrome->Dr. Melvyn Novas.  [name] COPD (chronic obstructive pulmonary disease) ([place])   . Coronary artery disease    Stent.    . DOE (dyspnea on exertion)    spirometry 02/2018 normal, but needs full PFT's per Dr. Melvyn Novas 03/2018.  No bronchodilators indicated as of 03/2018.  [name] GERD (gastroesophageal reflux disease)    Dysphagia  . Hypercholesterolemia    Intol of atorva and simva  . Hypertension   . PAD (peripheral artery disease) (HCC)    Abd aortic athero  . Tobacco dependence   . Unintentional weight loss 2019   +Dysphagia,dec appetite/abd pain->pt was set to get EGD/colonoscopy late 2019 but he never followed up with GI to get this.    Past Surgical History:  Procedure Laterality Date  . ABDOMINAL AORTOGRAM W/LOWER EXTREMITY N/A 02/24/2019   Procedure: ABDOMINAL AORTOGRAM W/LOWER EXTREMITY;  Surgeon: Lorretta Harp, MD;  Location: [facility];  Service: Cardiovascular;  Laterality: N/A;  . CORONARY ANGIOPLASTY WITH STENT PLACEMENT     Dr. Ouida Sills in [place], [place].  [name] FOOT SURGERY    . PERIPHERAL VASCULAR INTERVENTION Left 02/24/2019   Procedure: PERIPHERAL VASCULAR INTERVENTION;  Surgeon: Lorretta Harp, MD;  Location: [facility];  Service: Cardiovascular;  Laterality: Left;  . ROTATOR CUFF REPAIR      Family History  Problem Relation Age of Onset  . Cancer - Cervical  Mother   . Colon cancer Mother   . Colon cancer Father   . Prostate cancer Father   . Diabetes Maternal Grandmother     Social History   Socioeconomic History  . Marital status: Divorced    Spouse name: Not on file  . Number of children: Not on file  . Years of education: Not on file  . Highest education level: Not on file  Occupational History  . Not on file  Tobacco Use  . Smoking status: Current Every Day Smoker    Packs/day: 1.00    Years: 55.00    Pack years: 55.00     Types: Cigarettes  . Smokeless tobacco: Never Used  Substance and Sexual Activity  . Alcohol use: Not Currently  . Drug use: No  . Sexual activity: Not on file  Other Topics Concern  . Not on file  Social History Narrative   Married, 12 grown children.   Orig from [place], [place].   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   Tob: 55 pack-yr hx.  No alc, no drugs.   Social Determinants of Health   Financial Resource Strain:   . Difficulty of Paying Living Expenses:   Food Insecurity:   . Worried About Charity fundraiser in the Last Year:   . Arboriculturist in the Last Year:   Transportation Needs:   . Film/video editor (Medical):   [name] Lack of Transportation (Non-Medical):   Physical Activity:   . Days of Exercise per Week:   . Minutes of Exercise per Session:   Stress:   . Feeling of Stress :   Social Connections:   . Frequency of Communication with Friends and Family:   . Frequency of Social Gatherings with Friends and Family:   . Attends Religious Services:   . Active Member of Clubs or Organizations:   . Attends Archivist Meetings:   [name] Marital Status:   Intimate Partner Violence:   . Fear of Current or Ex-Partner:   . Emotionally Abused:   [name] Physically Abused:   . Sexually Abused:      Current Outpatient Medications:  .  albuterol (PROAIR HFA) 108 (90 Base) MCG/ACT inhaler, Inhale 2 puffs into the lungs every 6 (six) hours as needed for wheezing or shortness of breath. , Disp: , Rfl:  .  amLODipine (NORVASC) 5 MG tablet, Take 1 tablet (5 mg total) by mouth daily., Disp: 30 tablet, Rfl: 3 .  aspirin EC 325 MG tablet, Take 325 mg by mouth daily., Disp: , Rfl:  .  budesonide-formoterol (SYMBICORT) 160-4.5 MCG/ACT inhaler, Take 1 puffs first thing in am and then another  1 puffs about 12 hours later. (Patient taking differently: Inhale 1 puff into the lungs 2 (two) times daily as needed (for respiratory flares). ), Disp: , Rfl:  0 .  clopidogrel (PLAVIX) 75 MG tablet, Take 1 tablet (75 mg total) by mouth daily., Disp: 90 tablet, Rfl: 1 .  docusate sodium (COLACE) 100 MG capsule, Take 1 capsule (100 mg total) by mouth 2 (two) times daily. (Patient taking differently: Take 200 mg by mouth every other day. ), Disp: 60 capsule, Rfl: 3 .  dutasteride (AVODART) 0.5 MG capsule, Take 1 capsule (0.5 mg total) by mouth daily., Disp: 30 capsule, Rfl: 2 .  famotidine (PEPCID) 40 MG tablet, Take 40 mg by mouth daily as needed for heartburn or indigestion. , Disp: , Rfl:  .  hydrocortisone (  ANUSOL-HC) 2.5 % rectal cream, Place 1 application rectally 3 (three) times daily. (Patient taking differently: Place 1 application rectally 3 (three) times daily as needed for hemorrhoids or anal itching. ), Disp: 28 g, Rfl: 2 .  lidocaine (XYLOCAINE) 2 % jelly, Apply topically tid (plz sched with new provider for future fills), Disp: 30 mL, Rfl: 0 .  lisinopril (ZESTRIL) 20 MG tablet, Take 20 mg by mouth daily as needed (Heart pain). , Disp: , Rfl:  .  naproxen sodium (ALEVE) 220 MG tablet, Take 220-440 mg by mouth 2 (two) times daily as needed (for pain or headaches)., Disp: , Rfl:  .  Omega-3 Fatty Acids (FISH OIL) 1000 MG CAPS, Take 1 g by mouth daily., Disp: , Rfl:  .  pantoprazole (PROTONIX) 40 MG tablet, Take 40 mg by mouth daily., Disp: , Rfl:  .  potassium chloride (K-DUR) 10 MEQ tablet, Take 1 tablet (10 mEq total) by mouth daily. (Patient taking differently: Take 10 mEq by mouth See admin instructions. Three times a week), Disp: 30 tablet, Rfl: 3 .  rosuvastatin (CRESTOR) 40 MG tablet, Take 1 tablet (40 mg total) by mouth daily., Disp: 90 tablet, Rfl: 3 .  sildenafil (VIAGRA) 100 MG tablet, Take 1 tablet (100 mg total) by mouth daily as needed for erectile dysfunction., Disp: 10 tablet, Rfl: 2 .  fluticasone (FLONASE) 50 MCG/ACT nasal spray, Place 2 sprays into both nostrils daily., Disp: 16 g, Rfl: 6 .  tamsulosin (FLOMAX) 0.4 MG CAPS  capsule, Take 1 capsule (0.4 mg total) by mouth daily after supper., Disp: 90 capsule, Rfl: 1  EXAM:  VITALS per patient if applicable: Temp (!) [id number] F (36.1 C) (Oral)   Ht 5\' 4"  (1.626 m)   BMI 22.90 kg/m   GENERAL: alert, oriented,in no acute distress  PSYCH/NEURO: pleasant and cooperative, no obvious depression or anxiety, speech and thought processing grossly intact  ASSESSMENT AND PLAN:  Discussed the following assessment and plan:  Allergic rhinitis Symptoms consistent with allergies.  Continue cetirizine Adding flonase daily. Discussed if mucus changing in color or having sinus pain, fever, pain in teeth to let me know.      I discussed the assessment and treatment plan with the patient. The patient was provided an opportunity to ask questions and all were answered. The patient agreed with the plan and demonstrated an understanding of the instructions.   The patient was advised to call back or seek an in-person evaluation if the symptoms worsen or if the condition fails to improve as anticipated.    [name], DO

## 2019-06-07 ENCOUNTER — Ambulatory Visit: Payer: Medicare (Managed Care) | Admitting: Cardiovascular Disease

## 2019-06-16 ENCOUNTER — Observation Stay (HOSPITAL_COMMUNITY): Payer: Medicare Other

## 2019-06-16 ENCOUNTER — Other Ambulatory Visit: Payer: Self-pay

## 2019-06-16 ENCOUNTER — Encounter (HOSPITAL_COMMUNITY)
Admission: EM | Discharge status: Against Medical Advice | Payer: Self-pay | Source: Home / Self Care | Attending: Emergency Medicine

## 2019-06-16 ENCOUNTER — Encounter (HOSPITAL_COMMUNITY): Payer: Self-pay | Admitting: Emergency Medicine

## 2019-06-16 ENCOUNTER — Observation Stay (HOSPITAL_COMMUNITY)
Admission: EM | Admit: 2019-06-16 | Discharge: 2019-06-17 | Discharge status: Against Medical Advice | Payer: Medicare Other | Attending: Internal Medicine | Admitting: Internal Medicine

## 2019-06-16 DIAGNOSIS — Z886 Allergy status to analgesic agent status: Secondary | ICD-10-CM | POA: Insufficient documentation

## 2019-06-16 DIAGNOSIS — E7800 Pure hypercholesterolemia, unspecified: Secondary | ICD-10-CM | POA: Diagnosis not present

## 2019-06-16 DIAGNOSIS — Z955 Presence of coronary angioplasty implant and graft: Secondary | ICD-10-CM | POA: Diagnosis not present

## 2019-06-16 DIAGNOSIS — Z7982 Long term (current) use of aspirin: Secondary | ICD-10-CM | POA: Insufficient documentation

## 2019-06-16 DIAGNOSIS — K219 Gastro-esophageal reflux disease without esophagitis: Secondary | ICD-10-CM | POA: Diagnosis not present

## 2019-06-16 DIAGNOSIS — I2510 Atherosclerotic heart disease of native coronary artery without angina pectoris: Secondary | ICD-10-CM | POA: Diagnosis present

## 2019-06-16 DIAGNOSIS — J449 Chronic obstructive pulmonary disease, unspecified: Secondary | ICD-10-CM | POA: Insufficient documentation

## 2019-06-16 DIAGNOSIS — I25709 Atherosclerosis of coronary artery bypass graft(s), unspecified, with unspecified angina pectoris: Secondary | ICD-10-CM | POA: Diagnosis not present

## 2019-06-16 DIAGNOSIS — R079 Chest pain, unspecified: Secondary | ICD-10-CM | POA: Diagnosis not present

## 2019-06-16 DIAGNOSIS — I739 Peripheral vascular disease, unspecified: Secondary | ICD-10-CM | POA: Insufficient documentation

## 2019-06-16 DIAGNOSIS — Z7951 Long term (current) use of inhaled steroids: Secondary | ICD-10-CM | POA: Diagnosis not present

## 2019-06-16 DIAGNOSIS — Z20822 Contact with and (suspected) exposure to covid-19: Secondary | ICD-10-CM | POA: Diagnosis not present

## 2019-06-16 DIAGNOSIS — I213 ST elevation (STEMI) myocardial infarction of unspecified site: Secondary | ICD-10-CM

## 2019-06-16 DIAGNOSIS — D649 Anemia, unspecified: Secondary | ICD-10-CM

## 2019-06-16 DIAGNOSIS — I10 Essential (primary) hypertension: Secondary | ICD-10-CM | POA: Diagnosis present

## 2019-06-16 DIAGNOSIS — Z79899 Other long term (current) drug therapy: Secondary | ICD-10-CM | POA: Diagnosis not present

## 2019-06-16 DIAGNOSIS — Z951 Presence of aortocoronary bypass graft: Secondary | ICD-10-CM | POA: Insufficient documentation

## 2019-06-16 DIAGNOSIS — R0602 Shortness of breath: Secondary | ICD-10-CM | POA: Diagnosis present

## 2019-06-16 DIAGNOSIS — Z7902 Long term (current) use of antithrombotics/antiplatelets: Secondary | ICD-10-CM | POA: Insufficient documentation

## 2019-06-16 DIAGNOSIS — E785 Hyperlipidemia, unspecified: Secondary | ICD-10-CM | POA: Diagnosis not present

## 2019-06-16 DIAGNOSIS — I200 Unstable angina: Secondary | ICD-10-CM

## 2019-06-16 DIAGNOSIS — Z87891 Personal history of nicotine dependence: Secondary | ICD-10-CM | POA: Diagnosis not present

## 2019-06-16 LAB — CBC WITH DIFFERENTIAL/PLATELET
Abs Immature Granulocytes: 0.02 10*3/uL (ref 0.00–0.07)
Basophils Absolute: 0 10*3/uL (ref 0.0–0.1)
Basophils Relative: 1 %
Eosinophils Absolute: 0.2 10*3/uL (ref 0.0–0.5)
Eosinophils Relative: 2 %
HCT: 39.9 % (ref 39.0–52.0)
Hemoglobin: 12.4 g/dL — ABNORMAL LOW (ref 13.0–17.0)
Immature Granulocytes: 0 %
Lymphocytes Relative: 30 %
Lymphs Abs: 2.4 10*3/uL (ref 0.7–4.0)
MCH: 27.1 pg (ref 26.0–34.0)
MCHC: 31.1 g/dL (ref 30.0–36.0)
MCV: 87.1 fL (ref 80.0–100.0)
Monocytes Absolute: 1 10*3/uL (ref 0.1–1.0)
Monocytes Relative: 12 %
Neutro Abs: 4.5 10*3/uL (ref 1.7–7.7)
Neutrophils Relative %: 55 %
Platelets: 236 10*3/uL (ref 150–400)
RBC: 4.58 MIL/uL (ref 4.22–5.81)
RDW: 15 % (ref 11.5–15.5)
WBC: 8 10*3/uL (ref 4.0–10.5)
nRBC: 0 % (ref 0.0–0.2)

## 2019-06-16 LAB — COMPREHENSIVE METABOLIC PANEL
ALT: 15 U/L (ref 0–44)
AST: 19 U/L (ref 15–41)
Albumin: 3.4 g/dL — ABNORMAL LOW (ref 3.5–5.0)
Alkaline Phosphatase: 45 U/L (ref 38–126)
Anion gap: 12 (ref 5–15)
BUN: 14 mg/dL (ref 8–23)
CO2: 22 mmol/L (ref 22–32)
Calcium: 9.2 mg/dL (ref 8.9–10.3)
Chloride: 108 mmol/L (ref 98–111)
Creatinine, Ser: 1.04 mg/dL (ref 0.61–1.24)
GFR calc Af Amer: >60 mL/min (ref >60)
GFR calc non Af Amer: >60 mL/min (ref >60)
Glucose, Bld: 81 mg/dL (ref 70–99)
Potassium: 3.8 mmol/L (ref 3.5–5.1)
Sodium: 142 mmol/L (ref 135–145)
Total Bilirubin: 0.6 mg/dL (ref 0.3–1.2)
Total Protein: 5.8 g/dL — ABNORMAL LOW (ref 6.5–8.1)

## 2019-06-16 LAB — RESPIRATORY PANEL BY RT PCR (FLU A&B, COVID)
Influenza A by PCR: NEGATIVE
Influenza B by PCR: NEGATIVE
SARS Coronavirus 2 by RT PCR: NEGATIVE

## 2019-06-16 LAB — LIPID PANEL
Cholesterol: 172 mg/dL (ref 0–200)
HDL: 85 mg/dL (ref >40)
LDL Cholesterol: 70 mg/dL (ref 0–99)
Total CHOL/HDL Ratio: 2 RATIO
Triglycerides: 85 mg/dL (ref <150)
VLDL: 17 mg/dL (ref 0–40)

## 2019-06-16 LAB — PROTIME-INR
INR: 1 (ref 0.8–1.2)
Prothrombin Time: 12.8 seconds (ref 11.4–15.2)

## 2019-06-16 LAB — HEMOGLOBIN A1C
Hgb A1c MFr Bld: 5.6 % (ref 4.8–5.6)
Mean Plasma Glucose: 114.02 mg/dL

## 2019-06-16 LAB — APTT: aPTT: 35 seconds (ref 24–36)

## 2019-06-16 LAB — TROPONIN I (HIGH SENSITIVITY)
Troponin I (High Sensitivity): 15 ng/L (ref <18)
Troponin I (High Sensitivity): 17 ng/L (ref <18)

## 2019-06-16 SURGERY — CORONARY/GRAFT ACUTE MI REVASCULARIZATION
Anesthesia: LOCAL

## 2019-06-16 MED ORDER — SODIUM CHLORIDE 0.9 % IV SOLN: INTRAVENOUS | Status: DC

## 2019-06-16 MED ORDER — MORPHINE SULFATE (PF) 2 MG/ML IV SOLN: 2.0000 mg | INTRAVENOUS | Status: DC | PRN

## 2019-06-16 MED ORDER — OMEGA-3-ACID ETHYL ESTERS 1 G PO CAPS
1.0000 g | ORAL_CAPSULE | Freq: Every day | ORAL | Status: DC
  Administered 2019-06-17: 1 g via ORAL
  Filled 2019-06-16: qty 1

## 2019-06-16 MED ORDER — ASPIRIN EC 325 MG PO TBEC
325.0000 mg | DELAYED_RELEASE_TABLET | Freq: Every day | ORAL | Status: DC
  Administered 2019-06-17: 325 mg via ORAL
  Filled 2019-06-16: qty 1

## 2019-06-16 MED ORDER — ACETAMINOPHEN 325 MG PO TABS
650.0000 mg | ORAL_TABLET | ORAL | Status: DC | PRN
  Administered 2019-06-17: 09:00:00 650 mg via ORAL

## 2019-06-16 MED ORDER — FLUTICASONE PROPIONATE 50 MCG/ACT NA SUSP
2.0000 | Freq: Every day | NASAL | Status: DC
  Administered 2019-06-17: 10:00:00 2 via NASAL
  Filled 2019-06-16: qty 16

## 2019-06-16 MED ORDER — ASPIRIN 81 MG PO CHEW
324.0000 mg | CHEWABLE_TABLET | Freq: Once | ORAL | Status: AC
  Administered 2019-06-16: 21:00:00 324 mg via ORAL
  Filled 2019-06-16: qty 4

## 2019-06-16 MED ORDER — FAMOTIDINE 20 MG PO TABS: 40.0000 mg | ORAL_TABLET | Freq: Every day | ORAL | Status: DC | PRN

## 2019-06-16 MED ORDER — POTASSIUM CHLORIDE CRYS ER 10 MEQ PO TBCR
10.0000 meq | EXTENDED_RELEASE_TABLET | ORAL | Status: DC
  Administered 2019-06-17: 13:00:00 10 meq via ORAL
  Filled 2019-06-16 (×2): qty 1

## 2019-06-16 MED ORDER — TAMSULOSIN HCL 0.4 MG PO CAPS: 0.4000 mg | ORAL_CAPSULE | Freq: Every day | ORAL | Status: DC

## 2019-06-16 MED ORDER — ROSUVASTATIN CALCIUM 20 MG PO TABS
40.0000 mg | ORAL_TABLET | Freq: Every day | ORAL | Status: DC
  Administered 2019-06-17: 40 mg via ORAL
  Filled 2019-06-16: qty 2

## 2019-06-16 MED ORDER — NITROGLYCERIN 2 % TD OINT
1.0000 [in_us] | TOPICAL_OINTMENT | Freq: Once | TRANSDERMAL | Status: DC
  Filled 2019-06-16: qty 1

## 2019-06-16 MED ORDER — HEPARIN BOLUS VIA INFUSION
3500.0000 [IU] | Freq: Once | INTRAVENOUS | Status: AC
  Administered 2019-06-16: 22:00:00 3500 [IU] via INTRAVENOUS
  Filled 2019-06-16: qty 3500

## 2019-06-16 MED ORDER — CLOPIDOGREL BISULFATE 75 MG PO TABS
75.0000 mg | ORAL_TABLET | Freq: Every day | ORAL | Status: DC
  Administered 2019-06-17: 10:00:00 75 mg via ORAL
  Filled 2019-06-16: qty 1

## 2019-06-16 MED ORDER — NITROGLYCERIN 0.4 MG SL SUBL
0.4000 mg | SUBLINGUAL_TABLET | SUBLINGUAL | Status: DC | PRN
  Administered 2019-06-16: 21:00:00 0.4 mg via SUBLINGUAL
  Filled 2019-06-16 (×2): qty 1

## 2019-06-16 MED ORDER — FISH OIL 1000 MG PO CAPS: 1000.0000 mg | ORAL_CAPSULE | Freq: Every day | ORAL | Status: DC

## 2019-06-16 MED ORDER — ONDANSETRON HCL 4 MG/2ML IJ SOLN: 4.0000 mg | Freq: Four times a day (QID) | INTRAMUSCULAR | Status: DC | PRN

## 2019-06-16 MED ORDER — HEPARIN (PORCINE) 25000 UT/250ML-% IV SOLN
950.0000 [IU]/h | INTRAVENOUS | Status: DC
  Administered 2019-06-16: 750 [IU]/h via INTRAVENOUS
  Filled 2019-06-16: qty 250

## 2019-06-16 MED ORDER — ALBUTEROL SULFATE HFA 108 (90 BASE) MCG/ACT IN AERS: 2.0000 | INHALATION_SPRAY | Freq: Four times a day (QID) | RESPIRATORY_TRACT | Status: DC | PRN

## 2019-06-16 MED ORDER — MOMETASONE FURO-FORMOTEROL FUM 200-5 MCG/ACT IN AERO
2.0000 | INHALATION_SPRAY | Freq: Two times a day (BID) | RESPIRATORY_TRACT | Status: DC
  Administered 2019-06-17: 09:00:00 2 via RESPIRATORY_TRACT
  Filled 2019-06-16: qty 8.8

## 2019-06-16 MED ORDER — DOCUSATE SODIUM 100 MG PO CAPS
200.0000 mg | ORAL_CAPSULE | ORAL | Status: DC
  Administered 2019-06-17: 200 mg via ORAL
  Filled 2019-06-16: qty 2

## 2019-06-16 NOTE — ED Notes (Addendum)
Pt is requesting blood drawn off his IV. Pt has heparin and saline running in IV. Will check with RN Gretta Cool.

## 2019-06-16 NOTE — H&P (Signed)
History and Physical    Shane Carrillo [id number] DOB: [birth year] DOA: 06/16/2019  PCP: [name], DO  Patient coming from: Home  I have personally briefly reviewed patient's old medical records in [place]  Chief Complaint: CP  HPI: Shane Carrillo is a 74 y.o. male with medical history significant of CAD s/p CABG and stent in 2008, PAD s/p stent 02/2019 with Dr. Gwenlyn Found, HTN.  Last stress test was low risk (showed fixed defect, 49% EF) in Aug 2020.  Last 2D echo showed normal EF in Aug 2020  Last LHC was ~2011 or 2012.  Pt presents to ED with c/o SOB initially.  He states that he had been moving furniture today and he felt out of breath.  This was at about 3:30 PM.  His daughter noted that he was out of breath and got worried and called for an ambulance.   ED Course: EKG shows new TWI / ST depressions in inferior leads.  Lateral leads are chronic.  Also shows LVH.  While in ED developed CP up to 7/10, improved to 5/10 after 1 SL NTG.  Got ASA.  NTG paste being applied, repeat EKG unchanged.  First trop neg.   Review of Systems: As per HPI, otherwise all review of systems negative.  Past Medical History:  Diagnosis Date  . Aortic ectasia, abdominal ([place]) 08/2016   On medicare screening u/s: Abdominal aortic atherosclerosis and mild ectasia. Maximal diameter 2.8 cm-->repeat u/s 5 yrs.  [name] BPH with obstruction/lower urinary tract symptoms   . Chronic cough    upper airway cough syndrome/irritable larynx syndrome->Dr. Melvyn Novas.  [name] COPD (chronic obstructive pulmonary disease) ([place])   . Coronary artery disease    Stent.    . DOE (dyspnea on exertion)    spirometry 02/2018 normal, but needs full PFT's per Dr. Melvyn Novas 03/2018.  No bronchodilators indicated as of 03/2018.  [name] GERD (gastroesophageal reflux disease)    Dysphagia  . Hypercholesterolemia    Intol of atorva and simva  . Hypertension   . PAD (peripheral artery disease) (HCC)    Abd aortic athero  .  Tobacco dependence   . Unintentional weight loss 2019   +Dysphagia,dec appetite/abd pain->pt was set to get EGD/colonoscopy late 2019 but he never followed up with GI to get this.    Past Surgical History:  Procedure Laterality Date  . ABDOMINAL AORTOGRAM W/LOWER EXTREMITY N/A 02/24/2019   Procedure: ABDOMINAL AORTOGRAM W/LOWER EXTREMITY;  Surgeon: Lorretta Harp, MD;  Location: [facility];  Service: Cardiovascular;  Laterality: N/A;  . CORONARY ANGIOPLASTY WITH STENT PLACEMENT     Dr. Ouida Sills in [place], [place].  [name] FOOT SURGERY    . PERIPHERAL VASCULAR INTERVENTION Left 02/24/2019   Procedure: PERIPHERAL VASCULAR INTERVENTION;  Surgeon: Lorretta Harp, MD;  Location: [facility];  Service: Cardiovascular;  Laterality: Left;  . ROTATOR CUFF REPAIR       reports that he has been smoking cigarettes. He has a 55.00 pack-year smoking history. He has never used smokeless tobacco. He reports current alcohol use. He reports that he does not use drugs.  Allergies  Allergen Reactions  . Aspirin Other (See Comments)    GI upset - Pt states that he is able to take the coated form     Family History  Problem Relation Age of Onset  . Cancer - Cervical Mother   . Colon cancer Mother   . Colon cancer Father   . Prostate cancer  Father   . Diabetes Maternal Grandmother      Prior to Admission medications   Medication Sig Start Date End Date Taking? Authorizing Provider  albuterol (PROAIR HFA) 108 (90 Base) MCG/ACT inhaler Inhale 2 puffs into the lungs every 6 (six) hours as needed for wheezing or shortness of breath.    Yes [provider]  aspirin EC 325 MG tablet Take 325 mg by mouth daily.   Yes [provider]  budesonide-formoterol (SYMBICORT) 160-4.5 MCG/ACT inhaler Take 1 puffs first thing in am and then another  1 puffs about 12 hours later. Patient taking differently: Inhale 1 puff into the lungs 2 (two) times daily as needed (for respiratory  flares).  03/25/18  Yes Tanda Rockers, MD  clopidogrel (PLAVIX) 75 MG tablet Take 1 tablet (75 mg total) by mouth daily. 02/24/19 08/23/19 Yes Cheryln Manly, NP  docusate sodium (COLACE) 100 MG capsule Take 1 capsule (100 mg total) by mouth 2 (two) times daily. Patient taking differently: Take 200 mg by mouth every other day.  10/10/18  Yes Dixie Dials, MD  famotidine (PEPCID) 40 MG tablet Take 40 mg by mouth daily as needed for heartburn or indigestion.    Yes [provider]  fluticasone (FLONASE) 50 MCG/ACT nasal spray Place 2 sprays into both nostrils daily. 06/03/19  Yes [name], DO  hydrocortisone (ANUSOL-HC) 2.5 % rectal cream Place 1 application rectally 3 (three) times daily. Patient taking differently: Place 1 application rectally 3 (three) times daily as needed for hemorrhoids or anal itching.  11/26/18  Yes Cirigliano, Mary K, DO  lidocaine (XYLOCAINE) 2 % jelly Apply topically tid (plz sched with new provider for future fills) Patient taking differently: Apply 1 application topically 3 (three) times daily.  04/19/19  Yes Libby Maw, MD  lisinopril (ZESTRIL) 20 MG tablet Take 20 mg by mouth daily as needed (Heart pain).    Yes [provider]  Omega-3 Fatty Acids (FISH OIL) 1000 MG CAPS Take 1 g by mouth daily.   Yes [provider]  potassium chloride (K-DUR) 10 MEQ tablet Take 1 tablet (10 mEq total) by mouth daily. Patient taking differently: Take 10 mEq by mouth See admin instructions. Three times a week 10/10/18  Yes Dixie Dials, MD  rosuvastatin (CRESTOR) 40 MG tablet Take 1 tablet (40 mg total) by mouth daily. 01/19/19 06/16/19 Yes O'Neal, Cassie Freer, MD  sildenafil (VIAGRA) 100 MG tablet Take 1 tablet (100 mg total) by mouth daily as needed for erectile dysfunction. 06/03/19  Yes [name], DO  tamsulosin (FLOMAX) 0.4 MG CAPS capsule Take 1 capsule (0.4 mg total) by mouth daily after supper. 06/03/19  Yes [name], DO     Physical Exam: Vitals:   06/16/19 2000 06/16/19 2015 06/16/19 2030 06/16/19 2115  BP: (!) 153/82 (!) 189/85 127/82 (!) 149/82  Pulse: 61 65 72 68  Resp: 17 (!) 22 19 20   Temp:      TempSrc:      SpO2: 97% 99% 99% 98%  Weight:      Height:        Constitutional: NAD, calm, comfortable Eyes: PERRL, lids and conjunctivae normal ENMT: Mucous membranes are moist. Posterior pharynx clear of any exudate or lesions.Normal dentition.  Neck: normal, supple, no masses, no thyromegaly Respiratory: clear to auscultation bilaterally, no wheezing, no crackles. Normal respiratory effort. No accessory muscle use.  Cardiovascular: Regular rate and rhythm, no murmurs / rubs / gallops. No extremity edema. 2+ pedal  pulses. No carotid bruits.  Abdomen: no tenderness, no masses palpated. No hepatosplenomegaly. Bowel sounds positive.  Musculoskeletal: no clubbing / cyanosis. No joint deformity upper and lower extremities. Good ROM, no contractures. Normal muscle tone.  Skin: no rashes, lesions, ulcers. No induration Neurologic: CN 2-12 grossly intact. Sensation intact, DTR normal. Strength 5/5 in all 4.  Psychiatric: Normal judgment and insight. Alert and oriented x 3. Normal mood.    Labs on Admission: I have personally reviewed following labs and imaging studies  CBC: Recent Labs  Lab 06/16/19 1905  WBC 8.0  NEUTROABS 4.5  HGB 12.4*  HCT 39.9  MCV 87.1  PLT [id number]   Basic Metabolic Panel: Recent Labs  Lab 06/16/19 1905  NA 142  K 3.8  CL 108  CO2 22  GLUCOSE 81  BUN 14  CREATININE 1.04  CALCIUM 9.2   GFR: Estimated Creatinine Clearance: 53.8 mL/min (by C-G formula based on SCr of 1.04 mg/dL). Liver Function Tests: Recent Labs  Lab 06/16/19 1905  AST 19  ALT 15  ALKPHOS 45  BILITOT 0.6  PROT 5.8*  ALBUMIN 3.4*   No results for input(s): LIPASE, AMYLASE in the last 168 hours. No results for input(s): AMMONIA in the last 168 hours. Coagulation Profile: Recent Labs  Lab  06/16/19 1905  INR 1.0   Cardiac Enzymes: No results for input(s): CKTOTAL, CKMB, CKMBINDEX, TROPONINI in the last 168 hours. BNP (last 3 results) No results for input(s): PROBNP in the last 8760 hours. HbA1C: Recent Labs    06/16/19 1905  HGBA1C 5.6   CBG: No results for input(s): GLUCAP in the last 168 hours. Lipid Profile: Recent Labs    06/16/19 1905  CHOL 172  HDL 85  LDLCALC 70  TRIG 85  CHOLHDL 2.0   Thyroid Function Tests: No results for input(s): TSH, T4TOTAL, FREET4, T3FREE, THYROIDAB in the last 72 hours. Anemia Panel: No results for input(s): VITAMINB12, FOLATE, FERRITIN, TIBC, IRON, RETICCTPCT in the last 72 hours. Urine analysis:    Component Value Date/Time   COLORURINE YELLOW 01/23/2019 2022   APPEARANCEUR CLEAR 01/23/2019 2022   LABSPEC 1.021 01/23/2019 2022   PHURINE 6.0 01/23/2019 2022   GLUCOSEU NEGATIVE 01/23/2019 2022   HGBUR NEGATIVE 01/23/2019 2022   BILIRUBINUR negative 02/01/2019 0940   KETONESUR 5 (A) 01/23/2019 2022   PROTEINUR Positive (A) 02/01/2019 0940   PROTEINUR 30 (A) 01/23/2019 2022   UROBILINOGEN 0.2 02/01/2019 0940   NITRITE negative 02/01/2019 0940   NITRITE NEGATIVE 01/23/2019 2022   LEUKOCYTESUR Negative 02/01/2019 0940   LEUKOCYTESUR NEGATIVE 01/23/2019 2022    Radiological Exams on Admission: No results found.  EKG: Independently reviewed.  Assessment/Plan Principal Problem:   Chest pain, rule out acute myocardial infarction Active Problems:   Essential hypertension   CAD (coronary artery disease)    1. CP r/o - 1. CP obs pathway 2. Serial trops 3. Tele monitor 4. NPO after MN 5. Message sent to P. Trent for cards eval in AM 6. Heparin gtt 7. NTG paste (use GTT if needed). 2. CAD - 1. Cont statin 3. HTN - 1. Cont flomax 2. Looks like hes only taking lisinopril as a PRN med for 'chest pain' 3. Will hold off on lisinopril for the moment since we will be dropping his BP with NTG most likely  DVT  prophylaxis: Heparin gtt Code Status: Full Family Communication: No family in room Disposition Plan: Home after CP work up / treatment Consults called: Message sent to P.Abagail Kitchens for  cards eval in AM Admission status: Place in obs    Saundra Gin, [place] [facility]  How to contact the [facility] Attending or Consulting provider [place] or covering provider during after hours [place], for this patient?  1. Check the care team in [facility] and look for a) attending/consulting [facility] provider listed and b) the [facility] team listed 2. Log into [email or web address]  Amion Physician Scheduling and messaging for groups and whole hospitals  On call and physician scheduling software for group practices, residents, hospitalists and other medical providers for call, clinic, rotation and shift schedules. On[employer] is a hospital-wide system for scheduling doctors and paging doctors on call. EasyPlot is for scientific plotting and data analysis.  [email or web address]  and use [facility]'s universal password to access. If you do not have the password, please contact the hospital operator.  3. Locate the [facility] provider you are looking for under [facility] and page to a number that you can be directly reached. 4. If you still have difficulty reaching the provider, please page the [facility] (Director on Call) for the Hospitalists listed on amion for assistance.  06/16/2019, 9:28 PM

## 2019-06-16 NOTE — ED Provider Notes (Signed)
[facility] EMERGENCY DEPARTMENT Provider Note   CSN: [id number] Arrival date & time: 06/16/19  1854   History Chief Complaint  Patient presents with  . Code STEMI    Shane Carrillo is a 74 y.o. male.  The history is provided by the patient.  He has history of hypertension, hyperlipidemia, coronary artery disease status post coronary artery bypass in 2008, and was brought in by ambulance as a code STEMI.  He states that he had been moving furniture today and he felt out of breath.  This was at about 3:30 PM.  His daughter noted that he was out of breath and got worried and called for an ambulance.  He denies chest pain, heaviness, tightness, pressure.  He denies nausea, vomiting, diaphoresis.  EMS gave him 1 nitroglycerin which did not seem to impact his discomfort.  Aspirin was not given.  Past Medical History:  Diagnosis Date  . Aortic ectasia, abdominal ([place]) 08/2016   On medicare screening u/s: Abdominal aortic atherosclerosis and mild ectasia. Maximal diameter 2.8 cm-->repeat u/s 5 yrs.  [name] BPH with obstruction/lower urinary tract symptoms   . Chronic cough    upper airway cough syndrome/irritable larynx syndrome->Dr. Melvyn Novas.  [name] COPD (chronic obstructive pulmonary disease) ([place])   . Coronary artery disease    Stent.    . DOE (dyspnea on exertion)    spirometry 02/2018 normal, but needs full PFT's per Dr. Melvyn Novas 03/2018.  No bronchodilators indicated as of 03/2018.  [name] GERD (gastroesophageal reflux disease)    Dysphagia  . Hypercholesterolemia    Intol of atorva and simva  . Hypertension   . PAD (peripheral artery disease) (HCC)    Abd aortic athero  . Tobacco dependence   . Unintentional weight loss 2019   +Dysphagia,dec appetite/abd pain->pt was set to get EGD/colonoscopy late 2019 but he never followed up with GI to get this.    Patient Active Problem List   Diagnosis Date Noted  . Allergic rhinitis 06/03/2019  . Rectal bleeding 02/07/2019  . Mid back  pain 02/01/2019  . Peripheral arterial disease ([place]) 01/26/2019  . Inflamed external hemorrhoid 11/23/2018  . Unintended weight loss 11/02/2018  . Essential hypertension 11/02/2018  . Benign prostatic hyperplasia with urinary frequency 11/02/2018  . Abnormal TSH 11/02/2018  . CAD (coronary artery disease) 11/02/2018  . Acute coronary syndrome ([place]) 10/09/2018  . Cigarette smoker 02/26/2018  . DOE (dyspnea on exertion) 02/25/2018    Past Surgical History:  Procedure Laterality Date  . ABDOMINAL AORTOGRAM W/LOWER EXTREMITY N/A 02/24/2019   Procedure: ABDOMINAL AORTOGRAM W/LOWER EXTREMITY;  Surgeon: Lorretta Harp, MD;  Location: [facility];  Service: Cardiovascular;  Laterality: N/A;  . CORONARY ANGIOPLASTY WITH STENT PLACEMENT     Dr. Ouida Sills in [place], [place].  [name] FOOT SURGERY    . PERIPHERAL VASCULAR INTERVENTION Left 02/24/2019   Procedure: PERIPHERAL VASCULAR INTERVENTION;  Surgeon: Lorretta Harp, MD;  Location: [facility];  Service: Cardiovascular;  Laterality: Left;  . ROTATOR CUFF REPAIR         Family History  Problem Relation Age of Onset  . Cancer - Cervical Mother   . Colon cancer Mother   . Colon cancer Father   . Prostate cancer Father   . Diabetes Maternal Grandmother     Social History   Tobacco Use  . Smoking status: Current Every Day Smoker    Packs/day: 1.00    Years: 55.00    Pack years: 55.00  Types: Cigarettes  . Smokeless tobacco: Never Used  Substance Use Topics  . Alcohol use: Yes    Comment: 4 days out of the week  . Drug use: No    Home Medications Prior to Admission medications   Medication Sig Start Date End Date Taking? Authorizing Provider  albuterol (PROAIR HFA) 108 (90 Base) MCG/ACT inhaler Inhale 2 puffs into the lungs every 6 (six) hours as needed for wheezing or shortness of breath.     [provider]  amLODipine (NORVASC) 5 MG tablet Take 1 tablet (5 mg total) by mouth daily. 10/10/18   Dixie Dials, MD  aspirin EC 325 MG tablet Take 325 mg by mouth daily.    [provider]  budesonide-formoterol (SYMBICORT) 160-4.5 MCG/ACT inhaler Take 1 puffs first thing in am and then another  1 puffs about 12 hours later. Patient taking differently: Inhale 1 puff into the lungs 2 (two) times daily as needed (for respiratory flares).  03/25/18   Tanda Rockers, MD  clopidogrel (PLAVIX) 75 MG tablet Take 1 tablet (75 mg total) by mouth daily. 02/24/19 08/23/19  Cheryln Manly, NP  docusate sodium (COLACE) 100 MG capsule Take 1 capsule (100 mg total) by mouth 2 (two) times daily. Patient taking differently: Take 200 mg by mouth every other day.  10/10/18   Dixie Dials, MD  dutasteride (AVODART) 0.5 MG capsule Take 1 capsule (0.5 mg total) by mouth daily. 03/25/18   Tanda Rockers, MD  famotidine (PEPCID) 40 MG tablet Take 40 mg by mouth daily as needed for heartburn or indigestion.     [provider]  fluticasone (FLONASE) 50 MCG/ACT nasal spray Place 2 sprays into both nostrils daily. 06/03/19   [name], DO  hydrocortisone (ANUSOL-HC) 2.5 % rectal cream Place 1 application rectally 3 (three) times daily. Patient taking differently: Place 1 application rectally 3 (three) times daily as needed for hemorrhoids or anal itching.  11/26/18   Cirigliano, Garvin Fila, DO  lidocaine (XYLOCAINE) 2 % jelly Apply topically tid (plz sched with new provider for future fills) 04/19/19   Libby Maw, MD  lisinopril (ZESTRIL) 20 MG tablet Take 20 mg by mouth daily as needed (Heart pain).     [provider]  naproxen sodium (ALEVE) 220 MG tablet Take 220-440 mg by mouth 2 (two) times daily as needed (for pain or headaches).    [provider]  Omega-3 Fatty Acids (FISH OIL) 1000 MG CAPS Take 1 g by mouth daily.    [provider]  pantoprazole (PROTONIX) 40 MG tablet Take 40 mg by mouth daily.    [provider]  potassium chloride (K-DUR) 10 MEQ tablet Take  1 tablet (10 mEq total) by mouth daily. Patient taking differently: Take 10 mEq by mouth See admin instructions. Three times a week 10/10/18   Dixie Dials, MD  rosuvastatin (CRESTOR) 40 MG tablet Take 1 tablet (40 mg total) by mouth daily. 01/19/19 06/03/19  O'NealCassie Freer, MD  sildenafil (VIAGRA) 100 MG tablet Take 1 tablet (100 mg total) by mouth daily as needed for erectile dysfunction. 06/03/19   [name], DO  tamsulosin (FLOMAX) 0.4 MG CAPS capsule Take 1 capsule (0.4 mg total) by mouth daily after supper. 06/03/19   [name], DO    Allergies    Aspirin  Review of Systems   Review of Systems  All other systems reviewed and are negative.   Physical Exam Updated Vital Signs BP [name])  151/84 (BP Location: Right Arm)   Temp 97.8 F (36.6 C) (Tympanic)   Resp (!) 24   Ht 5\' 4"  (1.626 m)   Wt 61.7 kg   SpO2 97%   BMI 23.34 kg/m   Physical Exam Vitals and nursing note reviewed.   74 year old male, resting comfortably and in no acute distress. Vital signs are significant for elevated blood pressure and mildly elevated respiratory rate. Oxygen saturation is 97%, which is normal. Head is normocephalic and atraumatic. PERRLA, EOMI. Oropharynx is clear. Neck is nontender and supple without adenopathy or JVD. Back is nontender and there is no CVA tenderness. Lungs are clear without rales, wheezes, or rhonchi. Chest is nontender. Heart has regular rate and rhythm without murmur. Abdomen is soft, flat, nontender without masses or hepatosplenomegaly and peristalsis is normoactive. Extremities have no cyanosis or edema, full range of motion is present. Skin is warm and dry without rash. Neurologic: Mental status is normal, cranial nerves are intact, there are no motor or sensory deficits.  ED Results / Procedures / Treatments   Labs (all labs ordered are listed, but only abnormal results are displayed) Labs Reviewed  HEMOGLOBIN A1C  CBC WITH DIFFERENTIAL/PLATELET    PROTIME-INR  APTT  COMPREHENSIVE METABOLIC PANEL  LIPID PANEL  TROPONIN I (HIGH SENSITIVITY)    EKG EKG Interpretation  Date/Time:  Thursday June 16 2019 18:58:19 EDT Ventricular Rate:  84 PR Interval:    QRS Duration: 86 QT Interval:  355 QTC Calculation: 420 R Axis:   44 Text Interpretation: Sinus rhythm Left ventricular hypertrophy Anterior infarct, old Abnormal T, consider ischemia, diffuse leads When compared with ECG of 01/24/2019, T wave inversion Inferior leads is now present Confirmed by Delora Fuel ([id number]) on 06/16/2019 7:04:24 PM   EKG Interpretation  Date/Time:  Thursday June 16 2019 20:30:19 EDT Ventricular Rate:  73 PR Interval:    QRS Duration: 85 QT Interval:  399 QTC Calculation: 440 R Axis:   44 Text Interpretation: Sinus rhythm Left ventricular hypertrophy Anterior infarct, old Abnormal T, consider ischemia, diffuse leads When compared with ECG of EARLIER SAME DATE No significant change was found Confirmed by Delora Fuel ([id number]) on 06/16/2019 8:32:56 PM       Radiology No results found.  Procedures Procedures  CRITICAL CARE Performed by: Delora Fuel Total critical care time: 45 minutes Critical care time was exclusive of separately billable procedures and treating other patients. Critical care was necessary to treat or prevent imminent or life-threatening deterioration. Critical care was time spent personally by me on the following activities: development of treatment plan with patient and/or surrogate as well as nursing, discussions with consultants, evaluation of patient's response to treatment, examination of patient, obtaining history from patient or surrogate, ordering and performing treatments and interventions, ordering and review of laboratory studies, ordering and review of radiographic studies, pulse oximetry and re-evaluation of patient's condition.  Medications Ordered in ED Medications  0.9 %  sodium chloride infusion (has no  administration in time range)    ED Course  I have reviewed the triage vital signs and the nursing notes.  Pertinent labs & imaging results that were available during my care of the patient were reviewed by me and considered in my medical decision making (see chart for details).  MDM Rules/Calculators/A&P Dyspnea and patient with known coronary artery disease.  ECG from EMS does show anterior ST elevation, but have been present on prior ECG.  There is new T wave inversion in the inferior leads  which is worrisome, but, without chest pain with ST elevation being pre-existing, code STEMI was canceled.  He is given aspirin.  Will initiate cardiac work-up and anticipate need for hospital admission with new T wave inversions.  8:34 PM Patient complains of chest pain.  ECG is repeated and is unchanged.  However, given onset of chest pain, will add heparin.  Initial work-up is significant for normal troponin and mild anemia.   8:46 PM Pain was completely relieved with nitroglycerin.  Nitroglycerin ointment is added to his regimen.  Case is discussed with Dr. Alcario Drought of [facility] hospitalist, who agrees to admit the patient.  Final Clinical Impression(s) / ED Diagnoses Final diagnoses:  STEMI (ST elevation myocardial infarction) [facility])    Rx / [place] Orders ED Discharge Orders    None       Delora Fuel, MD [id number]

## 2019-06-16 NOTE — Progress Notes (Signed)
ANTICOAGULATION CONSULT NOTE - Initial Consult  Pharmacy Consult for heparin Indication: chest pain/ACS  Allergies  Allergen Reactions  . Aspirin Other (See Comments)    GI upset - Pt states that he is able to take the coated form     Patient Measurements: Height: 5\' 4"  (162.6 cm) Weight: 61.7 kg (136 lb) IBW/kg (Calculated) : 59.2 Heparin Dosing Weight: 61 kg   Vital Signs: Temp: 97.8 F (36.6 C) (04/29 1902) Temp Source: Tympanic (04/29 1902) BP: 141/84 (04/29 1915) Pulse Rate: 75 (04/29 1915)  Labs: Recent Labs    06/16/19 1905  HGB 12.4*  HCT 39.9  PLT 236  APTT 35  LABPROT 12.8  INR 1.0  CREATININE 1.04  TROPONINIHS 15    Estimated Creatinine Clearance: 53.8 mL/min (by C-G formula based on SCr of 1.04 mg/dL).   Medical History: Past Medical History:  Diagnosis Date  . Aortic ectasia, abdominal ([place]) 08/2016   On medicare screening u/s: Abdominal aortic atherosclerosis and mild ectasia. Maximal diameter 2.8 cm-->repeat u/s 5 yrs.  [name] BPH with obstruction/lower urinary tract symptoms   . Chronic cough    upper airway cough syndrome/irritable larynx syndrome->Dr. Melvyn Novas.  [name] COPD (chronic obstructive pulmonary disease) ([place])   . Coronary artery disease    Stent.    . DOE (dyspnea on exertion)    spirometry 02/2018 normal, but needs full PFT's per Dr. Melvyn Novas 03/2018.  No bronchodilators indicated as of 03/2018.  [name] GERD (gastroesophageal reflux disease)    Dysphagia  . Hypercholesterolemia    Intol of atorva and simva  . Hypertension   . PAD (peripheral artery disease) (H[facility])    Abd aortic athero  . Tobacco dependence   . Unintentional weight loss 2019   +Dysphagia,dec appetite/abd pain->pt was set to get EGD/colonoscopy late 2019 but he never followed up with GI to get this.    Medications:  (Not in a hospital admission)   Assessment: 68 YOM with exertional SOB with ECG readings worrisome with possible STEMI per MD. Now with new chest pain in the ED.  Pharmacy consulted to start IV heparin. Hgb mildly low, Plt wnl. SCr wnl.   Goal of Therapy:  Heparin level 0.3-0.7 units/ml Monitor platelets by anticoagulation protocol: Yes   Plan:  -Heparin 3500 units IV bolus followed by heparin infusion at 750 units/hr  -F/u 8 hr HL -Monitor daily HL, CBC and s/s of bleeding   Albertina Parr, PharmD., BCPS, BCCCP Clinical Pharmacist Clinical phone for 06/16/19 until 11pm: [phone] If after 11pm, please refer to [facility] for unit-specific pharmacist

## 2019-06-16 NOTE — ED Triage Notes (Signed)
Pt arrives via EMS for exertional SOB. Pt had been moving out of his home and emotional stress. Pt and his wife getting divorce and pt had to be out today. Pt called his dtr and told her he could not breathe. Pt alert when EMS arrived. Pt feels "exhausted". Pt has hx CABG and recent stent. Denies CP. Only complaints- exhaustion and exertional SOB. STEMI called en route. EMS gave 1 nitro. Pt is allergic to noncoated ASA, has not had any ASA. BP 190/110, HR 84

## 2019-06-17 ENCOUNTER — Telehealth: Payer: Self-pay | Admitting: Cardiovascular Disease

## 2019-06-17 DIAGNOSIS — I25709 Atherosclerosis of coronary artery bypass graft(s), unspecified, with unspecified angina pectoris: Secondary | ICD-10-CM | POA: Diagnosis not present

## 2019-06-17 DIAGNOSIS — R079 Chest pain, unspecified: Secondary | ICD-10-CM | POA: Diagnosis not present

## 2019-06-17 DIAGNOSIS — I10 Essential (primary) hypertension: Secondary | ICD-10-CM | POA: Diagnosis not present

## 2019-06-17 LAB — CBC
HCT: 39.2 % (ref 39.0–52.0)
Hemoglobin: 12.3 g/dL — ABNORMAL LOW (ref 13.0–17.0)
MCH: 27.2 pg (ref 26.0–34.0)
MCHC: 31.4 g/dL (ref 30.0–36.0)
MCV: 86.7 fL (ref 80.0–100.0)
Platelets: 264 10*3/uL (ref 150–400)
RBC: 4.52 MIL/uL (ref 4.22–5.81)
RDW: 15 % (ref 11.5–15.5)
WBC: 8.9 10*3/uL (ref 4.0–10.5)
nRBC: 0 % (ref 0.0–0.2)

## 2019-06-17 LAB — TROPONIN I (HIGH SENSITIVITY)
Troponin I (High Sensitivity): 16 ng/L (ref <18)
Troponin I (High Sensitivity): 14 ng/L (ref <18)
Troponin I (High Sensitivity): 13 ng/L (ref <18)

## 2019-06-17 LAB — BRAIN NATRIURETIC PEPTIDE: B Natriuretic Peptide: 52.4 pg/mL (ref 0.0–100.0)

## 2019-06-17 LAB — HEPARIN LEVEL (UNFRACTIONATED)
Heparin Unfractionated: 0.17 IU/mL — ABNORMAL LOW (ref 0.30–0.70)
Heparin Unfractionated: 0.26 IU/mL — ABNORMAL LOW (ref 0.30–0.70)

## 2019-06-17 LAB — D-DIMER, QUANTITATIVE: D-Dimer, Quant: 0.34 ug/mL-FEU (ref 0.00–0.50)

## 2019-06-17 MED ORDER — HEPARIN BOLUS VIA INFUSION
1000.0000 [IU] | Freq: Once | INTRAVENOUS | Status: AC
  Administered 2019-06-17: 07:00:00 1000 [IU] via INTRAVENOUS
  Filled 2019-06-17: qty 1000

## 2019-06-17 NOTE — Telephone Encounter (Signed)
Called patient back- he states that he has left the hospital, AMA- because they were not doing anything for him, and he wanted to make sure with Dr.O'Neal of the plan, and if he needed a cath, and if so he wants him to set him up, and go over it with him because he had a lot of people telling him different things in the hospital-  I advised with patient that he was on vacation this week, but would return on Monday- patient requested an appointment, 11:00 spot was open, patient was placed there, but advised if any worsening symptoms to go to the ER, not to wait for this appointment- and to do what they suggested. Patient verbalized understanding.  Will route to MD to make him aware.  Thanks!

## 2019-06-17 NOTE — Telephone Encounter (Signed)
Patient is currently admitted. He states that Dr. Audie Box did a heart cath on him in January. The hospital is wanting to do another one and he is declining. He is requesting to speak with Dr. Heather Roberts nurse. He is aware that Dr. Audie Box is on vacation. Please advise.

## 2019-06-17 NOTE — Progress Notes (Signed)
[facility] for heparin Indication: chest pain/ACS  Allergies  Allergen Reactions  . Aspirin Other (See Comments)    GI upset - Pt states that he is able to take the coated form     Patient Measurements: Height: 5\' 4"  (162.6 cm) Weight: 61.7 kg (136 lb) IBW/kg (Calculated) : 59.2 Heparin Dosing Weight: 61 kg   Vital Signs: Temp: 97.8 F (36.6 C) (04/29 1902) Temp Source: Tympanic (04/29 1902) BP: 164/75 (04/30 0500) Pulse Rate: 57 (04/30 0500)  Labs: Recent Labs    06/16/19 1905 06/16/19 2140 06/17/19 0012 06/17/19 0013 06/17/19 0105 06/17/19 0305 06/17/19 0520  HGB 12.4*  --   --  12.3*  --   --   --   HCT 39.9  --   --  39.2  --   --   --   PLT 236  --   --  264  --   --   --   APTT 35  --   --   --   --   --   --   LABPROT 12.8  --   --   --   --   --   --   INR 1.0  --   --   --   --   --   --   HEPARINUNFRC  --   --  0.26*  --   --   --  0.17*  CREATININE 1.04  --   --   --   --   --   --   TROPONINIHS 15   < > 16  --  14 13  --    < > = values in this interval not displayed.    Estimated Creatinine Clearance: 53.8 mL/min (by C-G formula based on SCr of 1.04 mg/dL).  Assessment: 10 YOM with exertional SOB with ECG readings worrisome with possible STEMI per MD. Now with new chest pain in the ED. Pharmacy consulted to start IV heparin.  Heparin level subtherapeutic (0.17) on gtt at 750 units/hr. No issues with line or bleeding reported per RN.  Goal of Therapy:  Heparin level 0.3-0.7 units/ml Monitor platelets by anticoagulation protocol: Yes   Plan:  Rebolus heparin 1000 units IV Increase heparin gtt to 950 units/hr  F/u 8 hr heparin level  Sherlon Handing, PharmD, BCPS Please see amion for complete clinical pharmacist phone list 06/17/2019 6:05 AM

## 2019-06-17 NOTE — ED Notes (Signed)
5C and pt placement notified of pt's decision to leave AMA

## 2019-06-17 NOTE — Telephone Encounter (Signed)
Patient called again, he wants to come into the office on Monday to see Dr. Audie Box.

## 2019-06-17 NOTE — ED Notes (Addendum)
Pt discharged AMA.

## 2019-06-17 NOTE — Discharge Summary (Signed)
Discharge Summary  Shane Carrillo [id number] DOB: [birth year]  PCP: [name], DO  Admit date: 06/16/2019 Discharge date: 06/17/2019  Time spent: 30 mins  Recommendations for Outpatient Follow-up:  1. Follow up with cardiology  Discharge Diagnoses:  Active Hospital Problems   Diagnosis Date Noted  . Chest pain, rule out acute myocardial infarction 06/16/2019  . CAD (coronary artery disease) 11/02/2018  . Essential hypertension 11/02/2018    Resolved Hospital Problems  No resolved problems to display.    Discharge Condition: Left AMA  Diet recommendation: Left AMA  Vitals:   06/17/19 1224 06/17/19 1257  BP: (!) 148/66 (!) 182/93  Pulse: (!) 59 79  Resp: 17 (!) 21  Temp:  99 F (37.2 C)  SpO2: 100% 100%    History of present illness:  Shane Carrillo is a 74 y.o. male with medical history significant of CAD s/p CABG and stent in 2008, PAD s/p stent 02/2019 with Dr. Gwenlyn Found, HTN. Last stress test was low risk (showed fixed defect, 49% EF) in Aug 2020. Last 2D echo showed normal EF in Aug 2020. Last LHC was ~2011 or 2012. Pt presents to ED with c/o SOB initially.  He states that he had been moving furniture today and he felt out of breath. This was at about 3:30 PM. His daughter noted that he was out of breath and got worried and called for an ambulance. In the ED, EKG shows new TWI / ST depressions in inferior leads.  Lateral leads are chronic.  Also shows LVH. While in ED developed CP up to 7/10, improved to 5/10 after 1 SL NTG. Got ASA. NTG paste being applied, repeat EKG unchanged. Trop, BNP, d-dimer negative.     Discussed with patient extensively today, reports he knows what happened and he had overexerted himself by moving furniture's into his new home.  Advised patient to wait on cardiology to be evaluated given his significant cardiac history, current smoker.  Shortly after my discussion, patient signed AMA, stating he does not want to be seen by cardiology  anymore and will plan to follow-up with them as an outpatient.  Risks of leaving AMA including death was discussed with patient, patient verbalized understanding.  Hospital Course:  Principal Problem:   Chest pain, rule out acute myocardial infarction Active Problems:   Essential hypertension   CAD (coronary artery disease)   Likely cardiac chest pain/history of CAD s/p CABG Extensive cardiac history, smoker Signed AMA Troponins negative, EKG with ST depression in inferior leads Stress test and echo done August 2020 Heparin was started, nitroglycerin paste applied Cardiology consulted (confirmed, patient was on the list to be seen), patient signed AMA before he was seen  Hypertension Continue home regimen       Malnutrition Type:      Malnutrition Characteristics:      Nutrition Interventions:      Estimated body mass index is 23.34 kg/m as calculated from the following:   Height as of this encounter: 5\' 4"  (1.626 m).   Weight as of this encounter: 61.7 kg.    Procedures:  None  Consultations:  Cardiology  Discharge Exam: BP (!) 182/93 (BP Location: Right Arm) Comment: Simultaneous filing. User may not have seen previous data.  Pulse 79   Temp 99 F (37.2 C) (Oral)   Resp (!) 21 Comment: Simultaneous filing. User may not have seen previous data.  Ht 5\' 4"  (1.626 m)   Wt 61.7 kg   SpO2 100%   BMI  23.34 kg/m   General: NAD Cardiovascular: S1, S2 present Respiratory: CTA B  Discharge Instructions You were cared for by a hospitalist during your hospital stay. If you have any questions about your discharge medications or the care you received while you were in the hospital after you are discharged, you can call the unit and asked to speak with the hospitalist on call if the hospitalist that took care of you is not available. Once you are discharged, your primary care physician will handle any further medical issues. Please note that NO REFILLS for any  discharge medications will be authorized once you are discharged, as it is imperative that you return to your primary care physician (or establish a relationship with a primary care physician if you do not have one) for your aftercare needs so that they can reassess your need for medications and monitor your lab values.    Allergies  Allergen Reactions  . Aspirin Other (See Comments)    GI upset - Pt states that he is able to take the coated form       The results of significant diagnostics from this hospitalization (including imaging, microbiology, ancillary and laboratory) are listed below for reference.    Significant Diagnostic Studies: DG CHEST PORT 1 VIEW  Result Date: 06/16/2019 CLINICAL DATA:  Chest pain EXAM: PORTABLE CHEST 1 VIEW COMPARISON:  01/23/2019 FINDINGS: Hazy lung base opacities. No pleural effusion. Stable cardiomediastinal silhouette with aortic atherosclerosis. No pneumothorax. Clips over the left apex. IMPRESSION: Hazy basilar opacities, favor atelectasis over mild pneumonia. Electronically Signed   By: Donavan Foil M.D.   On: 06/16/2019 21:50    Microbiology: Recent Results (from the past 240 hour(s))  Respiratory Panel by RT PCR (Flu A&B, Covid) - Nasopharyngeal Swab     Status: None   Collection Time: 06/16/19  8:38 PM   Specimen: Nasopharyngeal Swab  Result Value Ref Range Status   SARS Coronavirus 2 by RT PCR NEGATIVE NEGATIVE Final    Comment: (NOTE) SARS-CoV-2 target nucleic acids are NOT DETECTED. The SARS-CoV-2 RNA is generally detectable in upper respiratoy specimens during the acute phase of infection. The lowest concentration of SARS-CoV-2 viral copies this assay can detect is 131 copies/mL. A negative result does not preclude SARS-Cov-2 infection and should not be used as the sole basis for treatment or other patient management decisions. A negative result may occur with  improper specimen collection/handling, submission of specimen other than  nasopharyngeal swab, presence of viral mutation(s) within the areas targeted by this assay, and inadequate number of viral copies (<131 copies/mL). A negative result must be combined with clinical observations, patient history, and epidemiological information. The expected result is Negative. Fact Sheet for Patients:  PinkCheek.be Fact Sheet for Healthcare Providers:  GravelBags.it This test is not yet ap proved or cleared by the [place] FDA and  has been authorized for detection and/or diagnosis of SARS-CoV-2 by FDA under an Emergency Use Authorization (EUA). This EUA will remain  in effect (meaning this test can be used) for the duration of the COVID-19 declaration under Section 564(b)(1) of the Act, 21 U.S.C. section 360bbb-3(b)(1), unless the authorization is terminated or revoked sooner.    Influenza A by PCR NEGATIVE NEGATIVE Final   Influenza B by PCR NEGATIVE NEGATIVE Final    Comment: (NOTE) The Xpert Xpress SARS-CoV-2/FLU/RSV assay is intended as an aid in  the diagnosis of influenza from Nasopharyngeal swab specimens and  should not be used as a sole basis for treatment.  Nasal washings and  aspirates are unacceptable for Xpert Xpress SARS-CoV-2/FLU/RSV  testing. Fact Sheet for Patients: PinkCheek.be Fact Sheet for Healthcare Providers: GravelBags.it This test is not yet approved or cleared by the [place] FDA and  has been authorized for detection and/or diagnosis of SARS-CoV-2 by  FDA under an Emergency Use Authorization (EUA). This EUA will remain  in effect (meaning this test can be used) for the duration of the  Covid-19 declaration under Section 564(b)(1) of the Act, 21  U.S.C. section 360bbb-3(b)(1), unless the authorization is  terminated or revoked. Performed at [facility], [place] [address], [place],  [address]       Labs: Basic Metabolic Panel: Recent Labs  Lab 06/16/19 1905  NA 142  K 3.8  CL 108  CO2 22  GLUCOSE 81  BUN 14  CREATININE 1.04  CALCIUM 9.2   Liver Function Tests: Recent Labs  Lab 06/16/19 1905  AST 19  ALT 15  ALKPHOS 45  BILITOT 0.6  PROT 5.8*  ALBUMIN 3.4*   No results for input(s): LIPASE, AMYLASE in the last 168 hours. No results for input(s): AMMONIA in the last 168 hours. CBC: Recent Labs  Lab 06/16/19 1905 06/17/19 0013  WBC 8.0 8.9  NEUTROABS 4.5  --   HGB 12.4* 12.3*  HCT 39.9 39.2  MCV 87.1 86.7  PLT 236 264   Cardiac Enzymes: No results for input(s): CKTOTAL, CKMB, CKMBINDEX, TROPONINI in the last 168 hours. BNP: BNP (last 3 results) Recent Labs    06/17/19 0942  BNP 52.4    ProBNP (last 3 results) No results for input(s): PROBNP in the last 8760 hours.  CBG: No results for input(s): GLUCAP in the last 168 hours.     Signed:  Alma Friendly, MD [facility] 06/17/2019, 1:39 PM

## 2019-06-17 NOTE — ED Notes (Signed)
Pt desires to leave AMA. Hospitalist notified of patient's decision. Hospitalist states she has spoken with cards team and that he is on the list to be seen, however, pt states he no longer wants to wait for them to see him and that he will be following up outpatient with his cardiologist. Pt AOx4. Pt advised of risks of leaving AMA and pt verbalizes understanding.

## 2019-06-17 NOTE — ED Notes (Signed)
Pt reports he knows that nitro is going to give him terrible headache and doesn't think chest pain warrants it at this time. Tylenol given.

## 2019-06-19 NOTE — Progress Notes (Signed)
Cardiology Office Note:   Date:  06/20/2019  NAME:  Shane Carrillo    MRN: [id number] DOB:  1945/06/14   PCP:  [name], DO  Cardiologist:  Evalina Field, MD   Referring MD: [name], DO   Chief Complaint  Patient presents with  . Coronary Artery Disease    History of Present Illness:   Shane Carrillo is a 74 y.o. male with a hx of CAD, PAD, HTN, HLD, tobacco abuse who presents for hospital follow-up. Was seen in the ER 4/29-4/30 for chest pain that responded to nitro. Enzymes negative x 2. EKG with inferolateral TWI. Left AMA as felt better. Follow-up today. Recent intervention to L SFA and plans for R SFA intervention soon. Extensive CAD history at [facility] in [place] that started in 2008 detailed below.   Was recently evaluated in the hospital 4/29 for exertional chest tightness. Had one time episode that responded to NTG. HS trop 14->13. EKG with LVH and repol abnormality and no real change from his prior. Apparently there were concerns for new TWI in the inferior leads. He was upset with his care as he did not see Cardiology. Unclear why this did not happen. He has been doing well without further recurrence. Reports walking up to 1/2 mile without CP or SOB. BP elevated today and he is not on medications. He does get SOB but still smoking 1/2 ppd. Trying to quit and interested in buproprion. Denies CP, SOB, palpitations today. Still has cramping in R leg with exertion and plans for R SFA intervention soon.   Problem List 1. CAD  -1v CABG 2008, LIMA-LAD -2008: positive stress, ostial LAD disease 85% -> unsuccessful PCI -> LIMA-LAD ([facility] [place], [place], Dr. Maren Reamer)  -[place] 2016 with 40% pLAD, patent LIMA-LAD 2. PAD -L SFA/popliteal angioplasty  3. Tobacco abuse 4. HTN 5. HLD -T chol 172, HDL 85, LDL 70, TG 85  Past Medical History: Past Medical History:  Diagnosis Date  . Aortic ectasia, abdominal ([place]) 08/2016   On medicare screening u/s: Abdominal  aortic atherosclerosis and mild ectasia. Maximal diameter 2.8 cm-->repeat u/s 5 yrs.  [name] BPH with obstruction/lower urinary tract symptoms   . Chronic cough    upper airway cough syndrome/irritable larynx syndrome->Dr. Melvyn Novas.  [name] COPD (chronic obstructive pulmonary disease) ([place])   . Coronary artery disease    Stent.    . DOE (dyspnea on exertion)    spirometry 02/2018 normal, but needs full PFT's per Dr. Melvyn Novas 03/2018.  No bronchodilators indicated as of 03/2018.  [name] GERD (gastroesophageal reflux disease)    Dysphagia  . Hypercholesterolemia    Intol of atorva and simva  . Hypertension   . PAD (peripheral artery disease) (HCC)    Abd aortic athero  . Tobacco dependence   . Unintentional weight loss 2019   +Dysphagia,dec appetite/abd pain->pt was set to get EGD/colonoscopy late 2019 but he never followed up with GI to get this.    Past Surgical History: Past Surgical History:  Procedure Laterality Date  . ABDOMINAL AORTOGRAM W/LOWER EXTREMITY N/A 02/24/2019   Procedure: ABDOMINAL AORTOGRAM W/LOWER EXTREMITY;  Surgeon: Lorretta Harp, MD;  Location: [facility];  Service: Cardiovascular;  Laterality: N/A;  . CORONARY ANGIOPLASTY WITH STENT PLACEMENT     Dr. Ouida Sills in [place], [place].  [name] FOOT SURGERY    . PERIPHERAL VASCULAR INTERVENTION Left 02/24/2019   Procedure: PERIPHERAL VASCULAR INTERVENTION;  Surgeon: Lorretta Harp, MD;  Location: [facility];  Service: Cardiovascular;  Laterality: Left;  . ROTATOR CUFF REPAIR      Current Medications: Current Meds  Medication Sig  . albuterol (PROAIR HFA) 108 (90 Base) MCG/ACT inhaler Inhale 2 puffs into the lungs every 6 (six) hours as needed for wheezing or shortness of breath.   [name] aspirin EC 325 MG tablet Take 325 mg by mouth daily.  . budesonide-formoterol (SYMBICORT) 160-4.5 MCG/ACT inhaler Take 1 puffs first thing in am and then another  1 puffs about 12 hours later. (Patient taking differently: Inhale 1 puff into the  lungs 2 (two) times daily as needed (for respiratory flares). )  . clopidogrel (PLAVIX) 75 MG tablet Take 1 tablet (75 mg total) by mouth daily.  [name] docusate sodium (COLACE) 100 MG capsule Take 1 capsule (100 mg total) by mouth 2 (two) times daily. (Patient taking differently: Take 200 mg by mouth every other day. )  . famotidine (PEPCID) 40 MG tablet Take 40 mg by mouth daily as needed for heartburn or indigestion.   . fluticasone (FLONASE) 50 MCG/ACT nasal spray Place 2 sprays into both nostrils daily.  . hydrocortisone (ANUSOL-HC) 2.5 % rectal cream Place 1 application rectally 3 (three) times daily. (Patient taking differently: Place 1 application rectally 3 (three) times daily as needed for hemorrhoids or anal itching. )  . lidocaine (XYLOCAINE) 2 % jelly Apply topically tid (plz sched with new provider for future fills) (Patient taking differently: Apply 1 application topically 3 (three) times daily. )  . lisinopril (ZESTRIL) 20 MG tablet Take 1 tablet (20 mg total) by mouth daily.  . Omega-3 Fatty Acids (FISH OIL) 1000 MG CAPS Take 1 g by mouth daily.  . potassium chloride (K-DUR) 10 MEQ tablet Take 1 tablet (10 mEq total) by mouth daily. (Patient taking differently: Take 10 mEq by mouth See admin instructions. Three times a week)  . sildenafil (VIAGRA) 100 MG tablet Take 1 tablet (100 mg total) by mouth daily as needed for erectile dysfunction.  . tamsulosin (FLOMAX) 0.4 MG CAPS capsule Take 1 capsule (0.4 mg total) by mouth daily after supper.  . [DISCONTINUED] lisinopril (ZESTRIL) 20 MG tablet Take 20 mg by mouth daily as needed (Heart pain).      Allergies:    Aspirin   Social History: Social History   Socioeconomic History  . Marital status: Divorced    Spouse name: Not on file  . Number of children: Not on file  . Years of education: Not on file  . Highest education level: Not on file  Occupational History  . Not on file  Tobacco Use  . Smoking status: Current Every Day  Smoker    Packs/day: 1.00    Years: 55.00    Pack years: 55.00    Types: Cigarettes  . Smokeless tobacco: Never Used  Substance and Sexual Activity  . Alcohol use: Yes    Comment: 4 days out of the week  . Drug use: No  . Sexual activity: Not on file  Other Topics Concern  . Not on file  Social History Narrative   Married, 12 grown children.   Orig from [place], [place].   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   Tob: 55 pack-yr hx.  No alc, no drugs.   Social Determinants of Health   Financial Resource Strain:   . Difficulty of Paying Living Expenses:   Food Insecurity:   . Worried About Charity fundraiser in the Last Year:   .  Ran Out of Food in the Last Year:   Transportation Needs:   . Film/video editor (Medical):   [name] Lack of Transportation (Non-Medical):   Physical Activity:   . Days of Exercise per Week:   . Minutes of Exercise per Session:   Stress:   . Feeling of Stress :   Social Connections:   . Frequency of Communication with Friends and Family:   . Frequency of Social Gatherings with Friends and Family:   . Attends Religious Services:   . Active Member of Clubs or Organizations:   . Attends Archivist Meetings:   [name] Marital Status:      Family History: The patient's family history includes Cancer - Cervical in his mother; Colon cancer in his father and mother; Diabetes in his maternal grandmother; Prostate cancer in his father.  ROS:   All other ROS reviewed and negative. Pertinent positives noted in the HPI.     EKGs/Labs/Other Studies Reviewed:   The following studies were personally reviewed by me today:  EKG:  EKG is ordered today.  The ekg ordered today demonstrates normal sinus rhythm, heart rate 75, LVH with secondary repolarization abnormality, and was personally reviewed by me.   LHC 05/29/2014 ([place], [place] [place]) 05/29/14: CATH - Dr. Glynis Smiles DATE OF PROCEDURE: 05/29/2014  INDICATIONS: Angina in a  post CABG patient. HISTORY: This is a 73 year old AA man with h/o CAD, s/p CABG, HTN, tobacco use presented with angina which is typical. He did not rule in for MI by Troponins. He was recommended a cath in view of his unstable angina. LEFT VENTRICULAR HEMODYNAMIC DATA: 1. Aortic blood pressure 134/84 mmHg with a mean of 106 mmHg. 2. Left ventricular end-diastolic pressure (LVEDP) was 26 mmHg. ANGIOGRAPHIC DATA: 1. LM is a large caliber vessel that gives rise to the left anterior descending artery (LAD) and left circumflex vessels respectively. Angiographically, the left main coronary vessel appears normal without evidence of obstructive coronary artery disease. 2. LAD is a moderate size vessel that tapers to termination at the apex. The left anterior descending artery (LAD) gives rise to multiple septal perforators and two diagonal branch vessels. Angiographically, the left anterior descending artery (LAD) appears to have a proximal hazy lesion that at the most is 40% and in fact may be just a bend in the LAD. 3. LCX is a large caliber vessel that gives rise to a single obtuse marginal branch vessel that bifurcates at his origin prior to entering the A-V groove circumflex continuation that rapidly tapers to its termination. Angiographically, the left circumflex system appears normal without evidence of obstructive coronary artery disease. 4. RCA is a large caliber vessel that is dominant in circulation giving rise to the posterior descending artery distally. Angiographically, the right coronary artery system appears normal without evidence of obstructive coronary artery disease. GRAFT ANGIOGRAPHY: LIMA is patent but is being under filled due to the competitive flow from native LAD. LIMA is seen to be filling retrograde during native Left coronary injections.  IMPRESSION: 1. Non flow limiting CAD in the proximal LAD. Patent LIMA to LAD. 2. Elevated LVEDP. RECOMMENDATIONS: 1. Continue aggressive risk  factor modifications.   NM 10/10/2018 1. Findings suggestive of prior inferior wall infarct. No evidence of inducible ischemia. 2. Normal left ventricular wall motion. 3. Left ventricular ejection fraction 49% 4. Non invasive risk stratification*: Low  TTE 10/10/2018 1. The left ventricle has normal systolic function with an ejection fraction of 60-65%. The cavity size was normal.  There is moderate concentric left ventricular hypertrophy. Left ventricular diastolic Doppler parameters are consistent with impaired  relaxation. No evidence of left ventricular regional wall motion abnormalities. 2. The right ventricle has normal systolic function. The cavity was normal. There is no increase in right ventricular wall thickness. 3. The mitral valve is degenerative. Mild thickening of the mitral valve leaflet. Mild calcification of the mitral valve leaflet. 4. The aortic valve is tricuspid. Mild thickening of the aortic valve. Mild calcification of the aortic valve. Aortic valve regurgitation is mild by color flow Doppler. 5. There is evidence of severe plaque in the ascending aorta.  Recent Labs: 10/29/2018: TSH 1.92 06/16/2019: ALT 15; BUN 14; Creatinine, Ser 1.04; Potassium 3.8; Sodium 142 06/17/2019: B Natriuretic Peptide 52.4; Hemoglobin 12.3; Platelets 264   Recent Lipid Panel    Component Value Date/Time   CHOL 172 06/16/2019 1905   CHOL 186 01/05/2019 1046   TRIG 85 06/16/2019 1905   HDL 85 06/16/2019 1905   HDL 76 01/05/2019 1046   CHOLHDL 2.0 06/16/2019 1905   VLDL 17 06/16/2019 1905   LDLCALC 70 06/16/2019 1905   LDLCALC 97 01/05/2019 1046    Physical Exam:   VS:  BP (!) 188/90   Pulse 75   Ht 5\' 4"  (1.626 m)   Wt 133 lb 9.6 oz (60.6 kg)   SpO2 97%   BMI 22.93 kg/m    Wt Readings from Last 3 Encounters:  06/20/19 133 lb 9.6 oz (60.6 kg)  06/16/19 136 lb (61.7 kg)  03/11/19 133 lb 6.4 oz (60.5 kg)    General: Well nourished, well developed, in no acute  distress Heart: Atraumatic, normal size  Eyes: PEERLA, EOMI  Neck: Supple, no JVD Endocrine: No thryomegaly Cardiac: Normal S1, S2; RRR; no murmurs, rubs, or gallops Lungs: Clear to auscultation bilaterally, no wheezing, rhonchi or rales  Abd: Soft, nontender, no hepatomegaly  Ext: No edema, pulses 2+ Musculoskeletal: No deformities, BUE and BLE strength normal and equal Skin: Warm and dry, no rashes   Neuro: Alert and oriented to person, place, time, and situation, CNII-XII grossly intact, no focal deficits  Psych: Normal mood and affect   ASSESSMENT:   Shane Carrillo is a 74 y.o. male who presents for the following: 1. Coronary artery disease involving coronary bypass graft of native heart with angina pectoris ([place])   2. SOB (shortness of breath)   3. Peripheral arterial disease ([place])   4. Essential hypertension   5. Mixed hyperlipidemia   6. Tobacco abuse     PLAN:   1. Coronary artery disease involving coronary bypass graft of native heart with angina pectoris ([place]) -LIMA-LAD 2008 -[place] 2016 at [facility] with patent LIMA-LAD and no further disease -recent admission 4/29 with exertional chest tightness that resolved with NTG. No further recurrence. EKG unchanged and lab work inconsistent with ACS. I think we just need to optimize BP and anginal therapy. Walking up to 1/2 mile without CP.  -add norvasc 10 mg QD -instructed to take lasix 20 mg QD -continue asa/plavix given recent peripheral intervention  -LDL at goal on crestor   2. SOB (shortness of breath) -suspect COPD -given prescription for symbicort -referral to pulmonary  3. Peripheral arterial disease (HCC) -L SFA/popliteal angioplasty  -plans for R SFA intervention by Dr. Gwenlyn Found soon -continue ASA/plavix -advised to stop smoking   4. Essential hypertension -BP severely elevated and not taking medications appropriately -add norvasc 10 mg QD -take lisinopril 20 mg QD daily (was not)  5. Mixed  hyperlipidemia -statin  6. Tobacco abuse -prescribed bupropion 100 mg daily   Disposition: Return in about 3 months (around 09/20/2019).  Medication Adjustments/Labs and Tests Ordered: Current medicines are reviewed at length with the patient today.  Concerns regarding medicines are outlined above.  Orders Placed This Encounter  Procedures  . Ambulatory referral to Pulmonology   Meds ordered this encounter  Medications  . amLODipine (NORVASC) 10 MG tablet    Sig: Take 1 tablet (10 mg total) by mouth daily.    Dispense:  180 tablet    Refill:  3  . nitroGLYCERIN (NITROSTAT) 0.4 MG SL tablet    Sig: Place 1 tablet (0.4 mg total) under the tongue every 5 (five) minutes as needed for chest pain.    Dispense:  30 tablet    Refill:  1  . lisinopril (ZESTRIL) 20 MG tablet    Sig: Take 1 tablet (20 mg total) by mouth daily.    Dispense:  90 tablet    Refill:  1  . buPROPion (WELLBUTRIN SR) 100 MG 12 hr tablet    Sig: Take 1 tablet (100 mg total) by mouth daily.    Dispense:  90 tablet    Refill:  1    Patient Instructions  Medication Instructions:  Start Amlodipine 10 mg daily  Start taking Lisinopril 20 mg daily  Use Nitroglycerin as needed for chest pain  Start Wellbutrin 100 mg daily   *If you need a refill on your cardiac medications before your next appointment, please call your pharmacy*   Follow-Up: At [facility] you and your health needs are our priority.  As part of our continuing mission to provide you with exceptional heart care, we have created designated Provider Care Teams.  These Care Teams include your primary Cardiologist (physician) and Advanced Practice Providers (APPs -  Physician Assistants and Nurse Practitioners) who all work together to provide you with the care you need, when you need it.  We recommend signing up for the patient portal called "MyChart".  Sign up information is provided on this After Visit Summary.  MyChart is used to connect with  patients for Virtual Visits (Telemedicine).  Patients are able to view lab/test results, encounter notes, upcoming appointments, etc.  Non-urgent messages can be sent to your provider as well.   To learn more about what you can do with MyChart, go to [email or web address].    Your next appointment:   3 month(s)  The format for your next appointment:   In Person  Provider:   Eleonore Chiquito, MD   Other Instructions Referral to pulmonology- they will call you with an appointment.   Make sure to take your blood pressure medications before seeing [place] at next visit.       Time Spent with Patient: I have spent a total of 35 minutes with patient reviewing hospital notes, telemetry, EKGs, labs and examining the patient as well as establishing an assessment and plan that was discussed with the patient.  > 50% of time was spent in direct patient care.  Signed, Addison Naegeli. Audie Box, [place]  [address], [place], [place] [address] [phone]  06/20/2019 1:03 PM

## 2019-06-20 ENCOUNTER — Ambulatory Visit (INDEPENDENT_AMBULATORY_CARE_PROVIDER_SITE_OTHER): Payer: Medicare Other | Admitting: Cardiovascular Disease

## 2019-06-20 ENCOUNTER — Other Ambulatory Visit: Payer: Self-pay

## 2019-06-20 ENCOUNTER — Encounter: Payer: Self-pay | Admitting: Cardiovascular Disease

## 2019-06-20 VITALS — BP 188/90 | HR 75 | Ht 64.0 in | Wt 133.6 lb

## 2019-06-20 DIAGNOSIS — I25709 Atherosclerosis of coronary artery bypass graft(s), unspecified, with unspecified angina pectoris: Secondary | ICD-10-CM | POA: Diagnosis not present

## 2019-06-20 DIAGNOSIS — R0602 Shortness of breath: Secondary | ICD-10-CM

## 2019-06-20 DIAGNOSIS — I739 Peripheral vascular disease, unspecified: Secondary | ICD-10-CM

## 2019-06-20 DIAGNOSIS — E782 Mixed hyperlipidemia: Secondary | ICD-10-CM

## 2019-06-20 DIAGNOSIS — I10 Essential (primary) hypertension: Secondary | ICD-10-CM | POA: Diagnosis not present

## 2019-06-20 DIAGNOSIS — Z720 Tobacco use: Secondary | ICD-10-CM

## 2019-06-20 MED ORDER — NITROGLYCERIN 0.4 MG SL SUBL
0.4000 mg | SUBLINGUAL_TABLET | SUBLINGUAL | 1 refills | Status: DC | PRN
Start: 2019-06-20 — End: 2019-11-17

## 2019-06-20 MED ORDER — LISINOPRIL 20 MG PO TABS: 20.0000 mg | ORAL_TABLET | Freq: Every day | ORAL | 1 refills | Status: DC

## 2019-06-20 MED ORDER — AMLODIPINE BESYLATE 10 MG PO TABS
10.0000 mg | ORAL_TABLET | Freq: Every day | ORAL | 3 refills | Status: DC
Start: 2019-06-20 — End: 2019-12-16

## 2019-06-20 MED ORDER — BUPROPION HCL ER (SR) 100 MG PO TB12: 100.0000 mg | ORAL_TABLET | Freq: Every day | ORAL | 1 refills | Status: DC

## 2019-06-20 NOTE — Patient Instructions (Signed)
Medication Instructions:  Start Amlodipine 10 mg daily  Start taking Lisinopril 20 mg daily  Use Nitroglycerin as needed for chest pain  Start Wellbutrin 100 mg daily   *If you need a refill on your cardiac medications before your next appointment, please call your pharmacy*   Follow-Up: At [facility], you and your health needs are our priority.  As part of our continuing mission to provide you with exceptional heart care, we have created designated Provider Care Teams.  These Care Teams include your primary Cardiologist (physician) and Advanced Practice Providers (APPs -  Physician Assistants and Nurse Practitioners) who all work together to provide you with the care you need, when you need it.  We recommend signing up for the patient portal called "MyChart".  Sign up information is provided on this After Visit Summary.  MyChart is used to connect with patients for Virtual Visits (Telemedicine).  Patients are able to view lab/test results, encounter notes, upcoming appointments, etc.  Non-urgent messages can be sent to your provider as well.   To learn more about what you can do with MyChart, go to [email or web address].    Your next appointment:   3 month(s)  The format for your next appointment:   In Person  Provider:   Eleonore Chiquito, MD   Other Instructions Referral to pulmonology- they will call you with an appointment.   Make sure to take your blood pressure medications before seeing [place] at next visit.

## 2019-06-24 ENCOUNTER — Other Ambulatory Visit (INDEPENDENT_AMBULATORY_CARE_PROVIDER_SITE_OTHER): Payer: Medicare Other

## 2019-06-24 DIAGNOSIS — I10 Essential (primary) hypertension: Secondary | ICD-10-CM

## 2019-07-13 ENCOUNTER — Telehealth: Payer: Self-pay | Admitting: *Deleted

## 2019-07-13 NOTE — Telephone Encounter (Signed)
Pulmonology appointment schedule on 08/12/19,left message on voicemail and letter mailed.

## 2019-08-12 ENCOUNTER — Ambulatory Visit: Payer: Medicare Other | Admitting: Internal Medicine

## 2019-08-12 NOTE — Progress Notes (Deleted)
Shane Carrillo, male    DOB: 02/20/1945,     MRN: [id number]    Brief patient profile:  74 yobm active smoker with new indolent onset progessive doe x 2017 much worse since April 2019 assoc with severe cough more day than noct > to ER 02/13/18 rx neb helped / pred / amox and self referred to pulmonary office 02/25/2018  With nl spirometry on initial eval.    History of Present Illness  02/25/2018  Pulmonary/ 1st office eval/Nona Gracey  Chief Complaint  Patient presents with  . Pulmonary Consult    Self referral. Pt c/o DOE x 2 years, worse since April 2019.  He gets winded washing his car or doing chores around the house. He also reports loss of appetite and unintended wt loss.   Dyspnea:  MMRC2 = can't walk a nl pace on a flat grade s sob but does fine slow and flat  Cough: day > noct more dry than wet  Sleep: poor sleep but not due to wheezing / sob  SABA use: not sure it helps  rec Please remember to go to the lab department   for your tests - we will call you with the results when they are available. The key is to stop smoking completely before smoking completely stops you! For smoking cessation classes call  [phone]   Please schedule a follow up office visit in 4 weeks, sooner if needed  with all medications /inhalers/ solutions in hand so we can verify exactly what you are taking. This includes all medications from all doctors and over the counters - full pfts on return    03/25/2018  f/u ov/[name] re: cough/ no meds  Chief Complaint  Patient presents with  . Shortness of Breath    non productive dry cough  Dyspnea:  About the same walks to HT s stopping and it's about 3/4 mile Cough: 10 min p stirs/ really not much mucus  Sleeping: poorly due to insomnia not resp symptoms SABA use: proair seems to help 02: none  Has sense of globus, some dysphagia  rec Start back on gabapentin 300mg  one twice Carrillo  Pantoprazole (protonix) 40 mg   Take  30-60 min before first meal of the day  and Pepcid (famotidine)  20 mg one after supper  until return to office GERD diet Please see patient coordinator before you leave today  to schedule sinus and chest ct > did not do  Please schedule a follow up office visit in 4 weeks, sooner if needed  with all medications /inhalers/ solutions in hand so we can verify exactly what you are taking. This includes all medications from all doctors and over the counters - add full pfts on return  > did not do    Admit date: 06/16/2019 Discharge date: 06/17/2019  Discharge Condition: Left AMA  History of present illness:  Shane Smoak Simmonsis a 74 y.o.malewith medical history significant ofCAD s/p CABG and stent in 2008, PAD s/p stent 02/2019 with Dr. Gwenlyn Found, HTN. Last stress test was low risk (showed fixed defect, 49% EF) in Aug 2020. Last 2D echo showed normal EF in Aug 2020. Last LHC was ~2011 or 2012. Pt presents to ED with c/o SOB initially.He states that he had been moving furniture today and he felt out of breath. This was at about 3:30 PM. His daughter noted that he was out of breath and got worried and called for an ambulance. In the ED, EKG shows new TWI /  ST depressions in inferior leads. Lateral leads are chronic. Also shows LVH. While in ED developed CP up to 7/10, improved to 5/10 after 1 SL NTG. Got ASA. NTG paste being applied, repeat EKG unchanged. Trop, BNP, d-dimer negative.   Discussed with patient extensively today, reports he knows what happened and he had overexerted himself by moving furniture's into his new home.  Advised patient to wait on cardiology to be evaluated given his significant cardiac history, current smoker.  Shortly after my discussion, patient signed AMA, stating he does not want to be seen by cardiology anymore and will plan to follow-up with them as an outpatient.  Risks of leaving AMA including death was discussed with patient, patient verbalized understanding.  Hospital Course:  Principal Problem:    Chest pain, rule out acute myocardial infarction   Essential hypertension   CAD (coronary artery disease)   Likely cardiac chest pain/history of CAD s/p CABG Extensive cardiac history, smoker Signed AMA Troponins negative, EKG with ST depression in inferior leads Stress test and echo done August 2020 Heparin was started, nitroglycerin paste applied Cardiology consulted (confirmed, patient was on the list to be seen), patient signed AMA before he was seen  Hypertension Continue home regimen      08/12/2019  f/u ov/Sahara Fujimoto re:  No chief complaint on file.    Dyspnea:  *** Cough: *** Sleeping: *** SABA use: *** 02: ***   No obvious day to day or daytime variability or assoc excess/ purulent sputum or mucus plugs or hemoptysis or cp or chest tightness, subjective wheeze or overt sinus or hb symptoms.   *** without nocturnal  or early am exacerbation  of respiratory  c/o's or need for noct saba. Also denies any obvious fluctuation of symptoms with weather or environmental changes or other aggravating or alleviating factors except as outlined above   No unusual exposure hx or h/o childhood pna/ asthma or knowledge of premature birth.  Current Allergies, Complete Past Medical History, Past Surgical History, Family History, and Social History were reviewed in [employer] record.  ROS  The following are not active complaints unless bolded Hoarseness, sore throat, dysphagia, dental problems, itching, sneezing,  nasal congestion or discharge of excess mucus or purulent secretions, ear ache,   fever, chills, sweats, unintended wt loss or wt gain, classically pleuritic or exertional cp,  orthopnea pnd or arm/hand swelling  or leg swelling, presyncope, palpitations, abdominal pain, anorexia, nausea, vomiting, diarrhea  or change in bowel habits or change in bladder habits, change in stools or change in urine, dysuria, hematuria,  rash, arthralgias, visual complaints,  headache, numbness, weakness or ataxia or problems with walking or coordination,  change in mood or  memory.        No outpatient medications have been marked as taking for the 08/12/19 encounter (Appointment) with Tanda Rockers, MD.               Objective:     08/12/2019       *** 03/25/2018         138 02/25/18 136 lb (61.7 kg)  02/13/18 148 lb (67.1 kg)  02/14/16 156 lb 7 oz (71 kg)    Vital signs reviewed  08/12/2019  - Note at rest 02 sats  ***% on ***       ,  Mild barrel  contour chest wall           I personally reviewed images and agree with radiology impression as  follows:  CXR:   06/16/19 Hazy basilar opacities, favor atelectasis over mild pneumonia.      Chemistry       Lab Results  Component Value Date   TSH 0.33 (L) 02/25/2018              Assessment

## 2019-08-29 ENCOUNTER — Ambulatory Visit (HOSPITAL_COMMUNITY)
Admission: RE | Admit: 2019-08-29 | Payer: BLUE CROSS/BLUE SHIELD | Source: Ambulatory Visit | Attending: Cardiovascular Disease | Admitting: Cardiovascular Disease

## 2019-09-26 ENCOUNTER — Ambulatory Visit (HOSPITAL_COMMUNITY): Payer: Medicare Other

## 2019-09-28 ENCOUNTER — Ambulatory Visit: Payer: Medicare Other | Admitting: Cardiovascular Disease

## 2019-10-21 ENCOUNTER — Ambulatory Visit (HOSPITAL_COMMUNITY): Payer: Medicare Other

## 2019-11-15 ENCOUNTER — Ambulatory Visit (HOSPITAL_COMMUNITY)
Admission: RE | Admit: 2019-11-15 | Payer: Medicare Other | Source: Ambulatory Visit | Attending: Cardiovascular Disease | Admitting: Cardiovascular Disease

## 2019-11-17 ENCOUNTER — Telehealth: Payer: Self-pay | Admitting: Family Medicine

## 2019-11-17 ENCOUNTER — Ambulatory Visit (INDEPENDENT_AMBULATORY_CARE_PROVIDER_SITE_OTHER): Payer: Medicare Other | Admitting: Family Medicine

## 2019-11-17 ENCOUNTER — Encounter: Payer: Self-pay | Admitting: Family Medicine

## 2019-11-17 VITALS — BP 178/86 | HR 75 | Temp 98.3°F | Wt 137.3 lb

## 2019-11-17 DIAGNOSIS — Z122 Encounter for screening for malignant neoplasm of respiratory organs: Secondary | ICD-10-CM

## 2019-11-17 DIAGNOSIS — I10 Essential (primary) hypertension: Secondary | ICD-10-CM

## 2019-11-17 DIAGNOSIS — R05 Cough: Secondary | ICD-10-CM | POA: Diagnosis not present

## 2019-11-17 DIAGNOSIS — R9389 Abnormal findings on diagnostic imaging of other specified body structures: Secondary | ICD-10-CM | POA: Diagnosis not present

## 2019-11-17 DIAGNOSIS — F17210 Nicotine dependence, cigarettes, uncomplicated: Secondary | ICD-10-CM

## 2019-11-17 DIAGNOSIS — J4521 Mild intermittent asthma with (acute) exacerbation: Secondary | ICD-10-CM

## 2019-11-17 DIAGNOSIS — F17200 Nicotine dependence, unspecified, uncomplicated: Secondary | ICD-10-CM

## 2019-11-17 DIAGNOSIS — N529 Male erectile dysfunction, unspecified: Secondary | ICD-10-CM

## 2019-11-17 DIAGNOSIS — R059 Cough, unspecified: Secondary | ICD-10-CM

## 2019-11-17 DIAGNOSIS — R519 Headache, unspecified: Secondary | ICD-10-CM

## 2019-11-17 MED ORDER — PREDNISONE 20 MG PO TABS
20.0000 mg | ORAL_TABLET | Freq: Two times a day (BID) | ORAL | 0 refills | Status: AC
Start: 2019-11-17 — End: 2019-11-22

## 2019-11-17 MED ORDER — ALBUTEROL SULFATE HFA 108 (90 BASE) MCG/ACT IN AERS: 2.0000 | INHALATION_SPRAY | Freq: Four times a day (QID) | RESPIRATORY_TRACT | 3 refills | Status: DC | PRN

## 2019-11-17 MED ORDER — LISINOPRIL 20 MG PO TABS: 20.0000 mg | ORAL_TABLET | Freq: Every day | ORAL | 1 refills | Status: DC

## 2019-11-17 MED ORDER — TAMSULOSIN HCL 0.4 MG PO CAPS
0.4000 mg | ORAL_CAPSULE | Freq: Every day | ORAL | 1 refills | Status: DC
Start: 2019-11-17 — End: 2020-06-04

## 2019-11-17 MED ORDER — SILDENAFIL CITRATE 100 MG PO TABS: 100.0000 mg | ORAL_TABLET | Freq: Every day | ORAL | 3 refills | Status: DC | PRN

## 2019-11-17 MED ORDER — BUDESONIDE-FORMOTEROL FUMARATE 160-4.5 MCG/ACT IN AERO: 2.0000 | INHALATION_SPRAY | Freq: Two times a day (BID) | RESPIRATORY_TRACT | 3 refills | Status: DC

## 2019-11-17 NOTE — Progress Notes (Signed)
Shane Carrillo - 74 y.o. male MRN [id number]  Date of birth: March 23, 1945  Subjective Chief Complaint  Patient presents with  . Hoarse  . Wheezing    HPI Shane Carrillo is a 74 y.o. male with history of HTN, CAD, and asthma here today with complaint of wheezing and hoarseness.  He reports symptoms starred about 2 weeks ago.  He has had some mild shortness of breath and cough.  He did have COVID test which returned negative.  He denies fever, chills, body aches, nausea, vomiting, diarrhea.  He is out of albuterol and symbicort.  He does continue to smoke.  He has been unsuccessful quitting previously.    He also reports he has started to have frequent headaches.  He denies any problems with headaches prior to a few months ago.  He denies vision changes, dizziness, or other neurological changes.    ROS:  A comprehensive ROS was completed and negative except as noted per HPI  Allergies  Allergen Reactions  . Aspirin Other (See Comments)    GI upset - Pt states that he is able to take the coated form     Past Medical History:  Diagnosis Date  . Aortic ectasia, abdominal ([place]) 08/2016   On medicare screening u/s: Abdominal aortic atherosclerosis and mild ectasia. Maximal diameter 2.8 cm-->repeat u/s 5 yrs.  Shane Carrillo BPH with obstruction/lower urinary tract symptoms   . Chronic cough    upper airway cough syndrome/irritable larynx syndrome->Dr. Melvyn Novas.  Shane Carrillo COPD (chronic obstructive pulmonary disease) ([place])   . Coronary artery disease    Stent.    . DOE (dyspnea on exertion)    spirometry 02/2018 normal, but needs full PFT's per Dr. Melvyn Novas 03/2018.  No bronchodilators indicated as of 03/2018.  Shane Carrillo GERD (gastroesophageal reflux disease)    Dysphagia  . Hypercholesterolemia    Intol of atorva and simva  . Hypertension   . PAD (peripheral artery disease) ([facility])    Abd aortic athero  . Tobacco dependence   . Unintentional weight loss 2019   +Dysphagia,dec appetite/abd pain->pt was set to get  EGD/colonoscopy late 2019 but he never followed up with GI to get this.    Past Surgical History:  Procedure Laterality Date  . ABDOMINAL AORTOGRAM W/LOWER EXTREMITY N/A 02/24/2019   Procedure: ABDOMINAL AORTOGRAM W/LOWER EXTREMITY;  Surgeon: Lorretta Harp, MD;  Location: [facility];  Service: Cardiovascular;  Laterality: N/A;  . CORONARY ANGIOPLASTY WITH STENT PLACEMENT     Dr. Ouida Sills in [place], [place].  Shane Carrillo FOOT SURGERY    . PERIPHERAL VASCULAR INTERVENTION Left 02/24/2019   Procedure: PERIPHERAL VASCULAR INTERVENTION;  Surgeon: Lorretta Harp, MD;  Location: [facility];  Service: Cardiovascular;  Laterality: Left;  . ROTATOR CUFF REPAIR      Social History   Socioeconomic History  . Marital status: Divorced    Spouse name: Not on file  . Number of children: Not on file  . Years of education: Not on file  . Highest education level: Not on file  Occupational History  . Not on file  Tobacco Use  . Smoking status: Current Every Day Smoker    Packs/day: 1.00    Years: 55.00    Pack years: 55.00    Types: Cigarettes  . Smokeless tobacco: Never Used  Vaping Use  . Vaping Use: Never used  Substance and Sexual Activity  . Alcohol use: Yes    Comment: 4 days out of the week  .  Drug use: No  . Sexual activity: Not on file  Other Topics Concern  . Not on file  Social History Narrative   Married, 12 grown children.   Orig from [place], [place].   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   Tob: 55 pack-yr hx.  No alc, no drugs.   Social Determinants of Health   Financial Resource Strain:   . Difficulty of Paying Living Expenses: Not on file  Food Insecurity:   . Worried About Charity fundraiser in the Last Year: Not on file  . Ran Out of Food in the Last Year: Not on file  Transportation Needs:   . Lack of Transportation (Medical): Not on file  . Lack of Transportation (Non-Medical): Not on file  Physical Activity:   . Days of  Exercise per Week: Not on file  . Minutes of Exercise per Session: Not on file  Stress:   . Feeling of Stress : Not on file  Social Connections:   . Frequency of Communication with Friends and Family: Not on file  . Frequency of Social Gatherings with Friends and Family: Not on file  . Attends Religious Services: Not on file  . Active Member of Clubs or Organizations: Not on file  . Attends Archivist Meetings: Not on file  . Marital Status: Not on file    Family History  Problem Relation Age of Onset  . Cancer - Cervical Mother   . Colon cancer Mother   . Colon cancer Father   . Prostate cancer Father   . Diabetes Maternal Grandmother     Health Maintenance  Topic Date Due  . Hepatitis C Screening  Never done  . COVID-19 Vaccine (1) Never done  . Fecal DNA (Cologuard)  03/21/2019  . TETANUS/TDAP  01/07/2020  . INFLUENZA VACCINE  Discontinued  . PNA vac Low Risk Adult  Discontinued     ----------------------------------------------------------------------------------------------------------------------------------------------------------------------------------------------------------------- Physical Exam BP (!) 178/86 (BP Location: Right Arm, Patient Position: Sitting, Cuff Size: Small)   Pulse 75   Temp 98.3 F (36.8 C)   Wt 137 lb 4.8 oz (62.3 kg)   SpO2 96%   BMI 23.57 kg/m   Physical Exam Constitutional:      Appearance: Normal appearance.  HENT:     Head: Normocephalic and atraumatic.  Eyes:     General: No scleral icterus. Cardiovascular:     Rate and Rhythm: Normal rate and regular rhythm.  Pulmonary:     Effort: Pulmonary effort is normal.     Breath sounds: Wheezing present.  Skin:    General: Skin is warm and dry.  Neurological:     General: No focal deficit present.     Mental Status: He is alert.  Psychiatric:        Mood and Affect: Mood normal.        Behavior: Behavior normal.      ------------------------------------------------------------------------------------------------------------------------------------------------------------------------------------------------------------------- Assessment and Plan  Essential hypertension BP is not well controlled today.  He is currently out of lisinopril, rx renewed. Recommend low sodium diet and discontinuation of smoking.    Cigarette smoker Counseled on smoking cessation.   Referral placed for lung cancer screening.   Asthma exacerbation Recommend restarting symbicort daily and albuterol as needed.  Likely has COPD component as well given prolonged smoking history.   He did have negative COVID test recently.   Start prednisone 20mg  BID x5 days.      New onset of headaches New  onset of headaches concerning in his age group.   Will obtain MRI of brain.     Meds ordered this encounter  Medications  . albuterol (PROAIR HFA) 108 (90 Base) MCG/ACT inhaler    Sig: Inhale 2 puffs into the lungs every 6 (six) hours as needed for wheezing or shortness of breath.    Dispense:  8 g    Refill:  3  . budesonide-formoterol (SYMBICORT) 160-4.5 MCG/ACT inhaler    Sig: Inhale 2 puffs into the lungs in the morning and at bedtime. Take 1 puffs first thing in am and then another  1 puffs about 12 hours later.    Dispense:  1 each    Refill:  3    Order Specific Question:   Lot Number?    Answer:   [id number]    Order Specific Question:   Expiration Date?    Answer:   03/20/2019    Order Specific Question:   Manufacturer?    Answer:   [employer] [[id number]]    Order Specific Question:   Quantity    Answer:   1  . predniSONE (DELTASONE) 20 MG tablet    Sig: Take 1 tablet (20 mg total) by mouth 2 (two) times daily with a meal for 5 days.    Dispense:  10 tablet    Refill:  0  . lisinopril (ZESTRIL) 20 MG tablet    Sig: Take 1 tablet (20 mg total) by mouth daily.    Dispense:  90 tablet    Refill:  1  . sildenafil  (VIAGRA) 100 MG tablet    Sig: Take 1 tablet (100 mg total) by mouth daily as needed for erectile dysfunction.    Dispense:  10 tablet    Refill:  3  . tamsulosin (FLOMAX) 0.4 MG CAPS capsule    Sig: Take 1 capsule (0.4 mg total) by mouth daily after supper.    Dispense:  90 capsule    Refill:  1    No follow-ups on file.    This visit occurred during the SARS-CoV-2 public health emergency.  Safety protocols were in place, including screening questions prior to the visit, additional usage of staff PPE, and extensive cleaning of exam room while observing appropriate contact time as indicated for disinfecting solutions.

## 2019-11-17 NOTE — Telephone Encounter (Signed)
Opened in error

## 2019-11-17 NOTE — Patient Instructions (Signed)
Be sure to take BP medication when you get home.  Start prednisone and inhalers as directed.  Have xray completed.  I recommend that you quit smoking.

## 2019-11-18 ENCOUNTER — Other Ambulatory Visit: Payer: Self-pay

## 2019-11-18 ENCOUNTER — Ambulatory Visit (HOSPITAL_COMMUNITY)
Admission: RE | Admit: 2019-11-18 | Discharge: 2019-11-18 | Disposition: A | Discharge status: Home / Self Care | Payer: Medicare Other | Source: Ambulatory Visit | Attending: Cardiovascular Disease | Admitting: Cardiovascular Disease

## 2019-11-18 ENCOUNTER — Other Ambulatory Visit (HOSPITAL_COMMUNITY): Payer: Self-pay | Admitting: Cardiovascular Disease

## 2019-11-18 DIAGNOSIS — Z9889 Other specified postprocedural states: Secondary | ICD-10-CM

## 2019-11-18 DIAGNOSIS — I739 Peripheral vascular disease, unspecified: Secondary | ICD-10-CM

## 2019-11-20 DIAGNOSIS — J45901 Unspecified asthma with (acute) exacerbation: Secondary | ICD-10-CM | POA: Insufficient documentation

## 2019-11-20 DIAGNOSIS — R519 Headache, unspecified: Secondary | ICD-10-CM | POA: Insufficient documentation

## 2019-11-20 NOTE — Assessment & Plan Note (Signed)
BP is not well controlled today.  He is currently out of lisinopril, rx renewed. Recommend low sodium diet and discontinuation of smoking.

## 2019-11-20 NOTE — Assessment & Plan Note (Addendum)
Recommend restarting symbicort daily and albuterol as needed.  Likely has COPD component as well given prolonged smoking history.   He did have negative COVID test recently.   Start prednisone 20mg  BID x5 days.

## 2019-11-20 NOTE — Assessment & Plan Note (Signed)
New onset of headaches concerning in his age group.   Will obtain MRI of brain.

## 2019-11-20 NOTE — Assessment & Plan Note (Signed)
Counseled on smoking cessation.   Referral placed for lung cancer screening.

## 2019-11-21 ENCOUNTER — Telehealth: Payer: Self-pay

## 2019-11-21 NOTE — Telephone Encounter (Signed)
Patient notified of following results.   Lorretta Harp, MD  11/19/2019 4:40 PM EDT     Repeat 12 months. No signif PAD    Patient verbalized understanding.

## 2019-11-26 ENCOUNTER — Ambulatory Visit: Payer: Medicare Other

## 2019-11-26 ENCOUNTER — Other Ambulatory Visit: Payer: Self-pay

## 2019-11-26 ENCOUNTER — Ambulatory Visit (INDEPENDENT_AMBULATORY_CARE_PROVIDER_SITE_OTHER): Payer: Medicare Other

## 2019-11-26 DIAGNOSIS — R519 Headache, unspecified: Secondary | ICD-10-CM | POA: Diagnosis not present

## 2019-11-28 ENCOUNTER — Ambulatory Visit (INDEPENDENT_AMBULATORY_CARE_PROVIDER_SITE_OTHER): Payer: Medicare Other

## 2019-11-28 ENCOUNTER — Other Ambulatory Visit: Payer: Self-pay

## 2019-11-28 DIAGNOSIS — R062 Wheezing: Secondary | ICD-10-CM | POA: Diagnosis not present

## 2019-11-28 DIAGNOSIS — R059 Cough, unspecified: Secondary | ICD-10-CM

## 2019-11-28 DIAGNOSIS — R079 Chest pain, unspecified: Secondary | ICD-10-CM | POA: Diagnosis not present

## 2019-11-28 DIAGNOSIS — R9389 Abnormal findings on diagnostic imaging of other specified body structures: Secondary | ICD-10-CM

## 2019-11-28 DIAGNOSIS — R0600 Dyspnea, unspecified: Secondary | ICD-10-CM

## 2019-11-29 ENCOUNTER — Other Ambulatory Visit: Payer: Self-pay

## 2019-11-29 ENCOUNTER — Ambulatory Visit: Payer: Medicare Other | Admitting: Family Medicine

## 2019-11-29 DIAGNOSIS — I739 Peripheral vascular disease, unspecified: Secondary | ICD-10-CM

## 2019-12-01 ENCOUNTER — Encounter: Payer: Self-pay | Admitting: Family Medicine

## 2019-12-01 ENCOUNTER — Ambulatory Visit (INDEPENDENT_AMBULATORY_CARE_PROVIDER_SITE_OTHER): Payer: Medicare Other | Admitting: Family Medicine

## 2019-12-01 VITALS — BP 149/70 | HR 84 | Temp 98.5°F | Wt 141.0 lb

## 2019-12-01 DIAGNOSIS — J441 Chronic obstructive pulmonary disease with (acute) exacerbation: Secondary | ICD-10-CM | POA: Diagnosis not present

## 2019-12-01 DIAGNOSIS — N401 Enlarged prostate with lower urinary tract symptoms: Secondary | ICD-10-CM

## 2019-12-01 DIAGNOSIS — R339 Retention of urine, unspecified: Secondary | ICD-10-CM

## 2019-12-01 DIAGNOSIS — R350 Frequency of micturition: Secondary | ICD-10-CM | POA: Diagnosis not present

## 2019-12-01 MED ORDER — PREDNISONE 20 MG PO TABS: 20.0000 mg | ORAL_TABLET | Freq: Two times a day (BID) | ORAL | 0 refills | Status: DC

## 2019-12-01 MED ORDER — DOXYCYCLINE HYCLATE 100 MG PO TABS: 100.0000 mg | ORAL_TABLET | Freq: Two times a day (BID) | ORAL | 0 refills | Status: DC

## 2019-12-01 NOTE — Assessment & Plan Note (Signed)
Continued cough and wheezing.  Repeat course of prednisone and add doxycycline.  Recommend that he start symbicort daily.  Recommend quitting smoking as well.

## 2019-12-01 NOTE — Patient Instructions (Signed)
Use symbicort daily.  Use albuterol as needed.  Start doxycycline (antibiotic). Start prednisone (steroid) Have labs completed downstairs.

## 2019-12-01 NOTE — Assessment & Plan Note (Addendum)
Increased urinary retention Check PSA and urinalysis today.  Recommend taking tamsulosin regularly.

## 2019-12-01 NOTE — Progress Notes (Signed)
Shane Carrillo - 74 y.o. male MRN [id number]  Date of birth: 31-Oct-1945  Subjective Chief Complaint  Patient presents with  . Shortness of Breath  . Cough    HPI Shane Carrillo is a 74 y.o. male here today for follow up of cough.  He was seen about 2 weeks ago and treated with prednisone.  He had some brief improvement but continues to have cough with wheezing and mild dyspnea.  Cough is productive of light yellow sputum.  He has not restarted symbicort.  He is using albuterol as needed.  He has not had fever or chills.  His CXR did not show any acute changes.    He also reports some increased difficulty with urination and needing to strain more to urinate.  Denies dysuria or frequency.  He has flomax but is not taking regularly.    ROS:  A comprehensive ROS was completed and negative except as noted per HPI  Allergies  Allergen Reactions  . Aspirin Other (See Comments)    GI upset - Pt states that he is able to take the coated form     Past Medical History:  Diagnosis Date  . Aortic ectasia, abdominal ([place]) 08/2016   On medicare screening u/s: Abdominal aortic atherosclerosis and mild ectasia. Maximal diameter 2.8 cm-->repeat u/s 5 yrs.  [name] BPH with obstruction/lower urinary tract symptoms   . Chronic cough    upper airway cough syndrome/irritable larynx syndrome->Dr. Melvyn Novas.  [name] COPD (chronic obstructive pulmonary disease) ([place])   . Coronary artery disease    Stent.    . DOE (dyspnea on exertion)    spirometry 02/2018 normal, but needs full PFT's per Dr. Melvyn Novas 03/2018.  No bronchodilators indicated as of 03/2018.  [name] GERD (gastroesophageal reflux disease)    Dysphagia  . Hypercholesterolemia    Intol of atorva and simva  . Hypertension   . PAD (peripheral artery disease) ([facility])    Abd aortic athero  . Tobacco dependence   . Unintentional weight loss 2019   +Dysphagia,dec appetite/abd pain->pt was set to get EGD/colonoscopy late 2019 but he never followed up with GI to get  this.    Past Surgical History:  Procedure Laterality Date  . ABDOMINAL AORTOGRAM W/LOWER EXTREMITY N/A 02/24/2019   Procedure: ABDOMINAL AORTOGRAM W/LOWER EXTREMITY;  Surgeon: Lorretta Harp, MD;  Location: [facility];  Service: Cardiovascular;  Laterality: N/A;  . CORONARY ANGIOPLASTY WITH STENT PLACEMENT     Dr. Ouida Sills in [place], [place].  [name] FOOT SURGERY    . PERIPHERAL VASCULAR INTERVENTION Left 02/24/2019   Procedure: PERIPHERAL VASCULAR INTERVENTION;  Surgeon: Lorretta Harp, MD;  Location: [facility];  Service: Cardiovascular;  Laterality: Left;  . ROTATOR CUFF REPAIR      Social History   Socioeconomic History  . Marital status: Divorced    Spouse name: Not on file  . Number of children: Not on file  . Years of education: Not on file  . Highest education level: Not on file  Occupational History  . Not on file  Tobacco Use  . Smoking status: Current Every Day Smoker    Packs/day: 1.00    Years: 55.00    Pack years: 55.00    Types: Cigarettes  . Smokeless tobacco: Never Used  Vaping Use  . Vaping Use: Never used  Substance and Sexual Activity  . Alcohol use: Yes    Comment: 4 days out of the week  . Drug use: No  .  Sexual activity: Not on file  Other Topics Concern  . Not on file  Social History Narrative   Married, 12 grown children.   Orig from [place], [place].   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   Tob: 55 pack-yr hx.  No alc, no drugs.   Social Determinants of Health   Financial Resource Strain:   . Difficulty of Paying Living Expenses: Not on file  Food Insecurity:   . Worried About Charity fundraiser in the Last Year: Not on file  . Ran Out of Food in the Last Year: Not on file  Transportation Needs:   . Lack of Transportation (Medical): Not on file  . Lack of Transportation (Non-Medical): Not on file  Physical Activity:   . Days of Exercise per Week: Not on file  . Minutes of Exercise per Session:  Not on file  Stress:   . Feeling of Stress : Not on file  Social Connections:   . Frequency of Communication with Friends and Family: Not on file  . Frequency of Social Gatherings with Friends and Family: Not on file  . Attends Religious Services: Not on file  . Active Member of Clubs or Organizations: Not on file  . Attends Archivist Meetings: Not on file  . Marital Status: Not on file    Family History  Problem Relation Age of Onset  . Cancer - Cervical Mother   . Colon cancer Mother   . Colon cancer Father   . Prostate cancer Father   . Diabetes Maternal Grandmother     Health Maintenance  Topic Date Due  . Hepatitis C Screening  Never done  . COVID-19 Vaccine (1) Never done  . Fecal DNA (Cologuard)  03/21/2019  . TETANUS/TDAP  01/07/2020  . INFLUENZA VACCINE  Discontinued  . PNA vac Low Risk Adult  Discontinued     ----------------------------------------------------------------------------------------------------------------------------------------------------------------------------------------------------------------- Physical Exam BP (!) 149/70 (BP Location: Left Arm, Patient Position: Sitting, Cuff Size: Normal)   Pulse 84   Temp 98.5 F (36.9 C)   Wt 141 lb (64 kg)   SpO2 98%   BMI 24.20 kg/m   Physical Exam Constitutional:      Appearance: He is well-developed.  Eyes:     General: No scleral icterus. Cardiovascular:     Rate and Rhythm: Normal rate and regular rhythm.  Pulmonary:     Effort: Pulmonary effort is normal.     Breath sounds: Normal breath sounds.  Musculoskeletal:     Cervical back: Neck supple.  Neurological:     General: No focal deficit present.     Mental Status: He is alert.  Psychiatric:        Mood and Affect: Mood normal.        Behavior: Behavior normal.      ------------------------------------------------------------------------------------------------------------------------------------------------------------------------------------------------------------------- Assessment and Plan  COPD exacerbation ([facility]) Continued cough and wheezing.  Repeat course of prednisone and add doxycycline.  Recommend that he start symbicort daily.  Recommend quitting smoking as well.   Benign prostatic hyperplasia with urinary frequency Increased urinary retention Check PSA and urinalysis today.  Recommend taking tamsulosin regularly.     Meds ordered this encounter  Medications  . doxycycline (VIBRA-TABS) 100 MG tablet    Sig: Take 1 tablet (100 mg total) by mouth 2 (two) times daily.    Dispense:  20 tablet    Refill:  0  . predniSONE (DELTASONE) 20 MG tablet    Sig: Take 1  tablet (20 mg total) by mouth 2 (two) times daily with a meal for 5 days.    Dispense:  10 tablet    Refill:  0    No follow-ups on file.    This visit occurred during the SARS-CoV-2 public health emergency.  Safety protocols were in place, including screening questions prior to the visit, additional usage of staff PPE, and extensive cleaning of exam room while observing appropriate contact time as indicated for disinfecting solutions.

## 2019-12-02 LAB — URINALYSIS, ROUTINE W REFLEX MICROSCOPIC
Bilirubin Urine: NEGATIVE
Hgb urine dipstick: NEGATIVE
Ketones, ur: NEGATIVE
Leukocytes,Ua: NEGATIVE
Nitrite: NEGATIVE
Protein, ur: NEGATIVE
Specific Gravity, Urine: 1.011 (ref 1.001–1.03)
pH: 5.5 (ref 5.0–8.0)

## 2019-12-02 LAB — PSA: PSA: 2.49 ng/mL (ref <=4.0)

## 2019-12-15 ENCOUNTER — Ambulatory Visit: Payer: Medicare Other | Admitting: Family Medicine

## 2019-12-16 ENCOUNTER — Ambulatory Visit (INDEPENDENT_AMBULATORY_CARE_PROVIDER_SITE_OTHER): Payer: Medicare Other | Admitting: Family Medicine

## 2019-12-16 ENCOUNTER — Encounter: Payer: Self-pay | Admitting: Family Medicine

## 2019-12-16 ENCOUNTER — Encounter (HOSPITAL_COMMUNITY): Payer: Medicare Other

## 2019-12-16 DIAGNOSIS — I10 Essential (primary) hypertension: Secondary | ICD-10-CM | POA: Diagnosis not present

## 2019-12-16 DIAGNOSIS — J449 Chronic obstructive pulmonary disease, unspecified: Secondary | ICD-10-CM | POA: Insufficient documentation

## 2019-12-16 DIAGNOSIS — J410 Simple chronic bronchitis: Secondary | ICD-10-CM

## 2019-12-16 MED ORDER — ROSUVASTATIN CALCIUM 40 MG PO TABS: 40.0000 mg | ORAL_TABLET | Freq: Every day | ORAL | 3 refills | Status: DC

## 2019-12-16 MED ORDER — BREZTRI AEROSPHERE 160-9-4.8 MCG/ACT IN AERO: INHALATION_SPRAY | RESPIRATORY_TRACT | 3 refills | Status: DC

## 2019-12-16 MED ORDER — LOSARTAN POTASSIUM 50 MG PO TABS: 50.0000 mg | ORAL_TABLET | Freq: Every day | ORAL | 3 refills | Status: DC

## 2019-12-16 MED ORDER — AMLODIPINE BESYLATE 10 MG PO TABS: 10.0000 mg | ORAL_TABLET | Freq: Every day | ORAL | 3 refills | Status: DC

## 2019-12-16 NOTE — Assessment & Plan Note (Addendum)
Discussed difference between maintenance inhalers and rescue inhaler. He expresses understanding.  Will change symbicort to breztri.  Given application for [place] and [place] program if he has trouble with affording this.   Continue albuterol as needed for acute episodes of wheezing, cough, shortness of breath.  Counseled on smoking cessation.  He has been referred to lung cancer screening.

## 2019-12-16 NOTE — Patient Instructions (Signed)
STOP TAKING:  Lisinopril, symbicort. START TAKING: Breztri (COPD, replaces symbicort) and losartan (blood pressure, replaces lisinopril)  [place] Breztri inhaler.   Be sure you are taking amlodipine (blood pressure) and rosuvastatin (cholesterol) Continue albuterol inhaler for shortness of breath or cough.

## 2019-12-16 NOTE — Assessment & Plan Note (Signed)
BP elevated today.  Taking lisinopril but not amlodipine.  Amlodipine renewed.  Will change lisinopril to losartan as he has chronic cough, but I think this is likely related to smoking/COPD.

## 2019-12-16 NOTE — Progress Notes (Signed)
CHARLE Carrillo - 74 y.o. male MRN [id number]  Date of birth: October 19, 1945  Subjective Chief Complaint  Patient presents with  . Follow-up    HPI Shane Carrillo is a 74 y.o. male here today for follow up of COPD exacerbation.  Recently completed course of doxycycline and prednisone.  Reports that he is breathing better but still has cough.  He has symbicort and albuterol.  He is only using the symbicort as more of a rescue type inhaler.  He continues to smoke about 1/2 ppd.    BP elevated today as well.  He is taking lisinopril but not amlodipine.  He denies symptoms related to HTN including headache, chest pain, dizziness.   ROS:  A comprehensive ROS was completed and negative except as noted per HPI  Allergies  Allergen Reactions  . Aspirin Other (See Comments)    GI upset - Pt states that he is able to take the coated form     Past Medical History:  Diagnosis Date  . Aortic ectasia, abdominal ([facility]) 08/2016   On medicare screening u/s: Abdominal aortic atherosclerosis and mild ectasia. Maximal diameter 2.8 cm-->repeat u/s 5 yrs.  Shane Carrillo BPH with obstruction/lower urinary tract symptoms   . Chronic cough    upper airway cough syndrome/irritable larynx syndrome->Dr. Melvyn Novas.  Shane Carrillo COPD (chronic obstructive pulmonary disease) ([facility])   . Coronary artery disease    Stent.    . DOE (dyspnea on exertion)    spirometry 02/2018 normal, but needs full PFT's per Dr. Melvyn Novas 03/2018.  No bronchodilators indicated as of 03/2018.  Shane Carrillo GERD (gastroesophageal reflux disease)    Dysphagia  . Hypercholesterolemia    Intol of atorva and simva  . Hypertension   . PAD (peripheral artery disease) ([facility])    Abd aortic athero  . Tobacco dependence   . Unintentional weight loss 2019   +Dysphagia,dec appetite/abd pain->pt was set to get EGD/colonoscopy late 2019 but he never followed up with GI to get this.    Past Surgical History:  Procedure Laterality Date  . ABDOMINAL AORTOGRAM W/LOWER EXTREMITY N/A 02/24/2019    Procedure: ABDOMINAL AORTOGRAM W/LOWER EXTREMITY;  Surgeon: Lorretta Harp, MD;  Location: [facility];  Service: Cardiovascular;  Laterality: N/A;  . CORONARY ANGIOPLASTY WITH STENT PLACEMENT     Dr. Ouida Sills in [place], [place].  Shane Carrillo FOOT SURGERY    . PERIPHERAL VASCULAR INTERVENTION Left 02/24/2019   Procedure: PERIPHERAL VASCULAR INTERVENTION;  Surgeon: Lorretta Harp, MD;  Location: [facility];  Service: Cardiovascular;  Laterality: Left;  . ROTATOR CUFF REPAIR      Social History   Socioeconomic History  . Marital status: Divorced    Spouse name: Not on file  . Number of children: Not on file  . Years of education: Not on file  . Highest education level: Not on file  Occupational History  . Not on file  Tobacco Use  . Smoking status: Current Every Day Smoker    Packs/day: 1.00    Years: 55.00    Pack years: 55.00    Types: Cigarettes  . Smokeless tobacco: Never Used  Vaping Use  . Vaping Use: Never used  Substance and Sexual Activity  . Alcohol use: Yes    Comment: 4 days out of the week  . Drug use: No  . Sexual activity: Not on file  Other Topics Concern  . Not on file  Social History Narrative   Married, 12 grown children.   Orig  from [place], [place].   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   Tob: 55 pack-yr hx.  No alc, no drugs.   Social Determinants of Health   Financial Resource Strain:   . Difficulty of Paying Living Expenses: Not on file  Food Insecurity:   . Worried About Charity fundraiser in the Last Year: Not on file  . Ran Out of Food in the Last Year: Not on file  Transportation Needs:   . Lack of Transportation (Medical): Not on file  . Lack of Transportation (Non-Medical): Not on file  Physical Activity:   . Days of Exercise per Week: Not on file  . Minutes of Exercise per Session: Not on file  Stress:   . Feeling of Stress : Not on file  Social Connections:   . Frequency of Communication with  Friends and Family: Not on file  . Frequency of Social Gatherings with Friends and Family: Not on file  . Attends Religious Services: Not on file  . Active Member of Clubs or Organizations: Not on file  . Attends Archivist Meetings: Not on file  . Marital Status: Not on file    Family History  Problem Relation Age of Onset  . Cancer - Cervical Mother   . Colon cancer Mother   . Colon cancer Father   . Prostate cancer Father   . Diabetes Maternal Grandmother     Health Maintenance  Topic Date Due  . Hepatitis C Screening  Never done  . COVID-19 Vaccine (1) Never done  . Fecal DNA (Cologuard)  03/21/2019  . TETANUS/TDAP  01/07/2020  . INFLUENZA VACCINE  Discontinued  . PNA vac Low Risk Adult  Discontinued     ----------------------------------------------------------------------------------------------------------------------------------------------------------------------------------------------------------------- Physical Exam BP (!) 168/78 (BP Location: Left Arm, Patient Position: Sitting, Cuff Size: Normal)   Pulse 65   Wt 141 lb 1.6 oz (64 kg)   SpO2 96%   BMI 24.22 kg/m   Physical Exam Constitutional:      Appearance: Normal appearance.  HENT:     Head: Normocephalic and atraumatic.  Cardiovascular:     Rate and Rhythm: Normal rate and regular rhythm.  Pulmonary:     Effort: Pulmonary effort is normal.     Breath sounds: Normal breath sounds.  Musculoskeletal:     Right lower leg: No edema.     Left lower leg: No edema.  Neurological:     General: No focal deficit present.     Mental Status: He is alert.  Psychiatric:        Mood and Affect: Mood normal.        Behavior: Behavior normal.     ------------------------------------------------------------------------------------------------------------------------------------------------------------------------------------------------------------------- Assessment and Plan  COPD (chronic  obstructive pulmonary disease) ([facility]) Discussed difference between maintenance inhalers and rescue inhaler. He expresses understanding.  Will change symbicort to breztri.  Given application for [place] if he has trouble with affording this.   Continue albuterol as needed for acute episodes of wheezing, cough, shortness of breath.  Counseled on smoking cessation.  He has been referred to lung cancer screening.   Essential hypertension BP elevated today.  Taking lisinopril but not amlodipine.  Amlodipine renewed.  Will change lisinopril to losartan as he has chronic cough, but I think this is likely related to smoking/COPD.     Meds ordered this encounter  Medications  . Budeson-Glycopyrrol-Formoterol (BREZTRI AEROSPHERE) 160-9-4.8 MCG/ACT AERO    Sig: Take 2 inhalations BID  Dispense:  5.9 g    Refill:  3  . rosuvastatin (CRESTOR) 40 MG tablet    Sig: Take 1 tablet (40 mg total) by mouth daily.    Dispense:  90 tablet    Refill:  3    Prior Auth approved from 12/20/18 - 01/19/20  . amLODipine (NORVASC) 10 MG tablet    Sig: Take 1 tablet (10 mg total) by mouth daily.    Dispense:  180 tablet    Refill:  3  . losartan (COZAAR) 50 MG tablet    Sig: Take 1 tablet (50 mg total) by mouth daily.    Dispense:  90 tablet    Refill:  3    Return in about 8 weeks (around 02/10/2020) for HTN/ COPD.    This visit occurred during the SARS-CoV-2 public health emergency.  Safety protocols were in place, including screening questions prior to the visit, additional usage of staff PPE, and extensive cleaning of exam room while observing appropriate contact time as indicated for disinfecting solutions.

## 2020-01-02 ENCOUNTER — Ambulatory Visit (INDEPENDENT_AMBULATORY_CARE_PROVIDER_SITE_OTHER): Payer: Medicare Other | Admitting: Family Medicine

## 2020-01-02 ENCOUNTER — Encounter: Payer: Self-pay | Admitting: Family Medicine

## 2020-01-02 ENCOUNTER — Ambulatory Visit (INDEPENDENT_AMBULATORY_CARE_PROVIDER_SITE_OTHER): Payer: Medicare Other

## 2020-01-02 ENCOUNTER — Other Ambulatory Visit: Payer: Self-pay

## 2020-01-02 VITALS — BP 177/87 | HR 91

## 2020-01-02 DIAGNOSIS — S0990XA Unspecified injury of head, initial encounter: Secondary | ICD-10-CM

## 2020-01-02 DIAGNOSIS — W19XXXA Unspecified fall, initial encounter: Secondary | ICD-10-CM

## 2020-01-02 DIAGNOSIS — W109XXA Fall (on) (from) unspecified stairs and steps, initial encounter: Secondary | ICD-10-CM | POA: Diagnosis not present

## 2020-01-02 DIAGNOSIS — M25561 Pain in right knee: Secondary | ICD-10-CM

## 2020-01-02 HISTORY — DX: Pain in right knee: M25.561

## 2020-01-02 NOTE — Progress Notes (Signed)
LOWRY BALA - 74 y.o. male MRN [id number]  Date of birth: 1945/09/30  Subjective Chief Complaint  Patient presents with  . Fall    HPI BANNON GIAMMARCO is a 74 y.o. male here today with complaint of fall. He states that he slipped while walking down the stairs last night and fell twisting his knee and hitting his head.  He has had upper neck pain and dull headache.  He has mild dizziness as well.  He denies vision change, nausea, weakness, numbness or tingling.    He has not had any swelling of the knee. Denies locking sensation.    ROS:  A comprehensive ROS was completed and negative except as noted per HPI  Allergies  Allergen Reactions  . Aspirin Other (See Comments)    GI upset - Pt states that he is able to take the coated form     Past Medical History:  Diagnosis Date  . Aortic ectasia, abdominal ([facility]) 08/2016   On medicare screening u/s: Abdominal aortic atherosclerosis and mild ectasia. Maximal diameter 2.8 cm-->repeat u/s 5 yrs.  [name] BPH with obstruction/lower urinary tract symptoms   . Chronic cough    upper airway cough syndrome/irritable larynx syndrome->Dr. Melvyn Novas.  [name] COPD (chronic obstructive pulmonary disease) ([facility])   . Coronary artery disease    Stent.    . DOE (dyspnea on exertion)    spirometry 02/2018 normal, but needs full PFT's per Dr. Melvyn Novas 03/2018.  No bronchodilators indicated as of 03/2018.  [name] GERD (gastroesophageal reflux disease)    Dysphagia  . Hypercholesterolemia    Intol of atorva and simva  . Hypertension   . PAD (peripheral artery disease) (H[facility])    Abd aortic athero  . Tobacco dependence   . Unintentional weight loss 2019   +Dysphagia,dec appetite/abd pain->pt was set to get EGD/colonoscopy late 2019 but he never followed up with GI to get this.    Past Surgical History:  Procedure Laterality Date  . ABDOMINAL AORTOGRAM W/LOWER EXTREMITY N/A 02/24/2019   Procedure: ABDOMINAL AORTOGRAM W/LOWER EXTREMITY;  Surgeon: Lorretta Harp, MD;   Location: [facility];  Service: Cardiovascular;  Laterality: N/A;  . CORONARY ANGIOPLASTY WITH STENT PLACEMENT     Dr. Ouida Sills in [place], [place].  [name] FOOT SURGERY    . PERIPHERAL VASCULAR INTERVENTION Left 02/24/2019   Procedure: PERIPHERAL VASCULAR INTERVENTION;  Surgeon: Lorretta Harp, MD;  Location: [facility];  Service: Cardiovascular;  Laterality: Left;  . ROTATOR CUFF REPAIR      Social History   Socioeconomic History  . Marital status: Divorced    Spouse name: Not on file  . Number of children: Not on file  . Years of education: Not on file  . Highest education level: Not on file  Occupational History  . Not on file  Tobacco Use  . Smoking status: Current Every Day Smoker    Packs/day: 1.00    Years: 55.00    Pack years: 55.00    Types: Cigarettes  . Smokeless tobacco: Never Used  Vaping Use  . Vaping Use: Never used  Substance and Sexual Activity  . Alcohol use: Yes    Comment: 4 days out of the week  . Drug use: No  . Sexual activity: Not on file  Other Topics Concern  . Not on file  Social History Narrative   Married, 12 grown children.   Orig from [place], [place].   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   Tob: 55 pack-yr hx.  No alc, no drugs.   Social Determinants of Health   Financial Resource Strain:   . Difficulty of Paying Living Expenses: Not on file  Food Insecurity:   . Worried About Charity fundraiser in the Last Year: Not on file  . Ran Out of Food in the Last Year: Not on file  Transportation Needs:   . Lack of Transportation (Medical): Not on file  . Lack of Transportation (Non-Medical): Not on file  Physical Activity:   . Days of Exercise per Week: Not on file  . Minutes of Exercise per Session: Not on file  Stress:   . Feeling of Stress : Not on file  Social Connections:   . Frequency of Communication with Friends and Family: Not on file  . Frequency of Social Gatherings with Friends and  Family: Not on file  . Attends Religious Services: Not on file  . Active Member of Clubs or Organizations: Not on file  . Attends Archivist Meetings: Not on file  . Marital Status: Not on file    Family History  Problem Relation Age of Onset  . Cancer - Cervical Mother   . Colon cancer Mother   . Colon cancer Father   . Prostate cancer Father   . Diabetes Maternal Grandmother     Health Maintenance  Topic Date Due  . Hepatitis C Screening  Never done  . COVID-19 Vaccine (1) Never done  . Fecal DNA (Cologuard)  03/21/2019  . TETANUS/TDAP  01/07/2020  . INFLUENZA VACCINE  Discontinued  . PNA vac Low Risk Adult  Discontinued     ----------------------------------------------------------------------------------------------------------------------------------------------------------------------------------------------------------------- Physical Exam BP (!) 177/87 (BP Location: Left Arm, Patient Position: Sitting, Cuff Size: Normal)   Pulse 91   SpO2 100%   Physical Exam Constitutional:      Appearance: Normal appearance.  Eyes:     General: No scleral icterus. Cardiovascular:     Rate and Rhythm: Normal rate and regular rhythm.  Pulmonary:     Effort: Pulmonary effort is normal.     Breath sounds: Normal breath sounds.  Musculoskeletal:     Cervical back: Neck supple.  Skin:    Comments: R knee normal to inspection and palpation.  No effusion noted.  Mild pain with meniscal provocation testing without locking or catch.   Ligaments without significant laxity.    Neurological:     General: No focal deficit present.     Mental Status: He is alert and oriented to person, place, and time.     Cranial Nerves: No cranial nerve deficit.     Motor: No weakness.     Coordination: Coordination normal.     Gait: Gait abnormal (antalgic).  Psychiatric:        Mood and Affect: Mood normal.        Behavior: Behavior normal.      ------------------------------------------------------------------------------------------------------------------------------------------------------------------------------------------------------------------- Assessment and Plan  Head injury Neuro exam is non-focal.  TTP along posterior scalp.  Stat non-contrast CT of head ordered.   Acute pain of right knee Xrays of R knee ordered.    No orders of the defined types were placed in this encounter.   No follow-ups on file.    This visit occurred during the SARS-CoV-2 public health emergency.  Safety protocols were in place, including screening questions prior to the visit, additional usage of staff PPE, and extensive cleaning of exam room while observing appropriate contact time as indicated  for disinfecting solutions.

## 2020-01-02 NOTE — Assessment & Plan Note (Signed)
Xrays of R knee ordered.

## 2020-01-02 NOTE — Patient Instructions (Signed)
Have imaging completed downstairs.   We'll be in touch with result.

## 2020-01-02 NOTE — Assessment & Plan Note (Signed)
Neuro exam is non-focal.  TTP along posterior scalp.  Stat non-contrast CT of head ordered.

## 2020-01-05 ENCOUNTER — Ambulatory Visit: Payer: Medicare Other | Admitting: Family Medicine

## 2020-02-27 ENCOUNTER — Ambulatory Visit: Payer: Medicare Other | Admitting: Family Medicine

## 2020-02-29 ENCOUNTER — Ambulatory Visit: Payer: Medicare HMO | Admitting: Family Medicine

## 2020-02-29 ENCOUNTER — Other Ambulatory Visit: Payer: Self-pay

## 2020-02-29 ENCOUNTER — Encounter: Payer: Self-pay | Admitting: Cardiovascular Disease

## 2020-02-29 ENCOUNTER — Ambulatory Visit (INDEPENDENT_AMBULATORY_CARE_PROVIDER_SITE_OTHER): Payer: Medicare HMO | Admitting: Cardiovascular Disease

## 2020-02-29 VITALS — BP 152/84 | HR 82 | Ht 65.0 in | Wt 136.8 lb

## 2020-02-29 DIAGNOSIS — I739 Peripheral vascular disease, unspecified: Secondary | ICD-10-CM

## 2020-02-29 DIAGNOSIS — E782 Mixed hyperlipidemia: Secondary | ICD-10-CM | POA: Diagnosis not present

## 2020-02-29 DIAGNOSIS — I25709 Atherosclerosis of coronary artery bypass graft(s), unspecified, with unspecified angina pectoris: Secondary | ICD-10-CM | POA: Diagnosis not present

## 2020-02-29 NOTE — Assessment & Plan Note (Signed)
Mr. Shane Carrillo is a patient of Dr. Kathalene Frames who was referred to me because of [place] limiting claudication.  I performed peripheral angiography and intervention on him 02/24/2019 with directional atherectomy, DCB of his mid and distal left SFA.  He had two-vessel runoff with disease in both the anterior tibial and peroneal arteries.  He did have mild to moderate iliac disease that did not appear to be hemodynamically significant and fairly symmetric right SFA and popliteal disease as well.  At this point he does not wish to pursue percutaneous intervention but may in the future.  We will get Dopplers on him in October and I will see him back in 12 months.

## 2020-02-29 NOTE — Progress Notes (Signed)
3

## 2020-02-29 NOTE — Addendum Note (Signed)
Addended by: Beatrix Fetters on: 02/29/2020 10:24 AM   Modules accepted: Orders

## 2020-02-29 NOTE — Patient Instructions (Signed)
Medication Instructions:  Your physician recommends that you continue on your current medications as directed. Please refer to the Current Medication list given to you today.  *If you need a refill on your cardiac medications before your next appointment, please call your pharmacy*   Testing/Procedures: Your physician has requested that you have a lower extremity arterial duplex. This test is an ultrasound of the arteries in the legs. It looks at arterial blood flow in the legs and arms. Allow one hour for Lower Arterial scans. There are no restrictions or special instructions. [place]. [address] To be done in Oct. 2022.    Follow-Up: At [facility], you and your health needs are our priority.  As part of our continuing mission to provide you with exceptional heart care, we have created designated Provider Care Teams.  These Care Teams include your primary Cardiologist (physician) and Advanced Practice Providers (APPs -  Physician Assistants and Nurse Practitioners) who all work together to provide you with the care you need, when you need it.  We recommend signing up for the patient portal called "MyChart".  Sign up information is provided on this After Visit Summary.  MyChart is used to connect with patients for Virtual Visits (Telemedicine).  Patients are able to view lab/test results, encounter notes, upcoming appointments, etc.  Non-urgent messages can be sent to your provider as well.   To learn more about what you can do with MyChart, go to [email or web address].    Your next appointment:   12 month(s)  The format for your next appointment:   In Person  Provider:   Quay Burow, MD

## 2020-02-29 NOTE — Progress Notes (Signed)
02/29/2020 Shane Carrillo   10-18-1945  [id number]  Primary Physician [name], DO Primary Cardiologist: Lorretta Harp MD [name], [place]  HPI:  Shane Carrillo is a 75 y.o.  thin appearing married African-American male father of 37 children, grandfather of 90 grandchildren referred by Dr. Davina Poke for peripheral vascular valuation because of lifestyle limiting claudication.  I last saw him in the office 03/11/2019.He is retired from working in the [employer] in the [employer] in [place]. His risk factors include 60-pack-year tobacco abuse currently smoking a pack a day, treated hypertension hyperlipidemia. He is never had a heart attack or stroke. He has had bypass surgery in 2008. He recently saw Dr. Davina Poke for preoperative clearance before hemorrhoid surgery by Dr. Johney Maine and had a low risk Myoview normal 2D echocardiogram. He has had bilateral calf claudication for 6 months which is symmetric and lifestyle limiting. Recent Dopplers performed 01/19/2019 revealed a right ABI of 0.97 and a left ABI of 0.92 with bilateral popliteal stenoses.  I performed peripheral angiography on him 02/24/2019 revealing bilateral SFA and popliteal stenoses.  He had moderate iliac disease but this did not appear to be physiologically significant on interrogation intraoperatively.  I performed directional atherectomy followed by drug-coated balloon angioplasty of the left SFA and popliteal artery.  His left ABI did increase from 0.48 up to 0.87.  His claudication on that side has resolved.    Since I saw him a year ago he continues to do well.  He does have mild right calf claudication.  Dopplers performed 11/18/2019 revealed a right ABI of 0.89 and a left of 1.0 with a widely patent left SFA.  At this point, he wishes to delay right SFA intervention.  Current Meds  Medication Sig  . albuterol (PROAIR HFA) 108 (90 Base) MCG/ACT inhaler Inhale 2 puffs into the lungs every 6 (six) hours  as needed for wheezing or shortness of breath.  [name] amLODipine (NORVASC) 10 MG tablet Take 1 tablet (10 mg total) by mouth daily.  [name] aspirin EC 325 MG tablet Take 325 mg by mouth daily.  . Budeson-Glycopyrrol-Formoterol (BREZTRI AEROSPHERE) 160-9-4.8 MCG/ACT AERO Take 2 inhalations BID  . buPROPion (WELLBUTRIN SR) 100 MG 12 hr tablet Take 1 tablet (100 mg total) by mouth daily.  [name] docusate sodium (COLACE) 100 MG capsule Take 1 capsule (100 mg total) by mouth 2 (two) times daily. (Patient taking differently: Take 200 mg by mouth every other day.)  . doxycycline (VIBRA-TABS) 100 MG tablet Take 1 tablet (100 mg total) by mouth 2 (two) times daily.  . famotidine (PEPCID) 40 MG tablet Take 40 mg by mouth daily as needed for heartburn or indigestion.   . fluticasone (FLONASE) 50 MCG/ACT nasal spray Place 2 sprays into both nostrils daily.  [name] lidocaine (XYLOCAINE) 2 % jelly Apply topically tid (plz sched with new provider for future fills) (Patient taking differently: Apply 1 application topically 3 (three) times daily.)  . losartan (COZAAR) 50 MG tablet Take 1 tablet (50 mg total) by mouth daily.  . Omega-3 Fatty Acids (FISH OIL) 1000 MG CAPS Take 1 g by mouth daily.  . potassium chloride (K-DUR) 10 MEQ tablet Take 1 tablet (10 mEq total) by mouth daily. (Patient taking differently: Take 10 mEq by mouth See admin instructions. Three times a week)  . rosuvastatin (CRESTOR) 40 MG tablet Take 1 tablet (40 mg total) by mouth daily.  . sildenafil (VIAGRA) 100 MG tablet Take 1  tablet (100 mg total) by mouth daily as needed for erectile dysfunction.  . tamsulosin (FLOMAX) 0.4 MG CAPS capsule Take 1 capsule (0.4 mg total) by mouth daily after supper.     Allergies  Allergen Reactions  . Aspirin Other (See Comments)    GI upset - Pt states that he is able to take the coated form     Social History   Socioeconomic History  . Marital status: Divorced    Spouse name: Not on file  . Number of children: Not  on file  . Years of education: Not on file  . Highest education level: Not on file  Occupational History  . Not on file  Tobacco Use  . Smoking status: Current Every Day Smoker    Packs/day: 1.00    Years: 55.00    Pack years: 55.00    Types: Cigarettes  . Smokeless tobacco: Never Used  Vaping Use  . Vaping Use: Never used  Substance and Sexual Activity  . Alcohol use: Yes    Comment: 4 days out of the week  . Drug use: No  . Sexual activity: Not on file  Other Topics Concern  . Not on file  Social History Narrative   Married, 12 grown children.   Orig from [place], [place].   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   Tob: 55 pack-yr hx.  No alc, no drugs.   Social Determinants of Health   Financial Resource Strain: Not on file  Food Insecurity: Not on file  Transportation Needs: Not on file  Physical Activity: Not on file  Stress: Not on file  Social Connections: Not on file  Intimate Partner Violence: Not on file     Review of Systems: General: negative for chills, fever, night sweats or weight changes.  Cardiovascular: negative for chest pain, dyspnea on exertion, edema, orthopnea, palpitations, paroxysmal nocturnal dyspnea or shortness of breath Dermatological: negative for rash Respiratory: negative for cough or wheezing Urologic: negative for hematuria Abdominal: negative for nausea, vomiting, diarrhea, bright red blood per rectum, melena, or hematemesis Neurologic: negative for visual changes, syncope, or dizziness All other systems reviewed and are otherwise negative except as noted above.    Blood pressure (!) 152/84, pulse 82, height 5\' 5"  (1.651 m), weight 136 lb 12.8 oz (62.1 kg).  General appearance: alert and no distress Neck: no adenopathy, no carotid bruit, no JVD, supple, symmetrical, trachea midline and thyroid not enlarged, symmetric, no tenderness/mass/nodules Lungs: clear to auscultation bilaterally Heart: regular rate  and rhythm, S1, S2 normal, no murmur, click, rub or gallop Extremities: extremities normal, atraumatic, no cyanosis or edema Pulses: 2+ and symmetric Skin: Skin color, texture, turgor normal. No rashes or lesions Neurologic: Alert and oriented X 3, normal strength and tone. Normal symmetric reflexes. Normal coordination and gait  EKG not performed today  ASSESSMENT AND PLAN:   Peripheral arterial disease [facility]) Mr. Kirchgessner is a patient of Dr. Kathalene Frames who was referred to me because of [place] limiting claudication.  I performed peripheral angiography and intervention on him 02/24/2019 with directional atherectomy, DCB of his mid and distal left SFA.  He had two-vessel runoff with disease in both the anterior tibial and peroneal arteries.  He did have mild to moderate iliac disease that did not appear to be hemodynamically significant and fairly symmetric right SFA and popliteal disease as well.  At this point he does not wish to pursue percutaneous intervention but may in the future.  We  will get Dopplers on him in October and I will see him back in 12 months.      Lorretta Harp MD FACP,FACC,FAHA, [facility] 02/29/2020 10:09 AM

## 2020-06-04 ENCOUNTER — Encounter: Payer: Self-pay | Admitting: Family Medicine

## 2020-06-04 ENCOUNTER — Other Ambulatory Visit: Payer: Self-pay

## 2020-06-04 ENCOUNTER — Ambulatory Visit (INDEPENDENT_AMBULATORY_CARE_PROVIDER_SITE_OTHER): Payer: Medicare HMO

## 2020-06-04 ENCOUNTER — Ambulatory Visit (INDEPENDENT_AMBULATORY_CARE_PROVIDER_SITE_OTHER): Payer: Medicare HMO | Admitting: Family Medicine

## 2020-06-04 VITALS — BP 200/98 | HR 53 | Temp 97.2°F | Ht 65.0 in | Wt 136.0 lb

## 2020-06-04 DIAGNOSIS — R059 Cough, unspecified: Secondary | ICD-10-CM | POA: Diagnosis not present

## 2020-06-04 DIAGNOSIS — R0600 Dyspnea, unspecified: Secondary | ICD-10-CM

## 2020-06-04 DIAGNOSIS — J441 Chronic obstructive pulmonary disease with (acute) exacerbation: Secondary | ICD-10-CM

## 2020-06-04 DIAGNOSIS — R062 Wheezing: Secondary | ICD-10-CM | POA: Diagnosis not present

## 2020-06-04 MED ORDER — PREDNISONE 20 MG PO TABS: 20.0000 mg | ORAL_TABLET | Freq: Two times a day (BID) | ORAL | 0 refills | Status: AC

## 2020-06-04 MED ORDER — TAMSULOSIN HCL 0.4 MG PO CAPS: 0.4000 mg | ORAL_CAPSULE | Freq: Every day | ORAL | 1 refills | Status: DC

## 2020-06-04 MED ORDER — ALBUTEROL SULFATE HFA 108 (90 BASE) MCG/ACT IN AERS: 2.0000 | INHALATION_SPRAY | Freq: Four times a day (QID) | RESPIRATORY_TRACT | 3 refills | Status: DC | PRN

## 2020-06-04 MED ORDER — DOXYCYCLINE HYCLATE 100 MG PO TABS: 100.0000 mg | ORAL_TABLET | Freq: Two times a day (BID) | ORAL | 0 refills | Status: DC

## 2020-06-04 MED ORDER — BACLOFEN 10 MG PO TABS: 10.0000 mg | ORAL_TABLET | Freq: Three times a day (TID) | ORAL | 0 refills | Status: DC | PRN

## 2020-06-04 MED ORDER — BREZTRI AEROSPHERE 160-9-4.8 MCG/ACT IN AERO: INHALATION_SPRAY | RESPIRATORY_TRACT | 3 refills | Status: DC

## 2020-06-04 NOTE — Patient Instructions (Signed)
Take Blood pressure medication as soon as you get home!! Re-start inhalers.  Use Breztri everyday.  Use albuterol as needed.   Start prednisone and doxycycline   Have chest xray and labs completed.   Try baclofen for pain in your side.   See me again in 2 weeks or sooner if symptoms worsen.

## 2020-06-07 ENCOUNTER — Telehealth: Payer: Self-pay | Admitting: Neurology

## 2020-06-07 NOTE — Telephone Encounter (Signed)
Prior Authorization for [employer] submitted via covermymeds. Awaiting response. [employer] is reviewing your PA request and will respond within 24 hours for Medicaid or up to 72 hours for non-Medicaid plans, based on the required timeframe determined by state or federal regulations. To check for an update later, open this request from your dashboard.  They did state the covered medication for plan is:   TRELEGY ELLIPTA INH POWDER 100/62. TRELEGY ELLIPTA INH POWDER 200/62.  (FYI in case this gets denial)

## 2020-06-10 DIAGNOSIS — J441 Chronic obstructive pulmonary disease with (acute) exacerbation: Secondary | ICD-10-CM | POA: Insufficient documentation

## 2020-06-10 NOTE — Assessment & Plan Note (Signed)
Orders Placed This Encounter  Procedures  . DG Chest 2 View    Standing Status:   Future    Number of Occurrences:   1    Standing Expiration Date:   06/04/2021    Order Specific Question:   Reason for Exam (SYMPTOM  OR DIAGNOSIS REQUIRED)    Answer:   cough, wheezing    Order Specific Question:   Preferred imaging location?    Answer:   [facility]  . B Nat Peptide  . COMPLETE METABOLIC PANEL WITH GFR  . CBC with Differential  Start doxycycline and prednisone.  Inhalers refilled . Instructed tole me know if having issues with affording these.   Recommend follow up if symptoms are not improving or are worsening.

## 2020-06-10 NOTE — Progress Notes (Signed)
Shane Carrillo - 75 y.o. male MRN [id number]  Date of birth: 04-Jul-1945  Subjective Chief Complaint  Patient presents with  . Shortness of Breath  . Back Pain    HPI Shane Carrillo is a 75 y.o. male here today with complaint of shortness of breath and back pain.    Shortness of breath started a few days ago.  Has gotten a little worse since onset.  Cough is productive of clear to light yellow sputum.  He has had some wheezing.  He has not been using inhalers regularly and continues to smoke.  States he is out of inhalers.   He denies chest pain, fever, chills, headache, nausea, vomiting, diarrhea, or edema.   Back pain is along rib area and started last week after moving some boxes.  Worsened after current illness.    ROS:  A comprehensive ROS was completed and negative except as noted per HPI  Allergies  Allergen Reactions  . Aspirin Other (See Comments)    GI upset - Pt states that he is able to take the coated form     Past Medical History:  Diagnosis Date  . Aortic ectasia, abdominal ([place]) 08/2016   On medicare screening u/s: Abdominal aortic atherosclerosis and mild ectasia. Maximal diameter 2.8 cm-->repeat u/s 5 yrs.  Shane Carrillo BPH with obstruction/lower urinary tract symptoms   . Chronic cough    upper airway cough syndrome/irritable larynx syndrome->Dr. Melvyn Novas.  Shane Carrillo COPD (chronic obstructive pulmonary disease) ([place])   . Coronary artery disease    Stent.    . DOE (dyspnea on exertion)    spirometry 02/2018 normal, but needs full PFT's per Dr. Melvyn Novas 03/2018.  No bronchodilators indicated as of 03/2018.  Shane Carrillo GERD (gastroesophageal reflux disease)    Dysphagia  . Hypercholesterolemia    Intol of atorva and simva  . Hypertension   . PAD (peripheral artery disease) ([facility])    Abd aortic athero  . Tobacco dependence   . Unintentional weight loss 2019   +Dysphagia,dec appetite/abd pain->pt was set to get EGD/colonoscopy late 2019 but he never followed up with GI to get this.    Past  Surgical History:  Procedure Laterality Date  . ABDOMINAL AORTOGRAM W/LOWER EXTREMITY N/A 02/24/2019   Procedure: ABDOMINAL AORTOGRAM W/LOWER EXTREMITY;  Surgeon: Lorretta Harp, MD;  Location: [facility];  Service: Cardiovascular;  Laterality: N/A;  . CORONARY ANGIOPLASTY WITH STENT PLACEMENT     Dr. Ouida Sills in [place], [place].  Shane Carrillo FOOT SURGERY    . PERIPHERAL VASCULAR INTERVENTION Left 02/24/2019   Procedure: PERIPHERAL VASCULAR INTERVENTION;  Surgeon: Lorretta Harp, MD;  Location: [facility];  Service: Cardiovascular;  Laterality: Left;  . ROTATOR CUFF REPAIR      Social History   Socioeconomic History  . Marital status: Divorced    Spouse name: Not on file  . Number of children: Not on file  . Years of education: Not on file  . Highest education level: Not on file  Occupational History  . Not on file  Tobacco Use  . Smoking status: Current Every Day Smoker    Packs/day: 1.00    Years: 55.00    Pack years: 55.00    Types: Cigarettes  . Smokeless tobacco: Never Used  Vaping Use  . Vaping Use: Never used  Substance and Sexual Activity  . Alcohol use: Yes    Comment: 4 days out of the week  . Drug use: No  . Sexual activity: Not  on file  Other Topics Concern  . Not on file  Social History Narrative   Married, 12 grown children.   Orig from [place], [place].   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   Tob: 55 pack-yr hx.  No alc, no drugs.   Social Determinants of Health   Financial Resource Strain: Not on file  Food Insecurity: Not on file  Transportation Needs: Not on file  Physical Activity: Not on file  Stress: Not on file  Social Connections: Not on file    Family History  Problem Relation Age of Onset  . Cancer - Cervical Mother   . Colon cancer Mother   . Colon cancer Father   . Prostate cancer Father   . Diabetes Maternal Grandmother     Health Maintenance  Topic Date Due  . Hepatitis C Screening  Never  done  . COVID-19 Vaccine (1) Never done  . Fecal DNA (Cologuard)  03/21/2019  . TETANUS/TDAP  01/07/2020  . HPV VACCINES  Aged Out  . INFLUENZA VACCINE  Discontinued  . PNA vac Low Risk Adult  Discontinued     ----------------------------------------------------------------------------------------------------------------------------------------------------------------------------------------------------------------- Physical Exam BP (!) 200/98 (BP Location: Left Arm, Patient Position: Sitting, Cuff Size: Normal)   Pulse (!) 53   Temp (!) 97.2 F (36.2 C)   Ht 5\' 5"  (1.651 m)   Wt 136 lb (61.7 kg)   SpO2 100%   BMI 22.63 kg/m   Physical Exam Constitutional:      Appearance: He is well-developed.  Eyes:     General: No scleral icterus. Cardiovascular:     Rate and Rhythm: Normal rate and regular rhythm.  Pulmonary:     Effort: Pulmonary effort is normal.     Breath sounds: Wheezing present.  Musculoskeletal:     Cervical back: Neck supple.  Neurological:     General: No focal deficit present.     Mental Status: He is alert.  Psychiatric:        Mood and Affect: Mood normal.        Behavior: Behavior normal.     ------------------------------------------------------------------------------------------------------------------------------------------------------------------------------------------------------------------- Assessment and Plan  COPD with exacerbation ([place]) Orders Placed This Encounter  Procedures  . DG Chest 2 View    Standing Status:   Future    Number of Occurrences:   1    Standing Expiration Date:   06/04/2021    Order Specific Question:   Reason for Exam (SYMPTOM  OR DIAGNOSIS REQUIRED)    Answer:   cough, wheezing    Order Specific Question:   Preferred imaging location?    Answer:   [facility]  . B Nat Peptide  . COMPLETE METABOLIC PANEL WITH GFR  . CBC with Differential  Start doxycycline and prednisone.  Inhalers refilled .  Instructed tole me know if having issues with affording these.   Recommend follow up if symptoms are not improving or are worsening.     Meds ordered this encounter  Medications  . Budeson-Glycopyrrol-Formoterol (BREZTRI AEROSPHERE) 160-9-4.8 MCG/ACT AERO    Sig: Take 2 inhalations BID    Dispense:  5.9 g    Refill:  3  . albuterol (PROAIR HFA) 108 (90 Base) MCG/ACT inhaler    Sig: Inhale 2 puffs into the lungs every 6 (six) hours as needed for wheezing or shortness of breath.    Dispense:  8 g    Refill:  3  . DISCONTD: tamsulosin (FLOMAX) 0.4 MG CAPS capsule  Sig: Take 1 capsule (0.4 mg total) by mouth daily after supper.    Dispense:  90 capsule    Refill:  1  . doxycycline (VIBRA-TABS) 100 MG tablet    Sig: Take 1 tablet (100 mg total) by mouth 2 (two) times daily.    Dispense:  20 tablet    Refill:  0  . predniSONE (DELTASONE) 20 MG tablet    Sig: Take 1 tablet (20 mg total) by mouth 2 (two) times daily with a meal for 5 days.    Dispense:  10 tablet    Refill:  0  . baclofen (LIORESAL) 10 MG tablet    Sig: Take 1 tablet (10 mg total) by mouth 3 (three) times daily as needed for muscle spasms.    Dispense:  30 each    Refill:  0    Return in about 2 weeks (around 06/18/2020) for dyspnea/back pain.    This visit occurred during the SARS-CoV-2 public health emergency.  Safety protocols were in place, including screening questions prior to the visit, additional usage of staff PPE, and extensive cleaning of exam room while observing appropriate contact time as indicated for disinfecting solutions.

## 2020-06-19 ENCOUNTER — Other Ambulatory Visit: Payer: Self-pay

## 2020-06-19 ENCOUNTER — Ambulatory Visit (INDEPENDENT_AMBULATORY_CARE_PROVIDER_SITE_OTHER): Payer: Medicare HMO | Admitting: Family Medicine

## 2020-06-19 ENCOUNTER — Encounter: Payer: Self-pay | Admitting: Family Medicine

## 2020-06-19 DIAGNOSIS — J441 Chronic obstructive pulmonary disease with (acute) exacerbation: Secondary | ICD-10-CM | POA: Diagnosis not present

## 2020-06-19 MED ORDER — TRELEGY ELLIPTA 100-62.5-25 MCG/INH IN AEPB: INHALATION_SPRAY | RESPIRATORY_TRACT | 6 refills | Status: DC

## 2020-06-19 MED ORDER — PREDNISONE 20 MG PO TABS: 20.0000 mg | ORAL_TABLET | Freq: Two times a day (BID) | ORAL | 0 refills | Status: AC

## 2020-06-19 MED ORDER — DOXYCYCLINE HYCLATE 100 MG PO TABS: 100.0000 mg | ORAL_TABLET | Freq: Two times a day (BID) | ORAL | 0 refills | Status: DC

## 2020-06-19 NOTE — Patient Instructions (Signed)
Start Trelegy and use daily.  Complete course of doxycycline and prednisone.  See me again in 1 month or sooner if needed.

## 2020-06-22 ENCOUNTER — Encounter: Payer: Self-pay | Admitting: Family Medicine

## 2020-06-22 NOTE — Progress Notes (Signed)
Shane Carrillo - 75 y.o. male MRN [id number]  Date of birth: Oct 21, 1945  Subjective Chief Complaint  Patient presents with  . Follow-up    HPI IRL Shane Carrillo is a 75 y.o. male here today for follow up of COPD exacerbation.  He has had improvement with steroid and doxycycline but not full resolution.  Previous CXR without pneumonia.  He continues to have cough, wheezing and increased sputum production.  He denies fever, chills, shortness of breath or chest pain.  He was unable to get breztri but it appears that trelegy is preferred.  He does have albuterol inhaler.  He does continue to smoke.   ROS:  A comprehensive ROS was completed and negative except as noted per HPI  Allergies  Allergen Reactions  . Aspirin Other (See Comments)    GI upset - Pt states that he is able to take the coated form     Past Medical History:  Diagnosis Date  . Aortic ectasia, abdominal ([place]) 08/2016   On medicare screening u/s: Abdominal aortic atherosclerosis and mild ectasia. Maximal diameter 2.8 cm-->repeat u/s 5 yrs.  [name] BPH with obstruction/lower urinary tract symptoms   . Chronic cough    upper airway cough syndrome/irritable larynx syndrome->Dr. Melvyn Novas.  [name] COPD (chronic obstructive pulmonary disease) ([place])   . Coronary artery disease    Stent.    . DOE (dyspnea on exertion)    spirometry 02/2018 normal, but needs full PFT's per Dr. Melvyn Novas 03/2018.  No bronchodilators indicated as of 03/2018.  [name] GERD (gastroesophageal reflux disease)    Dysphagia  . Hypercholesterolemia    Intol of atorva and simva  . Hypertension   . PAD (peripheral artery disease) ([facility])    Abd aortic athero  . Tobacco dependence   . Unintentional weight loss 2019   +Dysphagia,dec appetite/abd pain->pt was set to get EGD/colonoscopy late 2019 but he never followed up with GI to get this.    Past Surgical History:  Procedure Laterality Date  . ABDOMINAL AORTOGRAM W/LOWER EXTREMITY N/A 02/24/2019   Procedure: ABDOMINAL  AORTOGRAM W/LOWER EXTREMITY;  Surgeon: Lorretta Harp, MD;  Location: [facility];  Service: Cardiovascular;  Laterality: N/A;  . CORONARY ANGIOPLASTY WITH STENT PLACEMENT     Dr. Ouida Sills in [place], [place].  [name] FOOT SURGERY    . PERIPHERAL VASCULAR INTERVENTION Left 02/24/2019   Procedure: PERIPHERAL VASCULAR INTERVENTION;  Surgeon: Lorretta Harp, MD;  Location: [facility];  Service: Cardiovascular;  Laterality: Left;  . ROTATOR CUFF REPAIR      Social History   Socioeconomic History  . Marital status: Divorced    Spouse name: Not on file  . Number of children: Not on file  . Years of education: Not on file  . Highest education level: Not on file  Occupational History  . Not on file  Tobacco Use  . Smoking status: Current Every Day Smoker    Packs/day: 1.00    Years: 55.00    Pack years: 55.00    Types: Cigarettes  . Smokeless tobacco: Never Used  Vaping Use  . Vaping Use: Never used  Substance and Sexual Activity  . Alcohol use: Yes    Comment: 4 days out of the week  . Drug use: No  . Sexual activity: Not on file  Other Topics Concern  . Not on file  Social History Narrative   Married, 12 grown children.   Orig from [place], [place].   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   Tob: 55 pack-yr hx.  No alc, no drugs.   Social Determinants of Health   Financial Resource Strain: Not on file  Food Insecurity: Not on file  Transportation Needs: Not on file  Physical Activity: Not on file  Stress: Not on file  Social Connections: Not on file    Family History  Problem Relation Age of Onset  . Cancer - Cervical Mother   . Colon cancer Mother   . Colon cancer Father   . Prostate cancer Father   . Diabetes Maternal Grandmother     Health Maintenance  Topic Date Due  . Hepatitis C Screening  Never done  . COVID-19 Vaccine (1) 07/05/2020 (Originally 01/06/1951)  . TETANUS/TDAP  06/19/2021 (Originally 01/07/2020)  . Fecal  DNA (Cologuard)  06/19/2021 (Originally 03/21/2019)  . HPV VACCINES  Aged Out  . INFLUENZA VACCINE  Discontinued  . PNA vac Low Risk Adult  Discontinued     ----------------------------------------------------------------------------------------------------------------------------------------------------------------------------------------------------------------- Physical Exam BP (!) 181/96 (BP Location: Left Arm, Patient Position: Sitting, Cuff Size: Normal)   Pulse 76   Temp 98.4 F (36.9 C) (Oral)   Resp 20   Ht 5\' 5"  (1.651 m)   Wt 133 lb (60.3 kg)   SpO2 96%   BMI 22.13 kg/m   Physical Exam Constitutional:      Appearance: Normal appearance.  HENT:     Head: Normocephalic and atraumatic.  Eyes:     General: No scleral icterus. Cardiovascular:     Rate and Rhythm: Normal rate and regular rhythm.  Pulmonary:     Effort: Pulmonary effort is normal.     Breath sounds: Wheezing and rhonchi present.  Musculoskeletal:     Cervical back: Neck supple.  Lymphadenopathy:     Cervical: No cervical adenopathy.  Neurological:     General: No focal deficit present.     Mental Status: He is alert.  Psychiatric:        Mood and Affect: Mood normal.        Behavior: Behavior normal.     ------------------------------------------------------------------------------------------------------------------------------------------------------------------------------------------------------------------- Assessment and Plan  COPD with exacerbation ([place]) Improvement but not full resolution of symptoms since last visit.  Adding additional course of prednisone and doxycycline.  Start trelegy for daily maintenance.  Continue albuterol as needed.  Counseled on smoking cessation.   Return in about 4 weeks (around 07/17/2020) for COPD.     Meds ordered this encounter  Medications  . Fluticasone-Umeclidin-Vilant (TRELEGY ELLIPTA) 100-62.5-25 MCG/INH AEPB    Sig: One inhalation once  daily.    Dispense:  1 each    Refill:  6  . doxycycline (VIBRA-TABS) 100 MG tablet    Sig: Take 1 tablet (100 mg total) by mouth 2 (two) times daily.    Dispense:  20 tablet    Refill:  0  . predniSONE (DELTASONE) 20 MG tablet    Sig: Take 1 tablet (20 mg total) by mouth 2 (two) times daily with a meal for 5 days.    Dispense:  10 tablet    Refill:  0    Return in about 4 weeks (around 07/17/2020) for COPD.    This visit occurred during the SARS-CoV-2 public health emergency.  Safety protocols were in place, including screening questions prior to the visit, additional usage of staff PPE, and extensive cleaning of exam room while observing appropriate contact time as indicated for disinfecting solutions.

## 2020-06-22 NOTE — Assessment & Plan Note (Signed)
Improvement but not full resolution of symptoms since last visit.  Adding additional course of prednisone and doxycycline.  Start trelegy for daily maintenance.  Continue albuterol as needed.  Counseled on smoking cessation.   Return in about 4 weeks (around 07/17/2020) for COPD.

## 2020-07-18 ENCOUNTER — Ambulatory Visit: Payer: Medicare HMO | Admitting: Family Medicine

## 2020-07-19 ENCOUNTER — Telehealth: Payer: Self-pay | Admitting: General Practice

## 2020-07-19 NOTE — Telephone Encounter (Signed)
Transition Care Management Unsuccessful Follow-up Telephone Call  Date of discharge and from where:  07/18/20 [facility]  Attempts:  1st Attempt  Reason for unsuccessful TCM follow-up call:  Left voice message

## 2020-07-23 ENCOUNTER — Emergency Department (HOSPITAL_COMMUNITY)
Admission: EM | Admit: 2020-07-23 | Discharge: 2020-07-23 | Discharge status: Against Medical Advice | Payer: Medicare HMO | Attending: Emergency Medicine | Admitting: Emergency Medicine

## 2020-07-23 ENCOUNTER — Encounter (HOSPITAL_COMMUNITY): Payer: Self-pay

## 2020-07-23 ENCOUNTER — Other Ambulatory Visit: Payer: Self-pay

## 2020-07-23 DIAGNOSIS — U071 COVID-19: Secondary | ICD-10-CM | POA: Diagnosis present

## 2020-07-23 DIAGNOSIS — Z5321 Procedure and treatment not carried out due to patient leaving prior to being seen by health care provider: Secondary | ICD-10-CM | POA: Insufficient documentation

## 2020-07-23 NOTE — ED Provider Notes (Signed)
Emergency Medicine Provider Triage Evaluation Note  Shane Carrillo , a 75 y.o. male  was evaluated in triage.  Pt complains of Fatigue- known covid pos.  Review of Systems  Positive: fatigue Negative: sob  Physical Exam  BP (!) 142/84 (BP Location: Left Arm)   Pulse 86   Temp 99.1 F (37.3 C) (Oral)   Resp 18   SpO2 97%  Gen:   Awake, no distress   Resp:  Normal effort  MSK:   Moves extremities without difficulty  Other:    Medical Decision Making  Medically screening exam initiated at 12:45 PM.  Appropriate orders placed.  Shane Carrillo was informed that the remainder of the evaluation will be completed by another provider, this initial triage assessment does not replace that evaluation, and the importance of remaining in the ED until their evaluation is complete.  Labs and imaging initiated, 6 days out form onset of covid sxs   [name], PA-C 07/23/20 1248    [name], MD 07/31/20 2148

## 2020-07-23 NOTE — Telephone Encounter (Signed)
Transition Care Management Unsuccessful Follow-up Telephone Call  Date of discharge and from where:  07/18/20 [facility]  Attempts:  2nd Attempt  Reason for unsuccessful TCM follow-up call:  No answer/busy

## 2020-07-23 NOTE — ED Notes (Signed)
Pt left emergency department, informed about risk

## 2020-07-23 NOTE — ED Notes (Signed)
Pt didn't answer when called recheck vitals

## 2020-07-24 ENCOUNTER — Telehealth: Payer: Self-pay | Admitting: General Practice

## 2020-07-24 NOTE — Telephone Encounter (Signed)
Transition Care Management Unsuccessful Follow-up Telephone Call  Date of discharge and from where:  [facility]  Attempts:  1st Attempt  Reason for unsuccessful TCM follow-up call:  Left voice message

## 2020-07-24 NOTE — Telephone Encounter (Signed)
Transition Care Management Unsuccessful Follow-up Telephone Call  Date of discharge and from where: 07/23/2020 from [facility]  Attempts:  2nd Attempt  Reason for unsuccessful TCM follow-up call:  Left voice message

## 2020-07-25 NOTE — Telephone Encounter (Signed)
Transition Care Management Unsuccessful Follow-up Telephone Call  Date of discharge and from where:  07/18/20 [facility]  Attempts:  3rd Attempt  Reason for unsuccessful TCM follow-up call:  Left voice message

## 2020-07-25 NOTE — Telephone Encounter (Signed)
Transition Care Management Unsuccessful Follow-up Telephone Call  Date of discharge and from where:  07/23/20 from [facility]  Attempts:  3rd Attempt  Reason for unsuccessful TCM follow-up call:  Left voice message

## 2020-07-29 ENCOUNTER — Emergency Department (HOSPITAL_COMMUNITY): Payer: Medicare HMO

## 2020-07-29 ENCOUNTER — Encounter (HOSPITAL_COMMUNITY): Payer: Self-pay | Admitting: Emergency Medicine

## 2020-07-29 ENCOUNTER — Other Ambulatory Visit: Payer: Self-pay

## 2020-07-29 ENCOUNTER — Emergency Department (HOSPITAL_COMMUNITY)
Admission: EM | Admit: 2020-07-29 | Discharge: 2020-07-29 | Disposition: A | Discharge status: Home / Self Care | Payer: Medicare HMO | Attending: Emergency Medicine | Admitting: Emergency Medicine

## 2020-07-29 DIAGNOSIS — Z7982 Long term (current) use of aspirin: Secondary | ICD-10-CM | POA: Diagnosis not present

## 2020-07-29 DIAGNOSIS — Z28310 Unvaccinated for covid-19: Secondary | ICD-10-CM | POA: Insufficient documentation

## 2020-07-29 DIAGNOSIS — Z955 Presence of coronary angioplasty implant and graft: Secondary | ICD-10-CM | POA: Diagnosis not present

## 2020-07-29 DIAGNOSIS — J189 Pneumonia, unspecified organism: Secondary | ICD-10-CM | POA: Diagnosis not present

## 2020-07-29 DIAGNOSIS — Z79899 Other long term (current) drug therapy: Secondary | ICD-10-CM | POA: Insufficient documentation

## 2020-07-29 DIAGNOSIS — F17210 Nicotine dependence, cigarettes, uncomplicated: Secondary | ICD-10-CM | POA: Insufficient documentation

## 2020-07-29 DIAGNOSIS — J441 Chronic obstructive pulmonary disease with (acute) exacerbation: Secondary | ICD-10-CM | POA: Diagnosis not present

## 2020-07-29 DIAGNOSIS — U071 COVID-19: Secondary | ICD-10-CM | POA: Insufficient documentation

## 2020-07-29 DIAGNOSIS — Z7902 Long term (current) use of antithrombotics/antiplatelets: Secondary | ICD-10-CM | POA: Insufficient documentation

## 2020-07-29 DIAGNOSIS — R0602 Shortness of breath: Secondary | ICD-10-CM | POA: Diagnosis present

## 2020-07-29 DIAGNOSIS — J45901 Unspecified asthma with (acute) exacerbation: Secondary | ICD-10-CM | POA: Diagnosis not present

## 2020-07-29 DIAGNOSIS — I10 Essential (primary) hypertension: Secondary | ICD-10-CM | POA: Diagnosis not present

## 2020-07-29 DIAGNOSIS — I2510 Atherosclerotic heart disease of native coronary artery without angina pectoris: Secondary | ICD-10-CM | POA: Diagnosis not present

## 2020-07-29 DIAGNOSIS — R5383 Other fatigue: Secondary | ICD-10-CM

## 2020-07-29 LAB — CBC WITH DIFFERENTIAL/PLATELET
Abs Immature Granulocytes: 0.04 10*3/uL (ref 0.00–0.07)
Basophils Absolute: 0 10*3/uL (ref 0.0–0.1)
Basophils Relative: 0 %
Eosinophils Absolute: 0.1 10*3/uL (ref 0.0–0.5)
Eosinophils Relative: 1 %
HCT: 44.8 % (ref 39.0–52.0)
Hemoglobin: 14.4 g/dL (ref 13.0–17.0)
Immature Granulocytes: 1 %
Lymphocytes Relative: 20 %
Lymphs Abs: 1.6 10*3/uL (ref 0.7–4.0)
MCH: 27.3 pg (ref 26.0–34.0)
MCHC: 32.1 g/dL (ref 30.0–36.0)
MCV: 85 fL (ref 80.0–100.0)
Monocytes Absolute: 1.1 10*3/uL — ABNORMAL HIGH (ref 0.1–1.0)
Monocytes Relative: 14 %
Neutro Abs: 5.3 10*3/uL (ref 1.7–7.7)
Neutrophils Relative %: 64 %
Platelets: 378 10*3/uL (ref 150–400)
RBC: 5.27 MIL/uL (ref 4.22–5.81)
RDW: 14.1 % (ref 11.5–15.5)
WBC: 8.2 10*3/uL (ref 4.0–10.5)
nRBC: 0 % (ref 0.0–0.2)

## 2020-07-29 LAB — URINALYSIS, ROUTINE W REFLEX MICROSCOPIC
Bacteria, UA: NONE SEEN
Bilirubin Urine: NEGATIVE
Glucose, UA: NEGATIVE mg/dL
Hgb urine dipstick: NEGATIVE
Ketones, ur: 5 mg/dL — AB
Leukocytes,Ua: NEGATIVE
Nitrite: NEGATIVE
Protein, ur: 30 mg/dL — AB
Specific Gravity, Urine: 1.028 (ref 1.005–1.030)
pH: 5 (ref 5.0–8.0)

## 2020-07-29 LAB — COMPREHENSIVE METABOLIC PANEL
ALT: 28 U/L (ref 0–44)
AST: 30 U/L (ref 15–41)
Albumin: 2.9 g/dL — ABNORMAL LOW (ref 3.5–5.0)
Alkaline Phosphatase: 59 U/L (ref 38–126)
Anion gap: 12 (ref 5–15)
BUN: 22 mg/dL (ref 8–23)
CO2: 23 mmol/L (ref 22–32)
Calcium: 8.9 mg/dL (ref 8.9–10.3)
Chloride: 100 mmol/L (ref 98–111)
Creatinine, Ser: 1.01 mg/dL (ref 0.61–1.24)
GFR, Estimated: >60 mL/min (ref >60)
Glucose, Bld: 150 mg/dL — ABNORMAL HIGH (ref 70–99)
Potassium: 4.2 mmol/L (ref 3.5–5.1)
Sodium: 135 mmol/L (ref 135–145)
Total Bilirubin: 0.8 mg/dL (ref 0.3–1.2)
Total Protein: 6.1 g/dL — ABNORMAL LOW (ref 6.5–8.1)

## 2020-07-29 MED ORDER — DOXYCYCLINE HYCLATE 100 MG PO CAPS: 100.0000 mg | ORAL_CAPSULE | Freq: Two times a day (BID) | ORAL | 0 refills | Status: DC

## 2020-07-29 MED ORDER — DOXYCYCLINE HYCLATE 100 MG PO TABS
100.0000 mg | ORAL_TABLET | Freq: Once | ORAL | Status: AC
  Administered 2020-07-29: 100 mg via ORAL
  Filled 2020-07-29: qty 1

## 2020-07-29 NOTE — ED Provider Notes (Signed)
Emergency Medicine Provider Triage Evaluation Note  Shane Carrillo , a 75 y.o. male  was evaluated in triage.  Pt complains of generally not feeling well for the past 5 days. Reports fatigue, congestion, cough productive of green phlegm sputum, subjective fever, and body aches. No alleviating/aggravating factors. .  Review of Systems  Positive: Subjective fever, fatigue, body aches, congestion, cough.  Negative: Chest pain, dyspnea, vomiting  Physical Exam  BP (!) 147/84 (BP Location: Right Arm)   Pulse 93   Temp 97.7 F (36.5 C) (Oral)   Resp 18   SpO2 98%  Gen:   Awake, no distress   Resp:  Normal effort  MSK:   Moves extremities without difficulty  Other:  Abd: nontender.   Medical Decision Making  Medically screening exam initiated at 3:46 AM.  Appropriate orders placed.  Shane Carrillo was informed that the remainder of the evaluation will be completed by another provider, this initial triage assessment does not replace that evaluation, and the importance of remaining in the ED until their evaluation is complete.  Fatigue    Shane Dyke, PA-C 07/29/20 4287    Veryl Speak, MD 07/29/20 [phone]

## 2020-07-29 NOTE — ED Notes (Signed)
ED Provider at bedside. 

## 2020-07-29 NOTE — TOC Transition Note (Signed)
Transition of Care [facility]) - CM/SW Discharge Note   Patient Details  Name: Shane Carrillo MRN: [id number] Date of Birth: 05-Apr-1945  Transition of [facility]) CM/SW Contact:  Verdell Carmine, RN Phone Number: 07/29/2020, 3:19 PM   Clinical Narrative:     Home Health and Dme ordered for patient [facility] for PT OT and aide, they are aware of COVID dx. Walker and 3:1 for patient via [employer].         Patient Goals and CMS Choice        Discharge Placement                       Discharge Plan and Services                DME Arranged: 3-N-1, [place] rolling DME Agency: [employer] Date DME Agency Contacted: 07/29/20 Time DME Agency Contacted: [id number] Representative spoke with at DME Agency: [place]: PT, OT, Nurse's Aide [place] Agency: [place] Date [place]: 07/29/20 Time [place]: 1519 Representative spoke with at [place]: [place] ([place]) Interventions     Readmission Risk Interventions No flowsheet data found.

## 2020-07-29 NOTE — ED Notes (Signed)
DC instructions reviewed with pt.  PT verbalized instructions.  PT DC.

## 2020-07-29 NOTE — ED Notes (Signed)
Provided pt with sandwich and hot tea at his request.

## 2020-07-29 NOTE — Discharge Instructions (Addendum)
Take the antibiotics for possible pneumonia.  None looks different than the typical COVID infection.  Home health has been set up and hopefully will be contacting you about setting it up.

## 2020-07-29 NOTE — ED Notes (Signed)
Ambulated pt around stretcher. O2 sat remained between 94-96%. Pt states he "feels fine, just really weak and tired".

## 2020-07-29 NOTE — ED Triage Notes (Signed)
Pt c/o feeling sick and very fatigue since last Wednesday, denies any pain, fever or chills.

## 2020-07-29 NOTE — ED Provider Notes (Signed)
[facility] EMERGENCY DEPARTMENT Provider Note   CSN: [id number] Arrival date & time: 07/29/20  0300     History Chief Complaint  Patient presents with   Fatigue    Shane Carrillo is a 75 y.o. male.  HPI Patient presents with fatigue.  Some shortness of breath and cough.  On the first of the month patient was diagnosed with COVID.  Has been being more fatigued at home.  States he is having difficulty getting up to do normal activities.  Feels as if he needs home health.  Has a cough with mild sputum production.  We have been doing a little better then got worse again.  Not having fevers.  No nausea vomiting diarrhea.  History of COPD.  Not on oxygen at baseline.  Patient is unvaccinated for COVID.    Past Medical History:  Diagnosis Date   Aortic ectasia, abdominal ([place]) 08/2016   On medicare screening u/s: Abdominal aortic atherosclerosis and mild ectasia. Maximal diameter 2.8 cm-->repeat u/s 5 yrs.   BPH with obstruction/lower urinary tract symptoms    Chronic cough    upper airway cough syndrome/irritable larynx syndrome->Dr. Melvyn Novas.   COPD (chronic obstructive pulmonary disease) (HCC)    Coronary artery disease    Stent.     DOE (dyspnea on exertion)    spirometry 02/2018 normal, but needs full PFT's per Dr. Melvyn Novas 03/2018.  No bronchodilators indicated as of 03/2018.   GERD (gastroesophageal reflux disease)    Dysphagia   Hypercholesterolemia    Intol of atorva and simva   Hypertension    PAD (peripheral artery disease) (HCC)    Abd aortic athero   Tobacco dependence    Unintentional weight loss 2019   +Dysphagia,dec appetite/abd pain->pt was set to get EGD/colonoscopy late 2019 but he never followed up with GI to get this.    Patient Active Problem List   Diagnosis Date Noted   COPD with exacerbation ([place]) 06/10/2020   Head injury 01/02/2020   Acute pain of right knee 01/02/2020   COPD (chronic obstructive pulmonary disease) ([place]) 12/16/2019    Asthma exacerbation 11/20/2019   New onset of headaches 11/20/2019   Chest pain, rule out acute myocardial infarction 06/16/2019   Allergic rhinitis 06/03/2019   Rectal bleeding 02/07/2019   Mid back pain 02/01/2019   Peripheral arterial disease ([place]) 01/26/2019   Inflamed external hemorrhoid 11/23/2018   Unintended weight loss 11/02/2018   Essential hypertension 11/02/2018   Benign prostatic hyperplasia with urinary frequency 11/02/2018   Abnormal TSH 11/02/2018   CAD (coronary artery disease) 11/02/2018   Acute coronary syndrome ([place]) 10/09/2018   Cigarette smoker 02/26/2018   DOE (dyspnea on exertion) 02/25/2018   Abdominal aortic ectasia ([place]) 08/28/2016   Dysphagia 08/12/2016   Mild intermittent asthma without complication 08/65/7846   Erectile dysfunction 07/30/2009   Hyperlipemia, mixed 01/11/2007    Past Surgical History:  Procedure Laterality Date   ABDOMINAL AORTOGRAM W/LOWER EXTREMITY N/A 02/24/2019   Procedure: ABDOMINAL AORTOGRAM W/LOWER EXTREMITY;  Surgeon: Lorretta Harp, MD;  Location: [place] [facility];  Service: Cardiovascular;  Laterality: N/A;   CORONARY ANGIOPLASTY WITH STENT PLACEMENT     Dr. Ouida Sills in [place], [place].   FOOT SURGERY     PERIPHERAL VASCULAR INTERVENTION Left 02/24/2019   Procedure: PERIPHERAL VASCULAR INTERVENTION;  Surgeon: Lorretta Harp, MD;  Location: [place] [facility];  Service: Cardiovascular;  Laterality: Left;   ROTATOR CUFF REPAIR  Family History  Problem Relation Age of Onset   Cancer - Cervical Mother    Colon cancer Mother    Colon cancer Father    Prostate cancer Father    Diabetes Maternal Grandmother     Social History   Tobacco Use   Smoking status: Every Day    Packs/day: 1.00    Years: 55.00    Pack years: 55.00    Types: Cigarettes   Smokeless tobacco: Never  Vaping Use   Vaping Use: Never used  Substance Use Topics   Alcohol use: Yes    Comment: 4 days out of the week   Drug use: No     Home Medications Prior to Admission medications   Medication Sig Start Date End Date Taking? Authorizing Provider  doxycycline (VIBRAMYCIN) 100 MG capsule Take 1 capsule (100 mg total) by mouth 2 (two) times daily. 07/29/20  Yes Davonna Belling, MD  albuterol (PROAIR HFA) 108 (90 Base) MCG/ACT inhaler Inhale 2 puffs into the lungs every 6 (six) hours as needed for wheezing or shortness of breath. 06/04/20   [name], DO  amLODipine (NORVASC) 10 MG tablet Take 1 tablet (10 mg total) by mouth daily. 12/16/19 03/15/20  [name], DO  aspirin EC 325 MG tablet Take 325 mg by mouth daily.    [provider]  baclofen (LIORESAL) 10 MG tablet Take 1 tablet (10 mg total) by mouth 3 (three) times daily as needed for muscle spasms. 06/04/20   [name], DO  buPROPion (WELLBUTRIN SR) 100 MG 12 hr tablet Take 1 tablet (100 mg total) by mouth daily. 06/20/19   O'NealCassie Freer, MD  clopidogrel (PLAVIX) 75 MG tablet Take 75 mg by mouth daily.    [provider]  docusate sodium (COLACE) 100 MG capsule Take 1 capsule (100 mg total) by mouth 2 (two) times daily. Patient taking differently: Take 200 mg by mouth every other day. 10/10/18   Dixie Dials, MD  famotidine (PEPCID) 40 MG tablet Take 40 mg by mouth daily as needed for heartburn or indigestion.    [provider]  fluticasone (FLONASE) 50 MCG/ACT nasal spray Place 2 sprays into both nostrils daily. 06/03/19   [name], DO  Fluticasone-Umeclidin-Vilant (TRELEGY ELLIPTA) 100-62.5-25 MCG/INH AEPB One inhalation once daily. 06/19/20   [name], DO  lidocaine (XYLOCAINE) 2 % jelly Apply topically tid (plz sched with new provider for future fills) Patient taking differently: Apply 1 application topically 3 (three) times daily. 04/19/19   Libby Maw, MD  losartan (COZAAR) 50 MG tablet Take 1 tablet (50 mg total) by mouth daily. 12/16/19   [name], DO  Omega-3 Fatty Acids (FISH OIL) 1000 MG  CAPS Take 1 g by mouth daily.    [provider]  ondansetron (ZOFRAN-ODT) 4 MG disintegrating tablet Take 4 mg by mouth every 8 (eight) hours as needed for nausea/vomiting. 07/19/20   [provider]  potassium chloride (K-DUR) 10 MEQ tablet Take 1 tablet (10 mEq total) by mouth daily. Patient taking differently: Take 10 mEq by mouth See admin instructions. Three times a week 10/10/18   Dixie Dials, MD  rosuvastatin (CRESTOR) 40 MG tablet Take 1 tablet (40 mg total) by mouth daily. 12/16/19 03/15/20  [name], DO  sildenafil (VIAGRA) 100 MG tablet Take 1 tablet (100 mg total) by mouth daily as needed for erectile dysfunction. 11/17/19   [name], DO  tamsulosin (FLOMAX) 0.4 MG CAPS capsule Take 1 capsule (0.4 mg total)  by mouth daily after supper. 06/04/20   [name], DO    Allergies    Atorvastatin, Sildenafil, Simvastatin, and Aspirin  Review of Systems   Review of Systems  Constitutional:  Positive for appetite change and fatigue.  HENT:  Negative for congestion.   Respiratory:  Positive for cough and shortness of breath.   Gastrointestinal:  Negative for abdominal pain.  Genitourinary:  Negative for flank pain.  Musculoskeletal:  Negative for back pain.  Skin:  Negative for rash.  Neurological:  Negative for weakness.  Psychiatric/Behavioral:  Negative for confusion.    Physical Exam Updated Vital Signs BP (!) 157/77   Pulse 79   Temp 98.1 F (36.7 C) (Oral)   Resp 16   SpO2 95%   Physical Exam Vitals and nursing note reviewed.  HENT:     Head: Atraumatic.     Mouth/Throat:     Mouth: Mucous membranes are moist.  Eyes:     General: No scleral icterus.    Pupils: Pupils are equal, round, and reactive to light.  Cardiovascular:     Rate and Rhythm: Regular rhythm.  Pulmonary:     Breath sounds: No wheezing or rhonchi.  Abdominal:     Tenderness: There is no abdominal tenderness.  Musculoskeletal:        General: No tenderness.   Skin:    General: Skin is warm.     Capillary Refill: Capillary refill takes less than 2 seconds.  Neurological:     Mental Status: He is alert and oriented to person, place, and time.  Psychiatric:        Mood and Affect: Mood normal.    ED Results / Procedures / Treatments   Labs (all labs ordered are listed, but only abnormal results are displayed) Labs Reviewed  URINALYSIS, ROUTINE W REFLEX MICROSCOPIC - Abnormal; Notable for the following components:      Result Value   Color, Urine AMBER (*)    APPearance HAZY (*)    Ketones, ur 5 (*)    Protein, ur 30 (*)    All other components within normal limits  COMPREHENSIVE METABOLIC PANEL - Abnormal; Notable for the following components:   Glucose, Bld 150 (*)    Total Protein 6.1 (*)    Albumin 2.9 (*)    All other components within normal limits  CBC WITH DIFFERENTIAL/PLATELET - Abnormal; Notable for the following components:   Monocytes Absolute 1.1 (*)    All other components within normal limits    EKG None  Radiology DG Chest 2 View  Result Date: 07/29/2020 CLINICAL DATA:  Shortness of breath and fatigue. EXAM: CHEST - 2 VIEW COMPARISON:  06/04/2020 FINDINGS: Left lower lobe infiltrate. No edema, effusion, or air leak. Normal heart size and aortic contours. Generalized thoracic spine degeneration with ridging. IMPRESSION: Left lower lobe infiltrate. Electronically Signed   By: Monte Fantasia M.D.   On: 07/29/2020 05:15    Procedures Procedures   Medications Ordered in ED Medications  doxycycline (VIBRA-TABS) tablet 100 mg (100 mg Oral Given 07/29/20 1654)    ED Course  I have reviewed the triage vital signs and the nursing notes.  Pertinent labs & imaging results that were available during my care of the patient were reviewed by me and considered in my medical decision making (see chart for details).    MDM Rules/Calculators/A&P  Patient dealing with COVID.  X-ray showed infiltrate.   Not hypoxic.  COVID has been positive for at least 12 days.  Not hypoxic here.  Able ambulate but is having more fatigue.  Will treat with doxycycline to cover potential superinfection with bacterial pneumonia.  Have consulted transitions of care and is set up home health.  Face-to-face done.  Patient appears stable for discharge and outpatient follow-up with PCP Final Clinical Impression(s) / ED Diagnoses Final diagnoses:  Fatigue, unspecified type  COVID-19  Community acquired pneumonia, unspecified laterality    Rx / DC Orders ED Discharge Orders          [place]        07/29/20 1519    Face-to-face encounter (required for Medicare/Medicaid patients)       Comments: I Davonna Belling certify that this patient is under my care and that I, or a nurse practitioner or physician's assistant working with me, had a face-to-face encounter that meets the physician face-to-face encounter requirements with this patient on 07/29/2020. The encounter with the patient was in whole, or in part for the following medical condition(s) which is the primary reason for home health care (List medical condition):covid, and pneumonia   07/29/20 1519    doxycycline (VIBRAMYCIN) 100 MG capsule  2 times daily        07/29/20 1642             Davonna Belling, MD 07/29/20 1656

## 2020-07-30 ENCOUNTER — Telehealth: Payer: Self-pay

## 2020-07-30 NOTE — Telephone Encounter (Signed)
Transition Care Management Unsuccessful Follow-up Telephone Call  Date of discharge and from where:  07/29/2020 from [facility]  Attempts:  1st Attempt  Reason for unsuccessful TCM follow-up call:  Unable to leave message

## 2020-07-31 ENCOUNTER — Telehealth: Payer: Self-pay

## 2020-07-31 NOTE — Telephone Encounter (Signed)
Transition Care Management Unsuccessful Follow-up Telephone Call  Date of discharge and from where:  07/29/20 from [facility]  Attempts:  2nd Attempt  Reason for unsuccessful TCM follow-up call:  Unable to leave message

## 2020-07-31 NOTE — Telephone Encounter (Signed)
Received a vm from [facility] @ [phone] requesting VO to delay start of care until Thursday, 08/02/2020.  Pt was seen at ER for possible COVID.   Per Dr. Zigmund Daniel, VO given to Margarita Grizzle agreeing to delay start of care until 08/02/2020.

## 2020-08-01 NOTE — Telephone Encounter (Signed)
Transition Care Management Unsuccessful Follow-up Telephone Call  Date of discharge and from where:  07/29/20 Shane Carrillo  Attempts:  3rd Attempt  Reason for unsuccessful TCM follow-up call:  Unable to leave message

## 2020-08-02 ENCOUNTER — Telehealth: Payer: Self-pay

## 2020-08-02 NOTE — Telephone Encounter (Signed)
Received call from [facility] [phone]) requesting VO for PT: twice a week x 3 weeks & once a week x [redacted] weeks along with a Education officer, museum for [employer], transportation, and housekeeping.  Per Dr. Zigmund Daniel, VO given.   Attempted to contact [place]: no answer, vm full. Will reattempt on 08/03/2020

## 2020-08-03 ENCOUNTER — Telehealth: Payer: Self-pay

## 2020-08-03 NOTE — Telephone Encounter (Signed)
Spoke to patient and scheduled hospital follow-up.   Pt is aware that VO was given to PT to start with [facility].   Shane Carrillo) states [name] is not taking baclofen, Wellbutrin, lidocaine patch, or potassium. He stated that Cardiology stopped his Wellbutrin.   Dr. Zigmund Daniel has been advised.

## 2020-08-06 ENCOUNTER — Ambulatory Visit (INDEPENDENT_AMBULATORY_CARE_PROVIDER_SITE_OTHER): Payer: Medicare HMO | Admitting: Family Medicine

## 2020-08-06 ENCOUNTER — Other Ambulatory Visit: Payer: Self-pay

## 2020-08-06 ENCOUNTER — Encounter: Payer: Self-pay | Admitting: Family Medicine

## 2020-08-06 DIAGNOSIS — J410 Simple chronic bronchitis: Secondary | ICD-10-CM

## 2020-08-06 DIAGNOSIS — I10 Essential (primary) hypertension: Secondary | ICD-10-CM

## 2020-08-06 DIAGNOSIS — U071 COVID-19: Secondary | ICD-10-CM | POA: Diagnosis not present

## 2020-08-06 DIAGNOSIS — R634 Abnormal weight loss: Secondary | ICD-10-CM | POA: Diagnosis not present

## 2020-08-06 DIAGNOSIS — J1282 Pneumonia due to coronavirus disease 2019: Secondary | ICD-10-CM

## 2020-08-06 MED ORDER — MIRTAZAPINE 15 MG PO TABS: 15.0000 mg | ORAL_TABLET | Freq: Every day | ORAL | 3 refills | Status: DC

## 2020-08-06 MED ORDER — DOXYCYCLINE HYCLATE 100 MG PO CAPS: 100.0000 mg | ORAL_CAPSULE | Freq: Two times a day (BID) | ORAL | 0 refills | Status: DC

## 2020-08-06 NOTE — Assessment & Plan Note (Signed)
Discussed that he should be using trelegy daily for control of daily COPD symptoms and albuterol as needed. Encouraged to remain quit from smoking

## 2020-08-06 NOTE — Patient Instructions (Addendum)
You should be using trelegy every day whether you feel like you need if or not. Continue albuterol as needed.  Start mirtazapine at bedtime

## 2020-08-06 NOTE — Assessment & Plan Note (Signed)
Decreased appetite with weight loss.  Adding mirtazapine at bedtime.

## 2020-08-06 NOTE — Assessment & Plan Note (Signed)
Blood pressure is at goal at for age and co-morbidities.  I recommend continuation of current antihypertensive medications.  In addition they were instructed to follow a low sodium diet with regular exercise to help to maintain adequate control of blood pressure.

## 2020-08-06 NOTE — Assessment & Plan Note (Signed)
Still with some dyspnea with exertion.  Didn't complete course of doxycycline previously,  will add an additional week of doxycycline

## 2020-08-06 NOTE — Progress Notes (Signed)
Shane Carrillo - 75 y.o. male MRN [id number]  Date of birth: 1945-08-30  Subjective Chief Complaint  Patient presents with   Hospitalization Follow-up    HPI Shane Carrillo is a 75 y.o. male here today for ED follow up.  He was seen in the ED for COVID complicated by pneumonia.  He was treated with doxycycline.  He did not receive any antiviral treatment as it had been 12 days since symptom onset.  He was discharged home with home health nurse and social work for [employer].  Today he reports that he still feels tire but breathing is better.  He reports that he has trelegy inhaler but is only using on an as needed basis.  He also has albuterol that he uses as needed.  He has been able to remain quit from smoking for the past month.  He has not had any nicotine cravings.  He has had home health consult but has not returned phone calls to set up visits.   ROS:  A comprehensive ROS was completed and negative except as noted per HPI  Allergies  Allergen Reactions   Atorvastatin Itching   Sildenafil Palpitations   Simvastatin Itching   Aspirin Other (See Comments)    GI upset - Pt states that he is able to take the coated form     Past Medical History:  Diagnosis Date   Aortic ectasia, abdominal ([place]) 08/2016   On medicare screening u/s: Abdominal aortic atherosclerosis and mild ectasia. Maximal diameter 2.8 cm-->repeat u/s 5 yrs.   BPH with obstruction/lower urinary tract symptoms    Chronic cough    upper airway cough syndrome/irritable larynx syndrome->Dr. Melvyn Novas.   COPD (chronic obstructive pulmonary disease) (HCC)    Coronary artery disease    Stent.     DOE (dyspnea on exertion)    spirometry 02/2018 normal, but needs full PFT's per Dr. Melvyn Novas 03/2018.  No bronchodilators indicated as of 03/2018.   GERD (gastroesophageal reflux disease)    Dysphagia   Hypercholesterolemia    Intol of atorva and simva   Hypertension    PAD (peripheral artery disease) (HCC)    Abd aortic  athero   Tobacco dependence    Unintentional weight loss 2019   +Dysphagia,dec appetite/abd pain->pt was set to get EGD/colonoscopy late 2019 but he never followed up with GI to get this.    Past Surgical History:  Procedure Laterality Date   ABDOMINAL AORTOGRAM W/LOWER EXTREMITY N/A 02/24/2019   Procedure: ABDOMINAL AORTOGRAM W/LOWER EXTREMITY;  Surgeon: Lorretta Harp, MD;  Location: [facility];  Service: Cardiovascular;  Laterality: N/A;   CORONARY ANGIOPLASTY WITH STENT PLACEMENT     Dr. Ouida Sills in [place], [place].   FOOT SURGERY     PERIPHERAL VASCULAR INTERVENTION Left 02/24/2019   Procedure: PERIPHERAL VASCULAR INTERVENTION;  Surgeon: Lorretta Harp, MD;  Location: [facility];  Service: Cardiovascular;  Laterality: Left;   ROTATOR CUFF REPAIR      Social History   Socioeconomic History   Marital status: Divorced    Spouse name: Not on file   Number of children: Not on file   Years of education: Not on file   Highest education level: Not on file  Occupational History   Not on file  Tobacco Use   Smoking status: Every Day    Packs/day: 1.00    Years: 55.00    Pack years: 55.00    Types: Cigarettes   Smokeless tobacco: Never  Vaping Use   Vaping Use: Never used  Substance and Sexual Activity   Alcohol use: Yes    Comment: 4 days out of the week   Drug use: No   Sexual activity: Not on file  Other Topics Concern   Not on file  Social History Narrative   Married, 12 grown children.   Orig from [place], [place].   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   Tob: 55 pack-yr hx.  No alc, no drugs.   Social Determinants of Health   Financial Resource Strain: Not on file  Food Insecurity: Not on file  Transportation Needs: Not on file  Physical Activity: Not on file  Stress: Not on file  Social Connections: Not on file    Family History  Problem Relation Age of Onset   Cancer - Cervical Mother    Colon cancer Mother     Colon cancer Father    Prostate cancer Father    Diabetes Maternal Grandmother     Health Maintenance  Topic Date Due   COVID-19 Vaccine (1) Never done   Hepatitis C Screening  Never done   Zoster Vaccines- Shingrix (1 of 2) Never done   TETANUS/TDAP  06/19/2021 (Originally 01/07/2020)   Fecal DNA (Cologuard)  06/19/2021 (Originally 03/21/2019)   HPV VACCINES  Aged Out   INFLUENZA VACCINE  Discontinued   PNA vac Low Risk Adult  Discontinued     ----------------------------------------------------------------------------------------------------------------------------------------------------------------------------------------------------------------- Physical Exam BP 139/76 (BP Location: Left Arm, Patient Position: Sitting, Cuff Size: Small)   Pulse 75   Temp 97.9 F (36.6 C)   Wt 123 lb 8 oz (56 kg)   SpO2 100%   BMI 20.55 kg/m   Physical Exam Constitutional:      Appearance: Normal appearance.  HENT:     Head: Normocephalic and atraumatic.  Eyes:     General: No scleral icterus. Cardiovascular:     Rate and Rhythm: Normal rate and regular rhythm.  Pulmonary:     Effort: Pulmonary effort is normal.     Breath sounds: Normal breath sounds.  Musculoskeletal:     Cervical back: Neck supple.  Skin:    General: Skin is warm and dry.  Neurological:     General: No focal deficit present.     Mental Status: He is alert.  Psychiatric:        Mood and Affect: Mood normal.        Behavior: Behavior normal.    ------------------------------------------------------------------------------------------------------------------------------------------------------------------------------------------------------------------- Assessment and Plan  Essential hypertension Blood pressure is at goal at for age and co-morbidities.  I recommend continuation of current antihypertensive medications.  In addition they were instructed to follow a low sodium diet with regular exercise to help  to maintain adequate control of blood pressure.    COPD (chronic obstructive pulmonary disease) ([place]) Discussed that he should be using trelegy daily for control of daily COPD symptoms and albuterol as needed. Encouraged to remain quit from smoking   Pneumonia due to COVID-19 virus Still with some dyspnea with exertion.  Didn't complete course of doxycycline previously,  will add an additional week of doxycycline  Unintended weight loss Decreased appetite with weight loss.  Adding mirtazapine at bedtime.    Meds ordered this encounter  Medications   mirtazapine (REMERON) 15 MG tablet    Sig: Take 1 tablet (15 mg total) by mouth at bedtime.    Dispense:  30 tablet    Refill:  3    Return  in about 6 weeks (around 09/17/2020) for COPD/Appetite.    This visit occurred during the SARS-CoV-2 public health emergency.  Safety protocols were in place, including screening questions prior to the visit, additional usage of staff PPE, and extensive cleaning of exam room while observing appropriate contact time as indicated for disinfecting solutions.

## 2020-08-14 ENCOUNTER — Ambulatory Visit: Payer: Medicare HMO

## 2020-08-21 ENCOUNTER — Ambulatory Visit (INDEPENDENT_AMBULATORY_CARE_PROVIDER_SITE_OTHER): Payer: Medicare HMO | Admitting: Family Medicine

## 2020-08-21 ENCOUNTER — Other Ambulatory Visit: Payer: Self-pay

## 2020-08-21 VITALS — BP 138/78 | HR 92 | Ht 65.0 in | Wt 130.0 lb

## 2020-08-21 DIAGNOSIS — J441 Chronic obstructive pulmonary disease with (acute) exacerbation: Secondary | ICD-10-CM

## 2020-08-21 DIAGNOSIS — F17200 Nicotine dependence, unspecified, uncomplicated: Secondary | ICD-10-CM

## 2020-08-21 DIAGNOSIS — Z122 Encounter for screening for malignant neoplasm of respiratory organs: Secondary | ICD-10-CM

## 2020-08-21 DIAGNOSIS — Z0000 Encounter for general adult medical examination without abnormal findings: Secondary | ICD-10-CM | POA: Diagnosis not present

## 2020-08-21 DIAGNOSIS — U071 COVID-19: Secondary | ICD-10-CM | POA: Diagnosis not present

## 2020-08-21 DIAGNOSIS — J1282 Pneumonia due to coronavirus disease 2019: Secondary | ICD-10-CM

## 2020-08-21 DIAGNOSIS — Z1211 Encounter for screening for malignant neoplasm of colon: Secondary | ICD-10-CM

## 2020-08-21 NOTE — Progress Notes (Signed)
MEDICARE ANNUAL WELLNESS VISIT  08/21/2020  Subjective:  Shane Carrillo is a 75 y.o. male patient of Shane Nutting, DO who had a Medicare Annual Wellness Visit today. Shane Carrillo is Retired and lives alone. he has 12 children. he reports that he is socially active and does interact with friends/family regularly. he is moderately physically active and enjoys going to [employer] and to the casino.  Patient Care Team: Shane Nutting, DO as PCP - General (Family Medicine) O'Neal, Cassie Freer, MD as PCP - Cardiology (Cardiology) Dixie Dials, MD as Consulting Physician (Cardiology) Margaretmary Bayley, MD as Referring Physician (Gastroenterology)  Advanced Directives 08/21/2020 07/29/2020 07/23/2020 06/16/2019 02/24/2019 11/26/2018 10/09/2018  Does Patient Have a Medical Advance Directive? No Yes No No No No No  Would patient like information on creating a medical advance directive? No - Patient declined - No - Patient declined No - Patient declined No - Patient declined No - Patient declined No - Patient declined    Hospital Utilization Over the Past 12 Months: # of hospitalizations or ER visits: 3 # of surgeries: 0  Review of Systems    Patient reports that his overall health is worse when compared to last year.  Review of Systems: History obtained from chart review and the patient  All other systems negative.  Pain Assessment Pain : No/denies pain     Current Medications & Allergies (verified) Allergies as of 08/21/2020       Reactions   Atorvastatin Itching   Sildenafil Palpitations   Simvastatin Itching   Aspirin Other (See Comments)   GI upset - Pt states that he is able to take the coated form         Medication List        Accurate as of August 21, 2020  2:46 PM. If you have any questions, ask your nurse or doctor.          albuterol 108 (90 Base) MCG/ACT inhaler Commonly known as: ProAir HFA Inhale 2 puffs into the lungs every 6 (six) hours as needed for wheezing or  shortness of breath.   amLODipine 10 MG tablet Commonly known as: NORVASC Take 1 tablet (10 mg total) by mouth daily.   aspirin EC 325 MG tablet Take 325 mg by mouth daily.   baclofen 10 MG tablet Commonly known as: LIORESAL Take 1 tablet (10 mg total) by mouth 3 (three) times daily as needed for muscle spasms.   clopidogrel 75 MG tablet Commonly known as: PLAVIX Take 75 mg by mouth daily.   docusate sodium 100 MG capsule Commonly known as: COLACE Take 1 capsule (100 mg total) by mouth 2 (two) times daily. What changed:  how much to take when to take this   doxycycline 100 MG capsule Commonly known as: VIBRAMYCIN Take 1 capsule (100 mg total) by mouth 2 (two) times daily.   famotidine 40 MG tablet Commonly known as: PEPCID Take 40 mg by mouth daily as needed for heartburn or indigestion.   Fish Oil 1000 MG Caps Take 1 g by mouth daily.   fluticasone 50 MCG/ACT nasal spray Commonly known as: FLONASE Place 2 sprays into both nostrils daily.   lidocaine 2 % jelly Commonly known as: XYLOCAINE Apply topically tid (plz sched with new provider for future fills) What changed:  how much to take how to take this when to take this additional instructions   losartan 50 MG tablet Commonly known as: COZAAR Take 1 tablet (50 mg total) by mouth daily.  mirtazapine 15 MG tablet Commonly known as: REMERON Take 1 tablet (15 mg total) by mouth at bedtime.   ondansetron 4 MG disintegrating tablet Commonly known as: ZOFRAN-ODT Take 4 mg by mouth every 8 (eight) hours as needed for nausea/vomiting.   potassium chloride 10 MEQ tablet Commonly known as: KLOR-CON Take 1 tablet (10 mEq total) by mouth daily. What changed:  when to take this additional instructions   rosuvastatin 40 MG tablet Commonly known as: CRESTOR Take 1 tablet (40 mg total) by mouth daily.   sildenafil 100 MG tablet Commonly known as: VIAGRA Take 1 tablet (100 mg total) by mouth daily as needed for  erectile dysfunction.   tamsulosin 0.4 MG Caps capsule Commonly known as: FLOMAX Take 1 capsule (0.4 mg total) by mouth daily after supper.   Trelegy Ellipta 100-62.5-25 MCG/INH Aepb Generic drug: Fluticasone-Umeclidin-Vilant One inhalation once daily.        History (reviewed): Past Medical History:  Diagnosis Date   Aortic ectasia, abdominal ([place]) 08/2016   On medicare screening u/s: Abdominal aortic atherosclerosis and mild ectasia. Maximal diameter 2.8 cm-->repeat u/s 5 yrs.   BPH with obstruction/lower urinary tract symptoms    Chronic cough    upper airway cough syndrome/irritable larynx syndrome->Dr. Melvyn Novas.   COPD (chronic obstructive pulmonary disease) (HCC)    Coronary artery disease    Stent.     DOE (dyspnea on exertion)    spirometry 02/2018 normal, but needs full PFT's per Dr. Melvyn Novas 03/2018.  No bronchodilators indicated as of 03/2018.   GERD (gastroesophageal reflux disease)    Dysphagia   Hypercholesterolemia    Intol of atorva and simva   Hypertension    PAD (peripheral artery disease) (HCC)    Abd aortic athero   Tobacco dependence    Unintentional weight loss 2019   +Dysphagia,dec appetite/abd pain->pt was set to get EGD/colonoscopy late 2019 but he never followed up with GI to get this.   Past Surgical History:  Procedure Laterality Date   ABDOMINAL AORTOGRAM W/LOWER EXTREMITY N/A 02/24/2019   Procedure: ABDOMINAL AORTOGRAM W/LOWER EXTREMITY;  Surgeon: Lorretta Harp, MD;  Location: [facility];  Service: Cardiovascular;  Laterality: N/A;   CORONARY ANGIOPLASTY WITH STENT PLACEMENT     Dr. Ouida Sills in [place], [place].   FOOT SURGERY     PERIPHERAL VASCULAR INTERVENTION Left 02/24/2019   Procedure: PERIPHERAL VASCULAR INTERVENTION;  Surgeon: Lorretta Harp, MD;  Location: [facility];  Service: Cardiovascular;  Laterality: Left;   ROTATOR CUFF REPAIR     Family History  Problem Relation Age of Onset   Cancer - Cervical Mother    Colon  cancer Mother    Colon cancer Father    Prostate cancer Father    Diabetes Maternal Grandmother    Social History   Socioeconomic History   Marital status: Divorced    Spouse name: Not on file   Number of children: Not on file   Years of education: 12th grade   Highest education level: Not on file  Occupational History    Comment: Retired  Tobacco Use   Smoking status: Former    Packs/day: 1.00    Years: 55.00    Pack years: 55.00    Types: Cigarettes    Quit date: 07/18/2020    Years since quitting: 0.0   Smokeless tobacco: Never  Vaping Use   Vaping Use: Never used  Substance and Sexual Activity   Alcohol use: Not Currently   Drug use:  No   Sexual activity: Not on file  Other Topics Concern   Not on file  Social History Narrative   Lives alone. He has 12 grown children. He enjoys going to the race track and the casino.   Social Determinants of Health   Financial Resource Strain: Medium Risk   Difficulty of Paying Living Expenses: Somewhat hard  Food Insecurity: Food Insecurity Present   Worried About [place] in the Last Year: Sometimes true   Ran Out of Food in the Last Year: Sometimes true  Transportation Needs: No Transportation Needs   Lack of Transportation (Medical): No   Lack of Transportation (Non-Medical): No  Physical Activity: Insufficiently Active   Days of Exercise per Week: 3 days   Minutes of Exercise per Session: 30 min  Stress: No Stress Concern Present   Feeling of Stress : Only a little  Social Connections: Socially Isolated   Frequency of Communication with Friends and Family: Three times a week   Frequency of Social Gatherings with Friends and Family: Once a week   Attends Religious Services: Never   Marine scientist or Organizations: No   Attends Archivist Meetings: Never   Marital Status: Divorced    Activities of Daily Living In your present state of health, do you have any difficulty performing the  following activities: 08/21/2020 06/19/2020  Hearing? N N  Vision? Y N  Comment patient has noticed a little difficulty seeing. -  Difficulty concentrating or making decisions? Y N  Comment he has noticed some memory loss. -  Walking or climbing stairs? Y N  Comment sometimes -  Dressing or bathing? N N  Doing errands, shopping? N N  Preparing Food and eating ? N -  Using the Toilet? N -  In the past six months, have you accidently leaked urine? N -  Do you have problems with loss of bowel control? N -  Managing your Medications? N -  Managing your Finances? N -  Housekeeping or managing your Housekeeping? Y -  Comment has noticed some issues due to shortness of breath. -  Some recent data might be hidden    Patient Education/Literacy How often do you need to have someone help you when you read instructions, pamphlets, or other written materials from your doctor or pharmacy?: 1 - Never What is the last grade level you completed in school?: 12th grade  Exercise Current Exercise Habits: Home exercise routine, Type of exercise: walking, Time (Minutes): 35, Frequency (Times/Week): 3, Weekly Exercise (Minutes/Week): 105, Intensity: Moderate, Exercise limited by: respiratory conditions(s)  Diet Patient reports consuming  1-2  meals a day and 2-3 snack(s) a day Patient reports that his primary diet is: Regular Patient reports that she does have regular access to food.   Depression Screen PHQ 2/9 Scores 08/21/2020 06/04/2020 07/19/2018  PHQ - 2 Score 0 0 0     Fall Risk Fall Risk  08/21/2020 06/04/2020 07/19/2018  Falls in the past year? 1 0 0  Number falls in past yr: 0 0 0  Injury with Fall? 1 0 0  Risk for fall due to : History of fall(s) No Fall Risks -  Follow up Education provided;Falls evaluation completed;Falls prevention discussed Falls evaluation completed Falls evaluation completed     Objective:   BP 138/78 (BP Location: Right Arm, Patient Position: Sitting, Cuff Size: Normal)    Pulse 92   Ht 5\' 5"  (1.651 m)   Wt 130 lb 0.6  oz (59 kg)   SpO2 99%   BMI 21.64 kg/m   Last Weight  Most recent update: 08/21/2020  2:07 PM    Weight  59 kg (130 lb 0.6 oz)             Body mass index is 21.64 kg/m.  Hearing/Vision  Ferris did not have difficulty with hearing/understanding during the face-to-face interview Laramie did not have difficulty with his vision during the face-to-face interview Reports that he has not had a formal eye exam by an eye care professional within the past year Reports that he has not had a formal hearing evaluation within the past year  Cognitive Function: 6CIT Screen 08/21/2020  What Year? 0 points  What month? 0 points  What time? 0 points  Count back from 20 0 points  Months in reverse 0 points  Repeat phrase 0 points  Total Score 0    Normal Cognitive Function Screening: Yes (Normal:0-7, Significant for Dysfunction: >8)  Immunization & Health Maintenance Record Immunization History  Administered Date(s) Administered   Pneumococcal Conjugate-13 02/28/2016, 02/28/2016   Tdap 01/06/2010, 01/06/2010    Health Maintenance  Topic Date Due   COVID-19 Vaccine (1) 09/06/2020 (Originally 01/06/1951)   Zoster Vaccines- Shingrix (1 of 2) 11/21/2020 (Originally 01/05/1997)   TETANUS/TDAP  06/19/2021 (Originally 01/07/2020)   Fecal DNA (Cologuard)  06/19/2021 (Originally 03/21/2019)   Hepatitis C Screening  08/21/2021 (Originally 01/05/1964)   HPV VACCINES  Aged Out   INFLUENZA VACCINE  Discontinued   PNA vac Low Risk Adult  Discontinued       Assessment  This is a routine wellness examination for [employer].  Health Maintenance: Due or Overdue There are no preventive care reminders to display for this patient.   [name] does need a referral for Community Assistance: Care Management:   yes Social Work:    no Prescription Assistance:  no Nutrition/Diabetes Education:  no   Plan:  Personalized Goals  Goals  Addressed               This Visit's Progress     Patient Stated (pt-stated)        08/21/2020 AWV Goal: Tobacco Cessation  Smoking cessation instruction/counseling given:  commended patient for quitting and reviewed strategies for preventing relapses  Patient will verbalize understanding of the health risks associated with smoking/tobacco use Lung cancer or lung disease, such as COPD Heart disease. Stroke. Heart attack Infertility Osteoporosis and bone fractures. Patient will create a plan to quit smoking/using tobacco Pick a date to quit.  Write down the reasons why you are quitting and put it where you will see it often. Identify the people, places, things, and activities that make you want to smoke (triggers) and avoid them. Make sure to take these actions: Throw away all cigarettes at home, at work, and in your car. Throw away smoking accessories, such as Scientist, research (medical). Clean your car and make sure to empty the ashtray. Clean your home, including curtains and carpets. Tell your family, friends, and coworkers that you are quitting. Support from your loved ones can make quitting easier. Talk with your health care provider about your options for quitting smoking. Find out what treatment options are covered by your health insurance. Patient will be able to demonstrate knowledge of tobacco cessation strategies that may maximize success Quitting "cold [place]" is more successful than gradually quitting. Attending in-person counseling to help you build problem-solving skills.  Finding resources and support systems that  can help you to quit smoking and remain smoke-free after you quit. These resources are most helpful when you use them often. They can include: Online chats with a Social worker. Telephone quitlines. Printed Furniture conservator/restorer. Support groups or group counseling. Text messaging programs. Mobile phone applications. Taking medicines to help you quit  smoking: Nicotine patches, gum, or lozenges. Nicotine inhalers or sprays. Non-nicotine medicine that is taken by mouth. Patient will note get discouraged if the process is difficult Over the next year, patient will stop smoking or using other forms of tobacco  Smoking cessation instruction/counseling given:  commended patient for quitting and reviewed strategies for preventing relapses          Personalized Health Maintenance & Screening Recommendations  Td vaccine Colorectal cancer screening Shingrix vaccine Eye exam  Lung Cancer Screening Recommended: yes (Low Dose CT Chest recommended if Age 80-80 years, 30 pack-year currently smoking OR have quit w/in past 15 years) Hepatitis C Screening recommended: yes HIV Screening recommended: yes  Advanced Directives: Written information was not given per the patient's request.  Referrals & Orders Orders Placed This Encounter  Procedures   Ambulatory Referral for Lung Cancer Scre   AMB Referral to [facility]   Ambulatory referral to Gastroenterology (for Colonoscopy)     Follow-up Plan Follow-up with Shane Nutting, DO as planned Please schedule your eye exam; shingrix vaccine and tetanus shot can be done at your pharmacy.  Case management referral has been sent to help you set up meals on wheels and to help follow up on your COPD.  Medicare wellness in one year.  AVS printed and given to patient.   I have personally reviewed and noted the following in the patient's chart:   Medical and social history Use of alcohol, tobacco or illicit drugs  Current medications and supplements Functional ability and status Nutritional status Physical activity Advanced directives List of other physicians Hospitalizations, surgeries, and ER visits in previous 12 months Vitals Screenings to include cognitive, depression, and falls Referrals and appointments  In addition, I have reviewed and discussed with patient certain  preventive protocols, quality metrics, and best practice recommendations. A written personalized care plan for preventive services as well as general preventive health recommendations were provided to patient.     Tinnie Gens, RN  08/21/2020

## 2020-08-21 NOTE — Patient Instructions (Addendum)
[place] Maintenance Summary and Written Plan of Care  Shane Carrillo ,  Thank you for allowing me to perform your Medicare Annual Wellness Visit and for your ongoing commitment to your health.   Health Maintenance & Immunization History Health Maintenance  Topic Date Due   COVID-19 Vaccine (1) 09/06/2020 (Originally 01/06/1951)   Zoster Vaccines- Shingrix (1 of 2) 11/21/2020 (Originally 01/05/1997)   TETANUS/TDAP  06/19/2021 (Originally 01/07/2020)   Fecal DNA (Cologuard)  06/19/2021 (Originally 03/21/2019)   Hepatitis C Screening  08/21/2021 (Originally 01/05/1964)   HPV VACCINES  Aged Out   INFLUENZA VACCINE  Discontinued   PNA vac Low Risk Adult  Discontinued   Immunization History  Administered Date(s) Administered   Pneumococcal Conjugate-13 02/28/2016, 02/28/2016   Tdap 01/06/2010, 01/06/2010    These are the patient goals that we discussed:  Goals Addressed               This Visit's Progress     Patient Stated (pt-stated)        08/21/2020 AWV Goal: Tobacco Cessation  Smoking cessation instruction/counseling given:  commended patient for quitting and reviewed strategies for preventing relapses  Patient will verbalize understanding of the health risks associated with smoking/tobacco use Lung cancer or lung disease, such as COPD Heart disease. Stroke. Heart attack Infertility Osteoporosis and bone fractures. Patient will create a plan to quit smoking/using tobacco Pick a date to quit.  Write down the reasons why you are quitting and put it where you will see it often. Identify the people, places, things, and activities that make you want to smoke (triggers) and avoid them. Make sure to take these actions: Throw away all cigarettes at home, at work, and in your car. Throw away smoking accessories, such as Scientist, research (medical). Clean your car and make sure to empty the ashtray. Clean your home, including curtains and carpets. Tell  your family, friends, and coworkers that you are quitting. Support from your loved ones can make quitting easier. Talk with your health care provider about your options for quitting smoking. Find out what treatment options are covered by your health insurance. Patient will be able to demonstrate knowledge of tobacco cessation strategies that may maximize success Quitting "cold [place]" is more successful than gradually quitting. Attending in-person counseling to help you build problem-solving skills.  Finding resources and support systems that can help you to quit smoking and remain smoke-free after you quit. These resources are most helpful when you use them often. They can include: Online chats with a Social worker. Telephone quitlines. Printed Furniture conservator/restorer. Support groups or group counseling. Text messaging programs. Mobile phone applications. Taking medicines to help you quit smoking: Nicotine patches, gum, or lozenges. Nicotine inhalers or sprays. Non-nicotine medicine that is taken by mouth. Patient will note get discouraged if the process is difficult Over the next year, patient will stop smoking or using other forms of tobacco  Smoking cessation instruction/counseling given:  commended patient for quitting and reviewed strategies for preventing relapses            This is a list of Health Maintenance Items that are overdue or due now: Td vaccine Colorectal cancer screening Shingrix vaccine Eye exam   Orders/Referrals Placed Today: Orders Placed This Encounter  Procedures   Ambulatory Referral for Lung Cancer Scre    Referral Priority:   Routine    Referral Type:   Consultation    Referral Reason:   Specialty Services Required    Number  of Visits Requested:   1   AMB Referral to [facility] Coordinaton    Referral Priority:   Routine    Referral Type:   Consultation    Referral Reason:   Care Coordination    Number of Visits Requested:   1   Ambulatory  referral to Gastroenterology (for Colonoscopy)    Referral Priority:   Routine    Referral Type:   Consultation    Referral Reason:   Specialty Services Required    Number of Visits Requested:   1   (Contact our referral department at [phone] if you have not spoken with someone about your referral appointment within the next 5 days)    Follow-up Plan Follow-up with [name], DO as planned Please schedule your eye exam; shingrix vaccine and tetanus shot can be done at your pharmacy.  Case management referral has been sent to help you set up meals on wheels and to help follow up on your COPD.  Medicare wellness in one year.  AVS printed and given to patient.     Health Maintenance, Male Adopting a healthy lifestyle and getting preventive care are important in promoting health and wellness. Ask your health care provider about: The right schedule for you to have regular tests and exams. Things you can do on your own to prevent diseases and keep yourself healthy. What should I know about diet, weight, and exercise? Eat a healthy diet  Eat a diet that includes plenty of vegetables, fruits, low-fat dairy products, and lean protein. Do not eat a lot of foods that are high in solid fats, added sugars, or sodium.  Maintain a healthy weight Body mass index (BMI) is a measurement that can be used to identify possible weight problems. It estimates body fat based on height and weight. Your health care provider can help determine your BMI and help you achieve or maintain ahealthy weight. Get regular exercise Get regular exercise. This is one of the most important things you can do for your health. Most adults should: Exercise for at least 150 minutes each week. The exercise should increase your heart rate and make you sweat (moderate-intensity exercise). Do strengthening exercises at least twice a week. This is in addition to the moderate-intensity exercise. Spend less time sitting.  Even light physical activity can be beneficial. Watch cholesterol and blood lipids Have your blood tested for lipids and cholesterol at 74 years of age, then havethis test every 5 years. You may need to have your cholesterol levels checked more often if: Your lipid or cholesterol levels are high. You are older than 74 years of age. You are at high risk for heart disease. What should I know about cancer screening? Many types of cancers can be detected early and may often be prevented. Depending on your health history and family history, you may need to have cancer screening at various ages. This may include screening for: Colorectal cancer. Prostate cancer. Skin cancer. Lung cancer. What should I know about heart disease, diabetes, and high blood pressure? Blood pressure and heart disease High blood pressure causes heart disease and increases the risk of stroke. This is more likely to develop in people who have high blood pressure readings, are of African descent, or are overweight. Talk with your health care provider about your target blood pressure readings. Have your blood pressure checked: Every 3-5 years if you are 25-28 years of age. Every year if you are 50 years old or older. If you are  between the ages of 90 and 30 and are a current or former smoker, ask your health care provider if you should have a one-time screening for abdominal aortic aneurysm (AAA). Diabetes Have regular diabetes screenings. This checks your fasting blood sugar level. Have the screening done: Once every three years after age 22 if you are at a normal weight and have a low risk for diabetes. More often and at a younger age if you are overweight or have a high risk for diabetes. What should I know about preventing infection? Hepatitis B If you have a higher risk for hepatitis B, you should be screened for this virus. Talk with your health care provider to find out if you are at risk forhepatitis B  infection. Hepatitis C Blood testing is recommended for: Everyone born from [birth year] through 1965. Anyone with known risk factors for hepatitis C. Sexually transmitted infections (STIs) You should be screened each year for STIs, including gonorrhea and chlamydia, if: You are sexually active and are younger than 74 years of age. You are older than 74 years of age and your health care provider tells you that you are at risk for this type of infection. Your sexual activity has changed since you were last screened, and you are at increased risk for chlamydia or gonorrhea. Ask your health care provider if you are at risk. Ask your health care provider about whether you are at high risk for HIV. Your health care provider may recommend a prescription medicine to help prevent HIV infection. If you choose to take medicine to prevent HIV, you should first get tested for HIV. You should then be tested every 3 months for as long as you are taking the medicine. Follow these instructions at home: Lifestyle Do not use any products that contain nicotine or tobacco, such as cigarettes, e-cigarettes, and chewing tobacco. If you need help quitting, ask your health care provider. Do not use street drugs. Do not share needles. Ask your health care provider for help if you need support or information about quitting drugs. Alcohol use Do not drink alcohol if your health care provider tells you not to drink. If you drink alcohol: Limit how much you have to 0-2 drinks a day. Be aware of how much alcohol is in your drink. In the [place] one drink equals one 12 oz bottle of beer (355 mL), one 5 oz glass of wine (148 mL), or one 1 oz glass of hard liquor (44 mL). General instructions Schedule regular health, dental, and eye exams. Stay current with your vaccines. Tell your health care provider if: You often feel depressed. You have ever been abused or do not feel safe at home. Summary Adopting a healthy lifestyle and  getting preventive care are important in promoting health and wellness. Follow your health care provider's instructions about healthy diet, exercising, and getting tested or screened for diseases. Follow your health care provider's instructions on monitoring your cholesterol and blood pressure. This information is not intended to replace advice given to you by your health care provider. Make sure you discuss any questions you have with your healthcare provider. Document Revised: 01/27/2018 Document Reviewed: 01/27/2018 Elsevier Patient Education  2022 [employer].

## 2020-08-23 ENCOUNTER — Telehealth: Payer: Self-pay

## 2020-08-23 NOTE — Telephone Encounter (Signed)
Ok to give d/c order.

## 2020-08-23 NOTE — Telephone Encounter (Signed)
Genevieve @ [facility] requesting discharge order.   Dr. Zigmund Daniel please advise.

## 2020-08-24 NOTE — Telephone Encounter (Signed)
Returned call to [place]. VO given to D/C orders.

## 2020-08-29 ENCOUNTER — Other Ambulatory Visit: Payer: Self-pay | Admitting: *Deleted

## 2020-08-29 DIAGNOSIS — Z87891 Personal history of nicotine dependence: Secondary | ICD-10-CM

## 2020-09-03 ENCOUNTER — Telehealth: Payer: Self-pay | Admitting: Family Medicine

## 2020-09-03 NOTE — Telephone Encounter (Signed)
   Telephone encounter was:  Successful.  09/03/2020 Name: AREG BIALAS MRN: [id number] DOB: Apr 21, 1945  ANDRE SWANDER is a 75 y.o. year old male who is a primary care patient of [name], DO . The community resource team was consulted for assistance with [place] guide performed the following interventions: Patient provided with information about care guide support team and interviewed to confirm resource needs Discussed resources to assist with food banks. Emailed pt, at address on file, list of local food resources in [place].  .  Follow Up Plan:  No further follow up planned at this time. The patient has been provided with needed resources.  April Green Care Guide, Embedded Care Coordination [place], Care Management Phone: [phone] Email: [email or web address]

## 2020-09-12 ENCOUNTER — Ambulatory Visit (INDEPENDENT_AMBULATORY_CARE_PROVIDER_SITE_OTHER): Payer: Medicare HMO

## 2020-09-12 ENCOUNTER — Ambulatory Visit (INDEPENDENT_AMBULATORY_CARE_PROVIDER_SITE_OTHER): Payer: Medicare HMO | Admitting: Acute Care

## 2020-09-12 ENCOUNTER — Other Ambulatory Visit: Payer: Self-pay

## 2020-09-12 ENCOUNTER — Encounter: Payer: Self-pay | Admitting: Acute Care

## 2020-09-12 DIAGNOSIS — Z87891 Personal history of nicotine dependence: Secondary | ICD-10-CM

## 2020-09-12 NOTE — Progress Notes (Signed)
Virtual Visit via Telephone Note  I connected with Shane Carrillo on 09/12/20 at  9:00 AM EDT by telephone and verified that I am speaking with the correct person using two identifiers.  Location: Patient: At home Provider: [place], [place], [place], [address]    I discussed the limitations, risks, security and privacy concerns of performing an evaluation and management service by telephone and the availability of in person appointments. I also discussed with the patient that there may be a patient responsible charge related to this service. The patient expressed understanding and agreed to proceed.   Shared Decision Making Visit Lung Cancer Screening Program [phone])   Eligibility: Age 75 y.o. Pack Years Smoking History Calculation 103 pack year smoking history (# packs/per year x # years smoked) Recent History of coughing up blood  no Unexplained weight loss? no ( >Than 15 pounds within the last 6 months ) Prior History Lung / other cancer no (Diagnosis within the last 5 years already requiring surveillance chest CT Scans). Smoking Status Former Smoker Former Smokers: Years since quit: < 1 year  Quit Date: 07/2020  Visit Components: Discussion included one or more decision making aids. yes Discussion included risk/benefits of screening. yes Discussion included potential follow up diagnostic testing for abnormal scans. yes Discussion included meaning and risk of over diagnosis. yes Discussion included meaning and risk of False Positives. yes Discussion included meaning of total radiation exposure. yes  Counseling Included: Importance of adherence to annual lung cancer LDCT screening. yes Impact of comorbidities on ability to participate in the program. yes Ability and willingness to under diagnostic treatment. yes  Smoking Cessation Counseling: Current Smokers:  Discussed importance of smoking cessation. yes Information about tobacco cessation classes and  interventions provided to patient. yes Patient provided with "ticket" for LDCT Scan. yes Symptomatic Patient. no  Counseling NA Diagnosis Code: Tobacco Use Z72.0 Asymptomatic Patient yes  Counseling (Intermediate counseling: > three minutes counseling) [id number] Former Smokers:  Discussed the importance of maintaining cigarette abstinence. yes Diagnosis Code: Personal History of Nicotine Dependence. [id number] Information about tobacco cessation classes and interventions provided to patient. Yes Patient provided with "ticket" for LDCT Scan. yes Written Order for Lung Cancer Screening with LDCT placed in Epic. Yes (CT Chest Lung Cancer Screening Low Dose W/O CM) [id number] Z12.2-Screening of respiratory organs Z87.891-Personal history of nicotine dependence  I spent 25 minutes of face to face time with Shane Carrillo discussing the risks and benefits of lung cancer screening. We viewed a power point together that explained in detail the above noted topics. We took the time to pause the power point at intervals to allow for questions to be asked and answered to ensure understanding. We discussed that he had taken the single most powerful action possible to decrease his risk of developing lung cancer when he quit smoking. I counseled him to remain smoke free, and to contact me if he ever had the desire to smoke again so that I can provide resources and tools to help support the effort to remain smoke free. We discussed the time and location of the scan, and that either  [name] or I will call with the results within  24-48 hours of receiving them. He has my card and contact information in the event he needs to speak with me, in addition to a copy of the power point we reviewed as a resource. He verbalized understanding of all of the above and had no further questions upon leaving  the office.     I explained to the patient that there has been a high incidence of coronary artery disease noted on these  exams. I explained that this is a non-gated exam therefore degree or severity cannot be determined. This patient is currently on statin therapy. I have asked the patient to follow-up with their PCP regarding any incidental finding of coronary artery disease and management with diet or medication as they feel is clinically indicated. The patient verbalized understanding of the above and had no further questions.     [name], NP 09/12/2020 9:27 AM

## 2020-09-12 NOTE — Patient Instructions (Signed)
Thank you for participating in the [facility] Lung Cancer Screening Program. It was our pleasure to meet you today. We will call you with the results of your scan within the next few days. Your scan will be assigned a Lung RADS category score by the physicians reading the scans.  This Lung RADS score determines follow up scanning.  See below for description of categories, and follow up screening recommendations. We will be in touch to schedule your follow up screening annually or based on recommendations of our providers. We will fax a copy of your scan results to your Primary Care Physician, or the physician who referred you to the program, to ensure they have the results. Please call the office if you have any questions or concerns regarding your scanning experience or results.  Our office number is [phone]. Please speak with Denise Phelps, RN. She is our Lung Cancer Screening RN. If she is unavailable when you call, please have the office staff send her a message. She will return your call at her earliest convenience. Remember, if your scan is normal, we will scan you annually as long as you continue to meet the criteria for the program. (Age 55-77, Current smoker or smoker who has quit within the last 15 years). If you are a smoker, remember, quitting is the single most powerful action that you can take to decrease your risk of lung cancer and other pulmonary, breathing related problems. We know quitting is hard, and we are here to help.  Please let us know if there is anything we can do to help you meet your goal of quitting. If you are a former smoker, congratulations. We are proud of you! Remain smoke free! Remember you can refer friends or family members through the number above.  We will screen them to make sure they meet criteria for the program. Thank you for helping us take better care of you by participating in Lung Screening.  Lung RADS Categories:  Lung RADS 1: no nodules  or definitely non-concerning nodules.  Recommendation is for a repeat annual scan in 12 months.  Lung RADS 2:  nodules that are non-concerning in appearance and behavior with a very low likelihood of becoming an active cancer. Recommendation is for a repeat annual scan in 12 months.  Lung RADS 3: nodules that are probably non-concerning , includes nodules with a low likelihood of becoming an active cancer.  Recommendation is for a 6-month repeat screening scan. Often noted after an upper respiratory illness. We will be in touch to make sure you have no questions, and to schedule your 6-month scan.  Lung RADS 4 A: nodules with concerning findings, recommendation is most often for a follow up scan in 3 months or additional testing based on our provider's assessment of the scan. We will be in touch to make sure you have no questions and to schedule the recommended 3 month follow up scan.  Lung RADS 4 B:  indicates findings that are concerning. We will be in touch with you to schedule additional diagnostic testing based on our provider's  assessment of the scan.   

## 2020-09-13 ENCOUNTER — Telehealth: Payer: Self-pay | Admitting: *Deleted

## 2020-09-13 ENCOUNTER — Telehealth: Payer: Self-pay | Admitting: Family Medicine

## 2020-09-13 NOTE — Telephone Encounter (Signed)
   Telephone encounter was:  Unsuccessful.  09/13/2020 Name: MICHOLAS KRUMHOLZ MRN: [id number] DOB: 01-13-1946  Unsuccessful outbound call made today to assist with:  Food Insecurity  Outreach Attempt:  1st Attempt  Calling patient to verify some information needed for MOW. Mailbox was full, unable to leave a message for a call back.   April Green Care Guide, Embedded Care Coordination [place], Care Management Phone: [phone] Email: [email or web address]

## 2020-09-13 NOTE — Chronic Care Management (AMB) (Signed)
  Chronic Care Management   Note  09/13/2020 Name: Shane Carrillo MRN: [id number] DOB: [birth year]  [name] is a 75 y.o. year old male who is a primary care patient of [name], DO. I reached out to Shane Carrillo by phone today in response to a referral sent by Mr. Gari Hartsell Metzer's PCP, [name], DO      [name] was given information about Chronic Care Management services today including:  CCM service includes personalized support from designated clinical staff supervised by his physician, including individualized plan of care and coordination with other care providers 24/7 contact phone numbers for assistance for urgent and routine care needs. Service will only be billed when office clinical staff spend 20 minutes or more in a month to coordinate care. Only one practitioner may furnish and bill the service in a calendar month. The patient may stop CCM services at any time (effective at the end of the month) by phone call to the office staff. The patient will be responsible for cost sharing (co-pay) of up to 20% of the service fee (after annual deductible is met).  Patient agreed to services and verbal consent obtained.   Follow up plan: Telephone appointment with care management team member scheduled for:10/03/20  Kandas Oliveto  Care Guide, Embedded Care Coordination [place]  Care Management  Direct Dial: [phone]

## 2020-09-14 ENCOUNTER — Other Ambulatory Visit: Payer: Self-pay | Admitting: Family Medicine

## 2020-09-14 DIAGNOSIS — N529 Male erectile dysfunction, unspecified: Secondary | ICD-10-CM

## 2020-09-17 ENCOUNTER — Telehealth: Payer: Self-pay | Admitting: Family Medicine

## 2020-09-17 NOTE — Telephone Encounter (Signed)
   Telephone encounter was:  Unsuccessful.  09/17/2020 Name: Shane Carrillo MRN: [id number] DOB: [birth year]  Unsuccessful outbound call made today to assist with:  Food Insecurity  Outreach Attempt:  2nd Attempt  Calling patient to verify some information needed for MOW. Mailbox was full, unable to leave a message for a call back.  April Green Care Guide, Embedded Care Coordination [place], Care Management Phone: [phone] Email: [email or web address]

## 2020-09-19 ENCOUNTER — Ambulatory Visit (INDEPENDENT_AMBULATORY_CARE_PROVIDER_SITE_OTHER): Payer: Medicare HMO | Admitting: Family Medicine

## 2020-09-19 ENCOUNTER — Encounter: Payer: Self-pay | Admitting: Family Medicine

## 2020-09-19 ENCOUNTER — Other Ambulatory Visit: Payer: Self-pay

## 2020-09-19 DIAGNOSIS — J410 Simple chronic bronchitis: Secondary | ICD-10-CM | POA: Diagnosis not present

## 2020-09-19 DIAGNOSIS — E782 Mixed hyperlipidemia: Secondary | ICD-10-CM

## 2020-09-19 DIAGNOSIS — I739 Peripheral vascular disease, unspecified: Secondary | ICD-10-CM | POA: Diagnosis not present

## 2020-09-19 DIAGNOSIS — N529 Male erectile dysfunction, unspecified: Secondary | ICD-10-CM

## 2020-09-19 DIAGNOSIS — I10 Essential (primary) hypertension: Secondary | ICD-10-CM | POA: Diagnosis not present

## 2020-09-19 MED ORDER — SILDENAFIL CITRATE 100 MG PO TABS
ORAL_TABLET | ORAL | 2 refills | Status: DC
Start: 2020-09-19 — End: 2021-01-21

## 2020-09-19 MED ORDER — ALBUTEROL SULFATE HFA 108 (90 BASE) MCG/ACT IN AERS: 2.0000 | INHALATION_SPRAY | Freq: Four times a day (QID) | RESPIRATORY_TRACT | 3 refills | Status: DC | PRN

## 2020-09-19 MED ORDER — TRELEGY ELLIPTA 100-62.5-25 MCG/INH IN AEPB: INHALATION_SPRAY | RESPIRATORY_TRACT | 6 refills | Status: DC

## 2020-09-19 NOTE — Progress Notes (Signed)
Shane Carrillo - 75 y.o. male MRN [id number]  Date of birth: 08-Nov-1945  Subjective Chief Complaint  Patient presents with   Hypertension    HPI Shane Carrillo is a 75 year old male here today for follow-up visit.  Blood pressure is much better controlled at this time.  He is taking losartan and amlodipine.  Tolerating well without side effects.  Continues to use Trelegy daily with albuterol as needed for management of COPD.  He did have recent CT scan for lung cancer screening.  This is stable compared to previous exam.  He does report that his breathing has improved.  He has quit smoking as well.  Tolerating rosuvastatin well for management of hyperlipidemia.  Has history of CAD and peripheral arterial disease.  ROS:  A comprehensive ROS was completed and negative except as noted per HPI  Allergies  Allergen Reactions   Atorvastatin Itching   Sildenafil Palpitations   Simvastatin Itching   Aspirin Other (See Comments)    GI upset - Pt states that he is able to take the coated form     Past Medical History:  Diagnosis Date   Aortic ectasia, abdominal ([place]) 08/2016   On medicare screening u/s: Abdominal aortic atherosclerosis and mild ectasia. Maximal diameter 2.8 cm-->repeat u/s 5 yrs.   BPH with obstruction/lower urinary tract symptoms    Chronic cough    upper airway cough syndrome/irritable larynx syndrome->Dr. Melvyn Novas.   COPD (chronic obstructive pulmonary disease) (HCC)    Coronary artery disease    Stent.     DOE (dyspnea on exertion)    spirometry 02/2018 normal, but needs full PFT's per Dr. Melvyn Novas 03/2018.  No bronchodilators indicated as of 03/2018.   GERD (gastroesophageal reflux disease)    Dysphagia   Hypercholesterolemia    Intol of atorva and simva   Hypertension    PAD (peripheral artery disease) (HCC)    Abd aortic athero   Tobacco dependence    Unintentional weight loss 2019   +Dysphagia,dec appetite/abd pain->pt was set to get EGD/colonoscopy late 2019 but he  never followed up with GI to get this.    Past Surgical History:  Procedure Laterality Date   ABDOMINAL AORTOGRAM W/LOWER EXTREMITY N/A 02/24/2019   Procedure: ABDOMINAL AORTOGRAM W/LOWER EXTREMITY;  Surgeon: Lorretta Harp, MD;  Location: [facility];  Service: Cardiovascular;  Laterality: N/A;   CORONARY ANGIOPLASTY WITH STENT PLACEMENT     Dr. Ouida Sills in [place], [place].   FOOT SURGERY     PERIPHERAL VASCULAR INTERVENTION Left 02/24/2019   Procedure: PERIPHERAL VASCULAR INTERVENTION;  Surgeon: Lorretta Harp, MD;  Location: [facility];  Service: Cardiovascular;  Laterality: Left;   ROTATOR CUFF REPAIR      Social History   Socioeconomic History   Marital status: Divorced    Spouse name: Not on file   Number of children: Not on file   Years of education: 12th grade   Highest education level: Not on file  Occupational History    Comment: Retired  Tobacco Use   Smoking status: Former    Packs/day: 1.75    Years: 59.00    Pack years: 103.25    Types: Cigarettes    Quit date: 07/18/2020    Years since quitting: 0.1   Smokeless tobacco: Never  Vaping Use   Vaping Use: Never used  Substance and Sexual Activity   Alcohol use: Not Currently   Drug use: No   Sexual activity: Not on file  Other Topics Concern  Not on file  Social History Narrative   Lives alone. He has 12 grown children. He enjoys going [place] race track and the [place].   Social Determinants of Health   Financial Resource Strain: Medium Risk   Difficulty of Paying Living Expenses: Somewhat hard  Food Insecurity: Food Insecurity Present   Worried About [place] in the Last Year: Sometimes true   Ran Out of Food in the Last Year: Sometimes true  Transportation Needs: No Transportation Needs   Lack of Transportation (Medical): No   Lack of Transportation (Non-Medical): No  Physical Activity: Insufficiently Active   Days of Exercise per Week: 3 days   Minutes of Exercise per  Session: 30 min  Stress: No Stress Concern Present   Feeling of Stress : Only a little  Social Connections: Socially Isolated   Frequency of Communication with Friends and Family: Three times a week   Frequency of Social Gatherings with Friends and Family: Once a week   Attends Religious Services: Never   Marine scientist or Organizations: No   Attends Music therapist: Never   Marital Status: Divorced    Family History  Problem Relation Age of Onset   Cancer - Cervical Mother    Colon cancer Mother    Colon cancer Father    Prostate cancer Father    Diabetes Maternal Grandmother     Health Maintenance  Topic Date Due   COVID-19 Vaccine (1) Never done   Zoster Vaccines- Shingrix (1 of 2) 11/21/2020 (Originally 01/05/1997)   TETANUS/TDAP  06/19/2021 (Originally 01/07/2020)   Fecal DNA (Cologuard)  06/19/2021 (Originally 03/21/2019)   Hepatitis C Screening  08/21/2021 (Originally 01/05/1965)   HPV VACCINES  Aged Out   INFLUENZA VACCINE  Discontinued   PNA vac Low Risk Adult  Discontinued     ----------------------------------------------------------------------------------------------------------------------------------------------------------------------------------------------------------------- Physical Exam BP 131/75 (BP Location: Left Arm, Patient Position: Sitting, Cuff Size: Small)   Pulse 78   Temp 97.9 F (36.6 C)   Ht '5\' 5"'$  (1.651 m)   Wt 139 lb 12.8 oz (63.4 kg)   SpO2 97%   BMI 23.26 kg/m   Physical Exam Constitutional:      Appearance: Normal appearance.  Eyes:     General: No scleral icterus. Cardiovascular:     Rate and Rhythm: Normal rate and regular rhythm.  Pulmonary:     Effort: Pulmonary effort is normal.     Breath sounds: Normal breath sounds.  Musculoskeletal:     Cervical back: Neck supple.  Skin:    General: Skin is warm and dry.  Neurological:     General: No focal deficit present.     Mental Status: He is alert.   Psychiatric:        Mood and Affect: Mood normal.        Behavior: Behavior normal.    ------------------------------------------------------------------------------------------------------------------------------------------------------------------------------------------------------------------- Assessment and Plan  Essential hypertension Rossie's blood pressure is well controlled at this time.  I recommend continuation of current medications for management of hypertension.  Low-sodium diet recommended.  Congratulated on quitting smoking  Peripheral arterial disease (HCC) Continue clopidogrel and statin.  COPD (chronic obstructive pulmonary disease) (HCC) Respiratory symptoms have improved with Trelegy.  Recommend continuation of this daily.  He may continue albuterol as needed.  I discussed with him the importance of remaining quit from smoking.  Hyperlipemia, mixed Recommend continuation of Crestor.  Erectile dysfunction Continue sildenafil as needed.   Meds ordered this encounter  Medications  sildenafil (VIAGRA) 100 MG tablet    Sig: TAKE ONE TABLET BY MOUTH DAILY AS NEEDED FOR ERECTILE DYSFUNCTION    Dispense:  10 tablet    Refill:  2   Fluticasone-Umeclidin-Vilant (TRELEGY ELLIPTA) 100-62.5-25 MCG/INH AEPB    Sig: One inhalation once daily.    Dispense:  1 each    Refill:  6   albuterol (PROAIR HFA) 108 (90 Base) MCG/ACT inhaler    Sig: Inhale 2 puffs into the lungs every 6 (six) hours as needed for wheezing or shortness of breath.    Dispense:  8 g    Refill:  3    Return in about 4 months (around 01/19/2021) for HTN/COPD.    This visit occurred during the SARS-CoV-2 public health emergency.  Safety protocols were in place, including screening questions prior to the visit, additional usage of staff PPE, and extensive cleaning of exam room while observing appropriate contact time as indicated for disinfecting solutions.

## 2020-09-19 NOTE — Assessment & Plan Note (Signed)
Respiratory symptoms have improved with Trelegy.  Recommend continuation of this daily.  He may continue albuterol as needed.  I discussed with him the importance of remaining quit from smoking.

## 2020-09-19 NOTE — Assessment & Plan Note (Signed)
Continue sildenafil as needed. 

## 2020-09-19 NOTE — Assessment & Plan Note (Signed)
Shane Carrillo's blood pressure is well controlled at this time.  I recommend continuation of current medications for management of hypertension.  Low-sodium diet recommended.  Congratulated on quitting smoking

## 2020-09-19 NOTE — Assessment & Plan Note (Signed)
Recommend continuation of Crestor.

## 2020-09-19 NOTE — Assessment & Plan Note (Signed)
Continue clopidogrel and statin.

## 2020-09-27 NOTE — Progress Notes (Signed)
Please call patient and let them  know their  low dose Ct was read as a Lung RADS 2: nodules that are benign in appearance and behavior with a very low likelihood of becoming a clinically active cancer due to size or lack of growth. Recommendation per radiology is for a repeat LDCT in 12 months. .Please let them  know we will order and schedule their  annual screening scan for 08/2021. Please let them  know there was notation of CAD on their  scan.  Please remind the patient  that this is a non-gated exam therefore degree or severity of disease  cannot be determined. Please have them  follow up with their PCP regarding potential risk factor modification, dietary therapy or pharmacologic therapy if clinically indicated. Pt.  is  currently on statin therapy. Please place order for annual  screening scan for  08/2021 and fax results to PCP. Thanks so much.  Let them know there was notation of resolving pneumonia, and that ascending aorta aneurysm is stable. Please have him follow up with PCP regarding need for follow up with annual CTA or MRA . Thanks

## 2020-10-03 ENCOUNTER — Ambulatory Visit (INDEPENDENT_AMBULATORY_CARE_PROVIDER_SITE_OTHER): Payer: Medicare HMO

## 2020-10-03 DIAGNOSIS — I10 Essential (primary) hypertension: Secondary | ICD-10-CM | POA: Diagnosis not present

## 2020-10-03 DIAGNOSIS — J441 Chronic obstructive pulmonary disease with (acute) exacerbation: Secondary | ICD-10-CM

## 2020-10-03 DIAGNOSIS — J1282 Pneumonia due to coronavirus disease 2019: Secondary | ICD-10-CM

## 2020-10-03 NOTE — Patient Instructions (Signed)
Visit Information   PATIENT GOALS:   Goals Addressed             This Visit's Progress    Track and Manage My Symptoms - chronic health conditions       Timeframe:  Long-Range Goal Priority:  High Start Date: 10/03/20                            Expected End Date:   04/05/21                    Follow Up Date 10/24/20    Patient Goals: - eat healthy: whole grains, fruits and vegetables, lean meats, healthy fats, monitor salt intake - develop a rescue plan and follow rescue plan if symptoms flare-up - eliminate symptom triggers at home - continue smoking cessation strategies - review calendar/organizer and education on smoking cessation, COPD action plan. Plan to discuss at next telephone call. - call your insurance provider re: blood pressure cuff, or you can get a blood pressure monitor at you local pharmacy. - expect a call from the care guide regarding housing resources   Why is this important?   Tracking your symptoms and other information about your health helps your doctor plan your care.  Write down the symptoms, the time of day, what you were doing and what medicine you are taking.  You will soon learn how to manage your symptoms.          Consent to CCM Services: Shane Carrillo was given information about Chronic Care Management services including:  CCM service includes personalized support from designated clinical staff supervised by his physician, including individualized plan of care and coordination with other care providers 24/7 contact phone numbers for assistance for urgent and routine care needs. Service will only be billed when office clinical staff spend 20 minutes or more in a month to coordinate care. Only one practitioner may furnish and bill the service in a calendar month. The patient may stop CCM services at any time (effective at the end of the month) by phone call to the office staff. The patient will be responsible for cost sharing (co-pay) of up to 20%  of the service fee (after annual deductible is met).  Patient agreed to services and verbal consent obtained.   The patient verbalized understanding of instructions, educational materials, and care plan provided today and agreed to receive a mailed copy of patient instructions, educational materials, and care plan.   Telephone follow up appointment with care management team member scheduled for: 10/24/20 The patient has been provided with contact information for the care management team and has been advised to call with any health related questions or concerns.   Thea Silversmith, RN, MSN, BSN, CCM Care Management Coordinator [facility] [phone]   CLINICAL CARE PLAN: Patient Care Plan: Chronic Conditions     Problem Identified: Long term care plan for self-management of COPD in a patient with recent covid pneumonia and h/o HTN, HLD, PAD, GERD, STEMI   Priority: High     Long-Range Goal: Symptom Exacerbation Prevented or Minimized   Start Date: 10/03/2020  Expected End Date: 04/05/2021  Priority: High  Note:   Objective:  Last practice recorded BP readings:  BP Readings from Last 3 Encounters:  09/19/20 131/75  08/21/20 138/78  08/06/20 139/76  Most recent lipid panel:     Component Value Date/Time   CHOL 172 06/16/2019 1905  CHOL 186 01/05/2019 1046   TRIG 85 06/16/2019 1905   HDL 85 06/16/2019 1905   HDL 76 01/05/2019 1046   CHOLHDL 2.0 06/16/2019 1905   VLDL 17 06/16/2019 1905   LDLCALC 70 06/16/2019 1905   LDLCALC 97 01/05/2019 1046  Current Barriers:  Knowledge deficits related to long term care plan for self-management of COPD in a patient with a history of HTN, HLD, CAD, PAD,GERD. Denies any signs/symptoms of exacerbation at this time. Acknowledges can get SOB on exertion and spaces out activities throughout the day. Noted Covid with Covid pneumonia June 2022. Former smoker.  Case Manager Clinical Goal(s): patient will report using inhalers as  prescribed including rinsing mouth after use patient will verbalize understanding of COPD action plan and when to seek appropriate levels of medical care  Patient will obtain Blood Pressure monitor and begin to monitor blood pressure Patient will demonstrate improved adherence to prescribed treatment plan for hypertension as evidenced by taking medications as prescribed, monitoring and recording blood pressure as directed, adhering to low sodium/DASH diet. Interventions:  Collaboration with [name], DO regarding development and update of comprehensive plan of care as evidenced by provider attestation and co-signature Inter-disciplinary care team collaboration (see longitudinal plan of care) Provided patient with COPD action plan and reinforced importance of daily self assessment Provided instruction about proper use of medications used for management of COPD including inhalers Medications reviewed with patient. Reinforced difference between maintenance inhaler and rescue inhaler.  Reviewed upcoming/scheduled appointments. Client reports he has an appointment with pulmonology on Friday 10/05/20. Discussed smoking cessation and provided smoking cessation information to support his efforts. Provide Calendar/Organizer which includes COPD action plan and HTN management tool. Referral to care guide for local resources for one level home. Patient reports is living in two story home now and it difficult to go up and down stairs. Self-Care Activities:  Attends scheduled provider appointments Calls pharmacy for medication refills Calls provider office for new concerns or questions Patient Goals: - eat healthy: whole grains, fruits and vegetables, lean meats, healthy fats, monitor salt intake - develop a rescue plan and follow rescue plan if symptoms flare-up - eliminate symptom triggers at home - continue smoking cessation strategies - review calendar/organizer and education on smoking cessation,  COPD action plan. Plan to discuss at next telephone call. - call your insurance provider re: blood pressure cuff, or you can get a blood pressure monitor at you local pharmacy. - expect a call from the care guide regarding housing resources Follow Up Plan: The patient has been provided with contact information for the care management team and has been advised to call with any health related questions or concerns.  The care management team will reach out to the patient again over the next 30 days.

## 2020-10-03 NOTE — Chronic Care Management (AMB) (Signed)
Chronic Care Management   CCM RN Visit Note  10/03/2020 Name: Shane Carrillo MRN: [id number] DOB: April 12, 1945  Subjective: Shane Carrillo is a 75 y.o. year old male who is a primary care patient of [name], DO. The care management team was consulted for assistance with disease management and care coordination needs.    Engaged with patient by telephone for initial visit in response to provider referral for case management and/or care coordination services.   Consent to Services:  The patient was given the following information about Chronic Care Management services today, agreed to services, and gave verbal consent: 1. CCM service includes personalized support from designated clinical staff supervised by the primary care provider, including individualized plan of care and coordination with other care providers 2. 24/7 contact phone numbers for assistance for urgent and routine care needs. 3. Service will only be billed when office clinical staff spend 20 minutes or more in a month to coordinate care. 4. Only one practitioner may furnish and bill the service in a calendar month. 5.The patient may stop CCM services at any time (effective at the end of the month) by phone call to the office staff. 6. The patient will be responsible for cost sharing (co-pay) of up to 20% of the service fee (after annual deductible is met). Patient agreed to services and consent obtained.  Patient agreed to services and verbal consent obtained.   Assessment: Review of patient past medical history, allergies, medications, health status, including review of consultants reports, laboratory and other test data, was performed as part of comprehensive evaluation and provision of chronic care management services.   SDOH (Social Determinants of Health) assessments and interventions performed:  SDOH Interventions    Flowsheet Row Most Recent Value  SDOH Interventions   Food Insecurity Interventions Intervention  Not Indicated  [patient states currently this is not an issue]  Transportation Interventions Intervention Not Indicated        CCM Care Plan  Allergies  Allergen Reactions   Atorvastatin Itching   Sildenafil Palpitations   Simvastatin Itching   Aspirin Other (See Comments)    GI upset - Pt states that he is able to take the coated form     Outpatient Encounter Medications as of 10/03/2020  Medication Sig   albuterol (PROAIR HFA) 108 (90 Base) MCG/ACT inhaler Inhale 2 puffs into the lungs every 6 (six) hours as needed for wheezing or shortness of breath.   aspirin EC 325 MG tablet Take 325 mg by mouth daily.   baclofen (LIORESAL) 10 MG tablet Take 1 tablet (10 mg total) by mouth 3 (three) times daily as needed for muscle spasms.   clopidogrel (PLAVIX) 75 MG tablet Take 75 mg by mouth daily.   docusate sodium (COLACE) 100 MG capsule Take 1 capsule (100 mg total) by mouth 2 (two) times daily. (Patient taking differently: Take 200 mg by mouth every other day.)   famotidine (PEPCID) 40 MG tablet Take 40 mg by mouth daily as needed for heartburn or indigestion.   fluticasone (FLONASE) 50 MCG/ACT nasal spray Place 2 sprays into both nostrils daily.   Fluticasone-Umeclidin-Vilant (TRELEGY ELLIPTA) 100-62.5-25 MCG/INH AEPB One inhalation once daily.   losartan (COZAAR) 50 MG tablet Take 1 tablet (50 mg total) by mouth daily.   mirtazapine (REMERON) 15 MG tablet Take 1 tablet (15 mg total) by mouth at bedtime.   Omega-3 Fatty Acids (FISH OIL) 1000 MG CAPS Take 1 g by mouth daily.   ondansetron (ZOFRAN-ODT) 4  MG disintegrating tablet Take 4 mg by mouth every 8 (eight) hours as needed for nausea/vomiting.   potassium chloride (K-DUR) 10 MEQ tablet Take 1 tablet (10 mEq total) by mouth daily. (Patient taking differently: Take 10 mEq by mouth See admin instructions. Three times a week)   sildenafil (VIAGRA) 100 MG tablet TAKE ONE TABLET BY MOUTH DAILY AS NEEDED FOR ERECTILE DYSFUNCTION   tamsulosin  (FLOMAX) 0.4 MG CAPS capsule Take 1 capsule (0.4 mg total) by mouth daily after supper.   amLODipine (NORVASC) 10 MG tablet Take 1 tablet (10 mg total) by mouth daily.   lidocaine (XYLOCAINE) 2 % jelly Apply topically tid (plz sched with new provider for future fills) (Patient not taking: Reported on 10/03/2020)   rosuvastatin (CRESTOR) 40 MG tablet Take 1 tablet (40 mg total) by mouth daily.   No facility-administered encounter medications on file as of 10/03/2020.    Patient Active Problem List   Diagnosis Date Noted   Pneumonia due to COVID-19 virus 08/06/2020   COPD with exacerbation ([place]) 06/10/2020   Head injury 01/02/2020   Acute pain of right knee 01/02/2020   COPD (chronic obstructive pulmonary disease) ([place]) 12/16/2019   New onset of headaches 11/20/2019   Chest pain, rule out acute myocardial infarction 06/16/2019   Allergic rhinitis 06/03/2019   Rectal bleeding 02/07/2019   Mid back pain 02/01/2019   Peripheral arterial disease ([place]) 01/26/2019   Inflamed external hemorrhoid 11/23/2018   Essential hypertension 11/02/2018   Benign prostatic hyperplasia with urinary frequency 11/02/2018   Abnormal TSH 11/02/2018   CAD (coronary artery disease) 11/02/2018   Acute coronary syndrome ([place]) 10/09/2018   Cigarette smoker 02/26/2018   DOE (dyspnea on exertion) 02/25/2018   Abdominal aortic ectasia ([place]) 08/28/2016   Dysphagia 08/12/2016   Mild intermittent asthma without complication 76/28/3151   Erectile dysfunction 07/30/2009   Hyperlipemia, mixed 01/11/2007    Conditions to be addressed/monitored:CAD, HTN, HLD, and COPD  Care Plan : Chronic Conditions  Updates made by Luretha Rued, RN since 10/03/2020 12:00 AM     Problem: Long term care plan for self-management of COPD in a patient with h/o HTN, HLD, PAD, GERD, STEMI   Priority: Medium     Long-Range Goal: Symptom Exacerbation Prevented or Minimized   Start Date: 10/03/2020  Expected End Date: 04/05/2021   Priority: Medium  Note:   Objective:  Last practice recorded BP readings:  BP Readings from Last 3 Encounters:  09/19/20 131/75  08/21/20 138/78  08/06/20 139/76  Most recent lipid panel:     Component Value Date/Time   CHOL 172 06/16/2019 1905   CHOL 186 01/05/2019 1046   TRIG 85 06/16/2019 1905   HDL 85 06/16/2019 1905   HDL 76 01/05/2019 1046   CHOLHDL 2.0 06/16/2019 1905   VLDL 17 06/16/2019 1905   LDLCALC 70 06/16/2019 1905   LDLCALC 97 01/05/2019 1046  Current Barriers:  Knowledge deficits related to long term care plan for self-management of COPD in a patient with a history of HTN, HLD, CAD, PAD,GERD. Denies any signs/symptoms of exacerbation at this time. Acknowledges can get SOB on exertion and spaces out activities throughout the day. Case Manager Clinical Goal(s): patient will report using inhalers as prescribed including rinsing mouth after use patient will verbalize understanding of COPD action plan and when to seek appropriate levels of medical care  Patient will obtain Blood Pressure monitor and begin to monitor blood pressure Patient will demonstrate improved adherence to prescribed treatment plan for  hypertension as evidenced by taking medications as prescribed, monitoring and recording blood pressure as directed, adhering to low sodium/DASH diet. Interventions:  Collaboration with [name], DO regarding development and update of comprehensive plan of care as evidenced by provider attestation and co-signature Inter-disciplinary care team collaboration (see longitudinal plan of care) Provided patient with COPD action plan and reinforced importance of daily self assessment Provided instruction about proper use of medications used for management of COPD including inhalers Medications reviewed with patient. Reinforced difference between maintenance inhaler and rescue inhaler.  Discussed smoking cessation and provided smoking cessation information to support his  efforts. Provide Calendar/Organizer which includes COPD action plan and HTN management tool. Referral to care guide for local resources for one level home. Patient reports is living in two story home now and it difficult to go up and down stairs. Self-Care Activities:  Attends scheduled provider appointments Calls pharmacy for medication refills Calls provider office for new concerns or questions Patient Goals: - eat healthy: whole grains, fruits and vegetables, lean meats, healthy fats, monitor salt intake - develop a rescue plan and follow rescue plan if symptoms flare-up - eliminate symptom triggers at home - continue smoking cessation strategies - review calendar/organizer and education on smoking cessation, COPD action plan. Plan to discuss at next telephone call. - call your insurance provider re: blood pressure cuff, or you can get a blood pressure monitor at you local pharmacy. - expect a call from the care guide regarding housing resources Follow Up Plan: The patient has been provided with contact information for the care management team and has been advised to call with any health related questions or concerns.  The care management team will reach out to the patient again over the next 30 days.       Plan:The patient has been provided with contact information for the care management team and has been advised to call with any health related questions or concerns.  and The care management team will reach out to the patient again over the next 30 days.  Thea Silversmith, RN, MSN, BSN, CCM Care Management Coordinator [facility] [phone]

## 2020-10-09 IMAGING — MR MR HEAD W/O CM
10 series · 48 of 48 positions shown · non-contrast
Comparison: Head CT 01/13/2016

CLINICAL DATA: New or worsening headache extending to the neck.

EXAM:
MRI HEAD WITHOUT CONTRAST
TECHNIQUE: Multiplanar, multiecho pulse sequences of the brain and surrounding
structures were obtained without intravenous contrast.

[Series 3: DWI · axial · 3.0mm · 1.20mm/px · z∈[-14,+146]mm · 6 of 55 slices shown (1 of 2)]
[im 1/55]
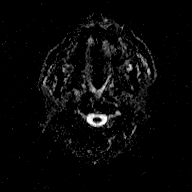
[im 11/55]
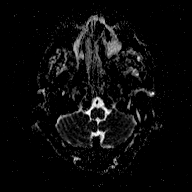
[im 22/55]
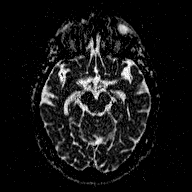
[im 33/55]
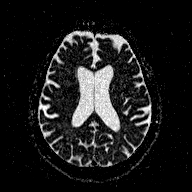
[im 44/55]
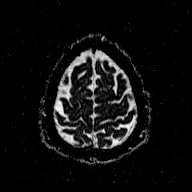
[im 55/55]
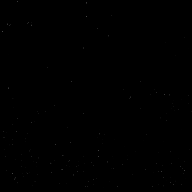

[Series 5: DWI · coronal · 3.0mm · 1.15mm/px · 4 of 45 slices shown (2 of 2)]
[im 1/45]
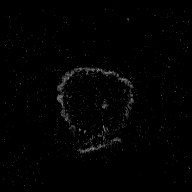
[im 15/45]
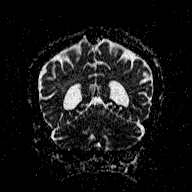
[im 30/45]
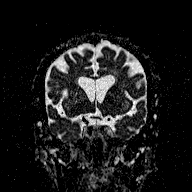
[im 45/45]
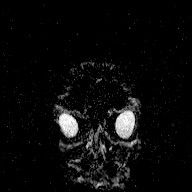

[Series 6: T1 · sagittal · 5.0mm · 0.45mm/px · 2 of 23 slices shown (1 of 2)]
[im 1/23]
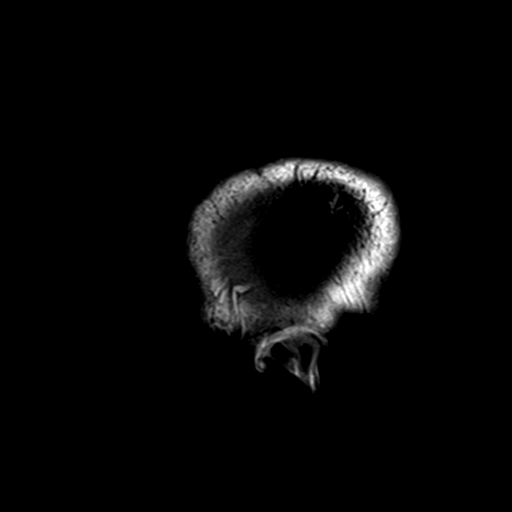
[im 23/23]
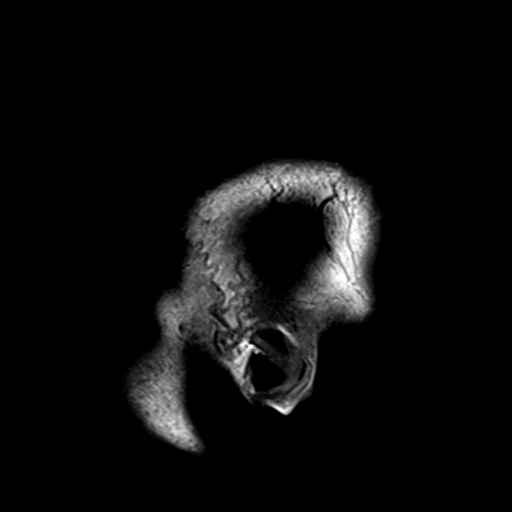

[Series 7: T2 · axial · 5.0mm · 0.72mm/px · z∈[-15,+138]mm · 2 of 23 slices shown (1 of 3)]
[im 1/23]
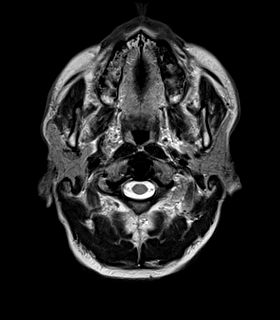
[im 23/23]
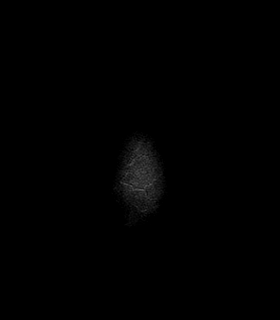

[Series 8: FLAIR · axial · 3.0mm · 0.45mm/px · z∈[-19,+141]mm · 5 of 55 slices shown]
[im 1/55]
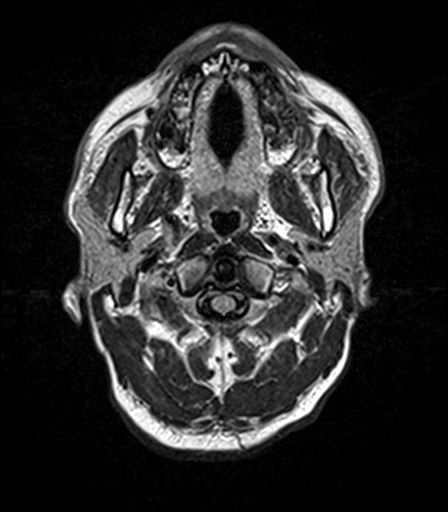
[im 14/55]
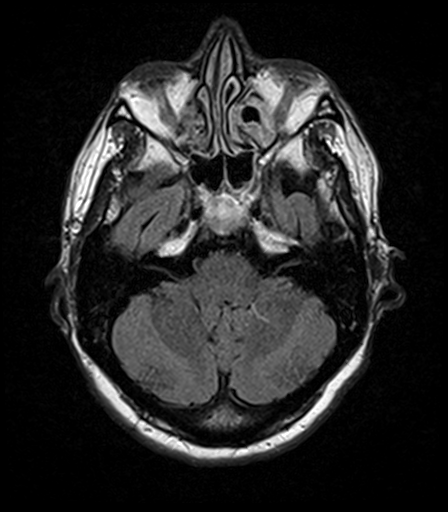
[im 28/55]
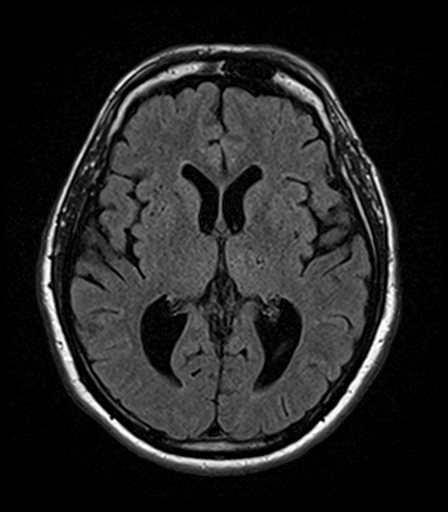
[im 41/55]
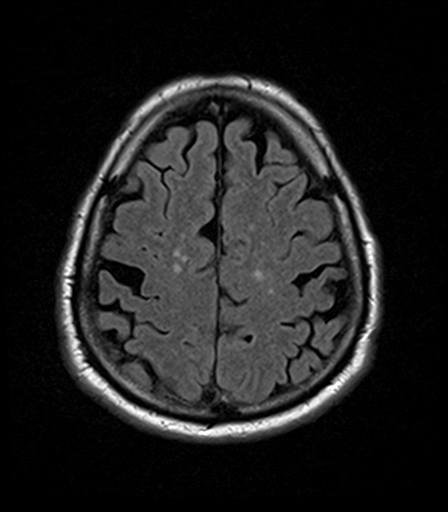
[im 55/55]
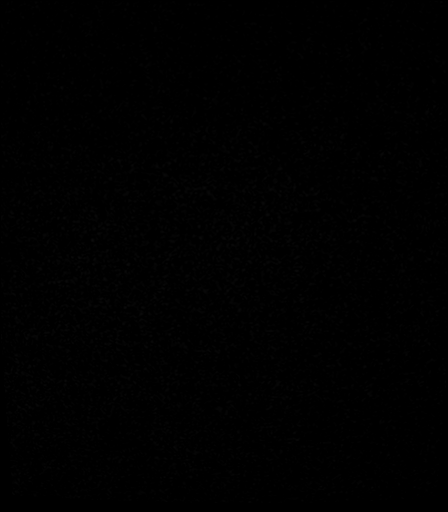

[Series 9: T2 · axial · 5.0mm · 0.72mm/px · z∈[-15,+138]mm · 2 of 23 slices shown (2 of 3)]
[im 1/23]
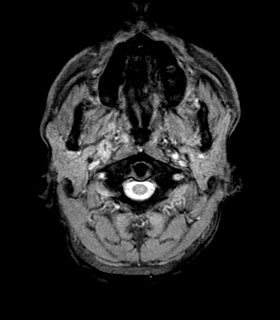
[im 23/23]
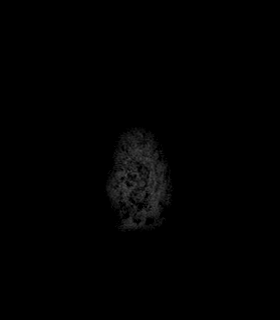

[Series 10: T1 · axial · 1.0mm · 1.00mm/px · z∈[-16,+142]mm · 15 of 160 slices shown (2 of 2)]
[im 1/160]
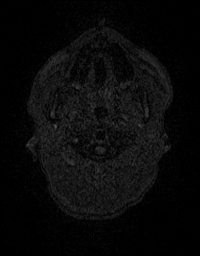
[im 12/160]
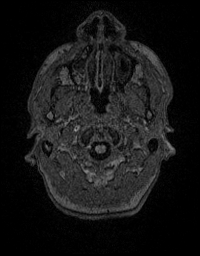
[im 23/160]
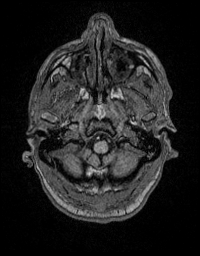
[im 35/160]
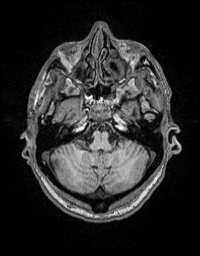
[im 46/160]
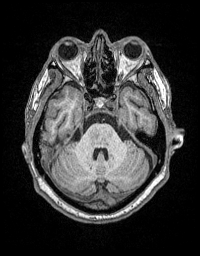
[im 57/160]
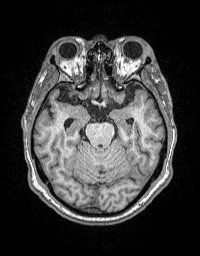
[im 69/160]
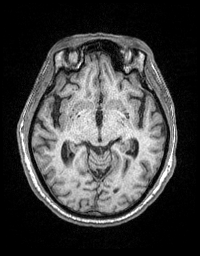
[im 80/160]
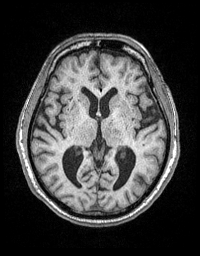
[im 91/160]
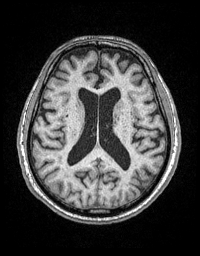
[im 103/160]
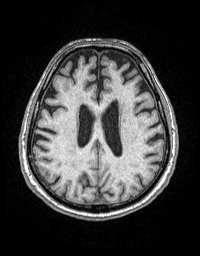
[im 114/160]
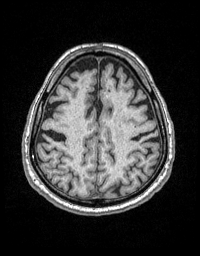
[im 125/160]
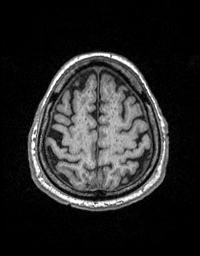
[im 137/160]
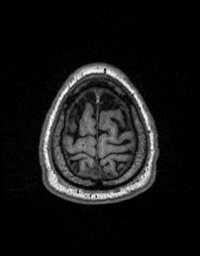
[im 148/160]
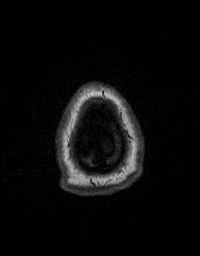
[im 160/160]
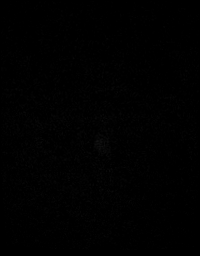

[Series 11: T2 · coronal · 5.0mm · 0.43mm/px · 3 of 29 slices shown (3 of 3)]
[im 1/29]
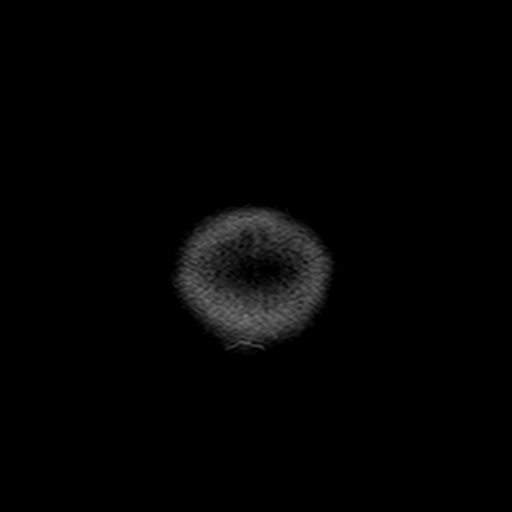
[im 15/29]
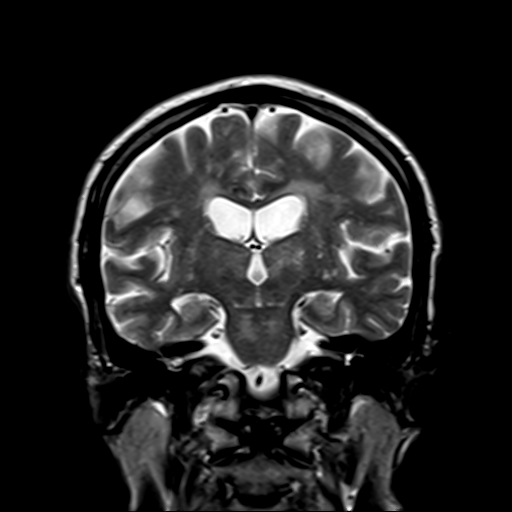
[im 29/29]
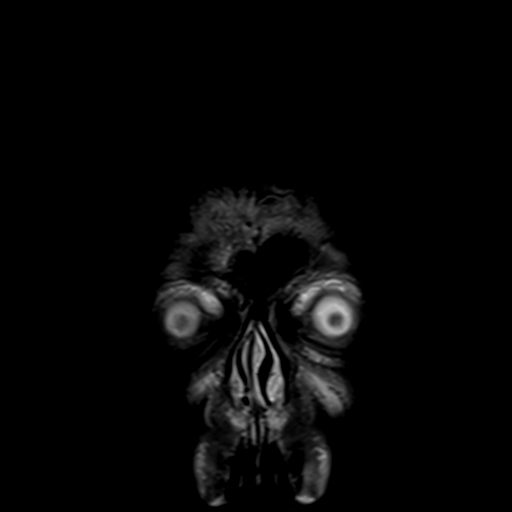

[Series 100: (id) ax · axial · 3.0mm · 1.20mm/px · z∈[-14,+146]mm · 5 of 55 slices shown]
[im 1/55]
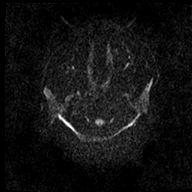
[im 14/55]
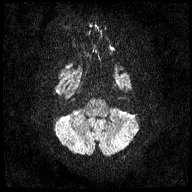
[im 28/55]
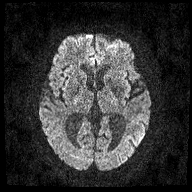
[im 41/55]
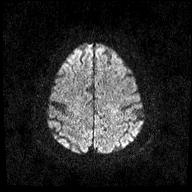
[im 55/55]
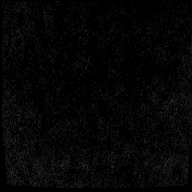

[Series 101: (id) cor · coronal · 3.0mm · 1.15mm/px · 4 of 45 slices shown]
[im 1/45]
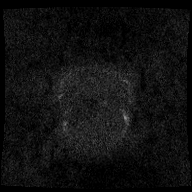
[im 15/45]
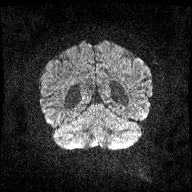
[im 30/45]
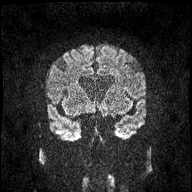
[im 45/45]
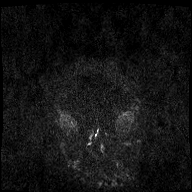

[48 of 48 positions shown; findings below may reference images not displayed]

FINDINGS: Brain: Diffusion imaging does not show any acute or subacute
infarction. Chronic small-vessel ischemic changes affect the pons.
No focal cerebellar insult. Cerebral hemispheres show chronic
small-vessel ischemic changes of the thalami and of the cerebral
hemispheric deep and subcortical white matter. No cortical or large
vessel territory infarction. No mass lesion, hemorrhage,
hydrocephalus or extra-axial collection.

Vascular: Major vessels at the base of the brain show flow.

Skull and upper cervical spine: Negative

Sinuses/Orbits: Inflammatory changes of the maxillary sinuses that
could possibly be symptomatic. Orbits negative.

Other: None
IMPRESSION: 1. No acute intracranial finding. Chronic small-vessel ischemic
changes throughout the brain as outlined above.
2. Inflammatory changes of the maxillary sinuses that could possibly
be symptomatic.

## 2020-10-11 ENCOUNTER — Encounter: Payer: Self-pay | Admitting: *Deleted

## 2020-10-11 DIAGNOSIS — Z87891 Personal history of nicotine dependence: Secondary | ICD-10-CM

## 2020-10-24 ENCOUNTER — Other Ambulatory Visit: Payer: Self-pay

## 2020-10-24 ENCOUNTER — Ambulatory Visit (INDEPENDENT_AMBULATORY_CARE_PROVIDER_SITE_OTHER): Payer: Medicare HMO

## 2020-10-24 DIAGNOSIS — I10 Essential (primary) hypertension: Secondary | ICD-10-CM

## 2020-10-24 DIAGNOSIS — J410 Simple chronic bronchitis: Secondary | ICD-10-CM

## 2020-10-24 NOTE — Patient Instructions (Addendum)
Visit Information  PATIENT GOALS:  Goals Addressed             This Visit's Progress    Track and Manage My Symptoms - chronic health conditions       Timeframe:  Long-Range Goal Priority:  High Start Date: 10/03/20                            Expected End Date:   04/05/21                    Follow Up Date 10/24/20    Patient Goals: - Continue to eat healthy: whole grains, fruits and vegetables, lean meats, healthy fats, monitor salt intake - Develop a rescue plan and follow rescue plan if symptoms flare-up - Eliminate symptom triggers at home - Continue smoking cessation strategies - Review calendar/organizer and education on smoking cessation, COPD action plan.  - Call your insurance provider re: blood pressure cuff, or you can get a blood pressure monitor at your local pharmacy.    Why is this important?   Tracking your symptoms and other information about your health helps your doctor plan your care.  Write down the symptoms, the time of day, what you were doing and what medicine you are taking.  You will soon learn how to manage your symptoms.         COPD Action Plan A COPD action plan is a description of what to do when you have a flare (exacerbation) of chronic obstructive pulmonary disease (COPD). Your action plan is a color-coded plan that lists the symptoms that indicate whether your condition is under control and what actions to take. If you have symptoms in the green zone, it means you are doing well that day. If you have symptoms in the yellow zone, it means you are having a bad day or an exacerbation. If you have symptoms in the red zone, you need urgent medical care. Follow the plan that you and your health care provider developed. Review your plan with your health care provider at each visit. Red zone Symptoms in this zone mean that you should get medical help right away. They include: Feeling very short of breath, even when you are resting. Not being able to  do any activities because of poor breathing. Not being able to sleep because of poor breathing. Fever or shaking chills. Feeling confused or very sleepy. Chest pain. Coughing up blood. If you have any of these symptoms, call emergency services (911 in the [place] or go to the nearest emergency room. Yellow zone Symptoms in this zone mean that your condition may be getting worse. They include: Feeling more short of breath than usual. Having less energy for daily activities than usual. Phlegm or mucus that is thicker than usual. Needing to use your rescue inhaler or nebulizer more often than usual. More ankle swelling than usual. Coughing more than usual. Feeling like you have a chest cold. Trouble sleeping due to COPD symptoms. Decreased appetite. COPD medicines not helping as much as usual. If you experience any "yellow" symptoms: Keep taking your daily medicines as directed. Use your quick-relief inhaler as told by your health care provider. If you were prescribed steroid medicine to take by mouth (oral medicine), start taking it as told by your health care provider. If you were prescribed an antibiotic medicine, start taking it as told by your health care provider. Do not stop taking the  antibiotic even if you start to feel better. Use oxygen as told by your health care provider. Get more rest. Do your pursed-lip breathing exercises. Do not smoke. Avoid any irritants in the air. If your signs and symptoms do not improve after taking these steps, call your health care provider right away. Green zone Symptoms in this zone mean that you are doing well. They include: Being able to do your usual activities and exercise. Having the usual amount of coughing, including the same amount of phlegm or mucus. Being able to sleep well. Having a good appetite. Where to find more information: You can find more information about COPD from: American Lung Association, My COPD Action Plan:  www.lung.org COPD Foundation: www.copdfoundation.[place]: https://wilson-eaton.com/ Follow these instructions at home: Continue taking your daily medicines as told by your health care provider. Make sure you receive all the immunizations that your health care provider recommends, especially the pneumococcal and influenza vaccines. Wash your hands often with soap and water. Have family members wash their hands too. Regular hand washing can help prevent infections. Follow your usual exercise and diet plan. Avoid irritants in the air, such as smoke. Do not use any products that contain nicotine or tobacco. These products include cigarettes, chewing tobacco, and vaping devices, such as e-cigarettes. If you need help quitting, ask your health care provider. Summary A COPD action plan tells you what to do when you have a flare (exacerbation) of chronic obstructive pulmonary disease (COPD). Follow each action plan for your symptoms. If you have any symptoms in the red zone, call emergency services (911 in the [place] or go to the nearest emergency room. This information is not intended to replace advice given to you by your health care provider. Make sure you discuss any questions you have with your health care provider. Document Revised: 12/13/2019 Document Reviewed: 12/13/2019 Elsevier Patient Education  2022 [employer].   The patient verbalized understanding of instructions, educational materials, and care plan provided today and agreed to receive a mailed copy of patient instructions, educational materials, and care plan.   Telephone follow up appointment with care management team member scheduled for:11/21/20 The patient has been provided with contact information for the care management team and has been advised to call with any health related questions or concerns.   Thea Silversmith, RN, MSN, BSN, CCM Care Management Coordinator [facility]  [facility] [phone]

## 2020-10-24 NOTE — Chronic Care Management (AMB) (Signed)
Chronic Care Management   CCM RN Visit Note  10/24/2020 Name: Shane Carrillo MRN: [id number] DOB: August 08, 1945  Subjective: Shane Carrillo is a 75 y.o. year old male who is a primary care patient of [name], DO. The care management team was consulted for assistance with disease management and care coordination needs.    Engaged with patient by telephone for follow up visit in response to provider referral for case management and/or care coordination services.   Consent to Services:  The patient was given information about Chronic Care Management services, agreed to services, and gave verbal consent prior to initiation of services.  Please see initial visit note for detailed documentation.   Patient agreed to services and verbal consent obtained.   Assessment: Review of patient past medical history, allergies, medications, health status, including review of consultants reports, laboratory and other test data, was performed as part of comprehensive evaluation and provision of chronic care management services.   SDOH (Social Determinants of Health) assessments and interventions performed:    CCM Care Plan  Allergies  Allergen Reactions   Atorvastatin Itching   Sildenafil Palpitations   Simvastatin Itching   Aspirin Other (See Comments)    GI upset - Pt states that he is able to take the coated form     Outpatient Encounter Medications as of 10/24/2020  Medication Sig   albuterol (PROAIR HFA) 108 (90 Base) MCG/ACT inhaler Inhale 2 puffs into the lungs every 6 (six) hours as needed for wheezing or shortness of breath.   aspirin EC 325 MG tablet Take 325 mg by mouth daily.   baclofen (LIORESAL) 10 MG tablet Take 1 tablet (10 mg total) by mouth 3 (three) times daily as needed for muscle spasms.   clopidogrel (PLAVIX) 75 MG tablet Take 75 mg by mouth daily.   docusate sodium (COLACE) 100 MG capsule Take 1 capsule (100 mg total) by mouth 2 (two) times daily. (Patient taking  differently: Take 200 mg by mouth every other day.)   famotidine (PEPCID) 40 MG tablet Take 40 mg by mouth daily as needed for heartburn or indigestion.   Fluticasone-Umeclidin-Vilant (TRELEGY ELLIPTA) 100-62.5-25 MCG/INH AEPB One inhalation once daily.   losartan (COZAAR) 50 MG tablet Take 1 tablet (50 mg total) by mouth daily.   mirtazapine (REMERON) 15 MG tablet Take 1 tablet (15 mg total) by mouth at bedtime.   Omega-3 Fatty Acids (FISH OIL) 1000 MG CAPS Take 1 g by mouth daily.   ondansetron (ZOFRAN-ODT) 4 MG disintegrating tablet Take 4 mg by mouth every 8 (eight) hours as needed for nausea/vomiting.   sildenafil (VIAGRA) 100 MG tablet TAKE ONE TABLET BY MOUTH DAILY AS NEEDED FOR ERECTILE DYSFUNCTION   tamsulosin (FLOMAX) 0.4 MG CAPS capsule Take 1 capsule (0.4 mg total) by mouth daily after supper.   amLODipine (NORVASC) 10 MG tablet Take 1 tablet (10 mg total) by mouth daily.   fluticasone (FLONASE) 50 MCG/ACT nasal spray Place 2 sprays into both nostrils daily.   lidocaine (XYLOCAINE) 2 % jelly Apply topically tid (plz sched with new provider for future fills) (Patient not taking: Reported on 10/03/2020)   potassium chloride (K-DUR) 10 MEQ tablet Take 1 tablet (10 mEq total) by mouth daily. (Patient taking differently: Take 10 mEq by mouth See admin instructions. Three times a week)   rosuvastatin (CRESTOR) 40 MG tablet Take 1 tablet (40 mg total) by mouth daily.   No facility-administered encounter medications on file as of 10/24/2020.    Patient  Active Problem List   Diagnosis Date Noted   Pneumonia due to COVID-19 virus 08/06/2020   COPD with exacerbation ([place]) 06/10/2020   Head injury 01/02/2020   Acute pain of right knee 01/02/2020   COPD (chronic obstructive pulmonary disease) ([place]) 12/16/2019   New onset of headaches 11/20/2019   Chest pain, rule out acute myocardial infarction 06/16/2019   Allergic rhinitis 06/03/2019   Rectal bleeding 02/07/2019   Mid back pain 02/01/2019    Peripheral arterial disease ([place]) 01/26/2019   Inflamed external hemorrhoid 11/23/2018   Essential hypertension 11/02/2018   Benign prostatic hyperplasia with urinary frequency 11/02/2018   Abnormal TSH 11/02/2018   CAD (coronary artery disease) 11/02/2018   Acute coronary syndrome ([place]) 10/09/2018   Cigarette smoker 02/26/2018   DOE (dyspnea on exertion) 02/25/2018   Abdominal aortic ectasia ([place]) 08/28/2016   Dysphagia 08/12/2016   Mild intermittent asthma without complication 0000000   Erectile dysfunction 07/30/2009   Hyperlipemia, mixed 01/11/2007    Conditions to be addressed/monitored:HTN, HLD, and COPD  Care Plan : Chronic Conditions  Updates made by Luretha Rued, RN since 10/24/2020 12:00 AM     Problem: Long term care plan for self-management of COPD in a patient with recent covid pneumonia and h/o HTN, HLD, PAD, GERD, STEMI   Priority: High     Long-Range Goal: Symptom Exacerbation Prevented or Minimized   Start Date: 10/03/2020  Expected End Date: 04/05/2021  This Visit's Progress: On track  Priority: High  Note:   Objective:  Last practice recorded BP readings:  BP Readings from Last 3 Encounters:  09/19/20 131/75  08/21/20 138/78  08/06/20 139/76  Most recent lipid panel:     Component Value Date/Time   CHOL 172 06/16/2019 1905   CHOL 186 01/05/2019 1046   TRIG 85 06/16/2019 1905   HDL 85 06/16/2019 1905   HDL 76 01/05/2019 1046   CHOLHDL 2.0 06/16/2019 1905   VLDL 17 06/16/2019 1905   LDLCALC 70 06/16/2019 1905   LDLCALC 97 01/05/2019 1046  Current Barriers:  Knowledge deficits related to long term care plan for self-management of COPD in a patient with a history of HTN, HLD, CAD, PAD,GERD. Covid pneumonia June 2022. Former smoker. Denies any signs/symptoms of exacerbation at this time. Reports was at the concert on Saturday and the heat was too much so he had to leave and recovered once he was in cooler air. He did not have inhaler with him.   Reports irritation/hoarseness of his throat that he has had in the past. He states it went away, but has come back. He states he is scheduling an appointment with PCP today.  Case Manager Clinical Goal(s): patient will report using inhalers as prescribed including rinsing mouth after use patient will verbalize understanding of COPD action plan and when to seek appropriate levels of medical care  Patient will obtain Blood Pressure monitor and begin to monitor blood pressure Patient will demonstrate improved adherence to prescribed treatment plan for hypertension as evidenced by taking medications as prescribed, monitoring and recording blood pressure as directed, adhering to low sodium/DASH diet. Interventions:  Collaboration with [name], DO regarding development and update of comprehensive plan of care as evidenced by provider attestation and co-signature Inter-disciplinary care team collaboration (see longitudinal plan of care) Reiterated importance of knowing and following the COPD action plan and reinforced importance of daily self assessment Medications reviewed with patient. Encouraged patient to take rescue inhaler with him when he is out.  Reviewed upcoming/scheduled  appointments. Encouraged continued smoking cessation.  Provided positive feedback regarding self-management of health.  Confirmed patient has received Calendar/Organizer which includes COPD action plan and HTN management tool. Encouraged to review. Confirmed patient has received resources from Care guide. Self-Care Activities:  Attends scheduled provider appointments Calls pharmacy for medication refills Calls provider office for new concerns or questions Patient Goals: - Continue to eat healthy: whole grains, fruits and vegetables, lean meats, healthy fats, monitor salt intake - Develop a rescue plan and follow rescue plan if symptoms flare-up - Eliminate symptom triggers at home - Continue smoking cessation  strategies - Review calendar/organizer and education on smoking cessation, COPD action plan.  - Call your insurance provider re: blood pressure cuff, or you can get a blood pressure monitor at your local pharmacy. Follow Up Plan: The patient has been provided with contact information for the care management team and has been advised to call with any health related questions or concerns.  The care management team will reach out to the patient again over the next 30-45 days.     Plan:The patient has been provided with contact information for the care management team and has been advised to call with any health related questions or concerns.  and The care management team will reach out to the patient again over the next 30-45 days.  Thea Silversmith, RN, MSN, BSN, CCM Care Management Coordinator [facility] [phone]

## 2020-10-30 ENCOUNTER — Ambulatory Visit (INDEPENDENT_AMBULATORY_CARE_PROVIDER_SITE_OTHER): Payer: Medicare HMO | Admitting: Family Medicine

## 2020-10-30 ENCOUNTER — Ambulatory Visit (INDEPENDENT_AMBULATORY_CARE_PROVIDER_SITE_OTHER): Payer: Medicare HMO

## 2020-10-30 ENCOUNTER — Encounter: Payer: Self-pay | Admitting: Family Medicine

## 2020-10-30 ENCOUNTER — Telehealth: Payer: Self-pay

## 2020-10-30 ENCOUNTER — Other Ambulatory Visit: Payer: Self-pay

## 2020-10-30 VITALS — BP 139/80 | HR 91 | Ht 65.0 in | Wt 137.0 lb

## 2020-10-30 DIAGNOSIS — J410 Simple chronic bronchitis: Secondary | ICD-10-CM

## 2020-10-30 DIAGNOSIS — J029 Acute pharyngitis, unspecified: Secondary | ICD-10-CM

## 2020-10-30 DIAGNOSIS — R198 Other specified symptoms and signs involving the digestive system and abdomen: Secondary | ICD-10-CM

## 2020-10-30 DIAGNOSIS — J441 Chronic obstructive pulmonary disease with (acute) exacerbation: Secondary | ICD-10-CM

## 2020-10-30 DIAGNOSIS — E782 Mixed hyperlipidemia: Secondary | ICD-10-CM

## 2020-10-30 DIAGNOSIS — R0989 Other specified symptoms and signs involving the circulatory and respiratory systems: Secondary | ICD-10-CM | POA: Insufficient documentation

## 2020-10-30 DIAGNOSIS — I10 Essential (primary) hypertension: Secondary | ICD-10-CM

## 2020-10-30 LAB — POCT RAPID STREP A (OFFICE): Rapid Strep A Screen: NEGATIVE

## 2020-10-30 MED ORDER — AMLODIPINE BESYLATE 10 MG PO TABS: 10.0000 mg | ORAL_TABLET | Freq: Every day | ORAL | 3 refills | Status: DC

## 2020-10-30 MED ORDER — PANTOPRAZOLE SODIUM 40 MG PO TBEC: 40.0000 mg | DELAYED_RELEASE_TABLET | Freq: Every day | ORAL | 3 refills | Status: DC

## 2020-10-30 MED ORDER — ROSUVASTATIN CALCIUM 40 MG PO TABS: 40.0000 mg | ORAL_TABLET | Freq: Every day | ORAL | 3 refills | Status: DC

## 2020-10-30 NOTE — Assessment & Plan Note (Signed)
Increased rhonchi noted on exam today.  Updating chest x-ray.  Continue current inhalers.

## 2020-10-30 NOTE — Progress Notes (Signed)
Shane Carrillo - 75 y.o. male MRN [id number]  Date of birth: [birth year]  Subjective Chief Complaint  Patient presents with   Sore Throat    HPI Shane Carrillo is a 75 year old male here today with complaint of throat pain.  This started a few weeks ago.  Describes sensation of "something growing in the back of his throat."  He has pain that is intermittent and feels like something is stuck in his throat.  This is worse when trying to swallow.  He has had some reflux symptoms and is currently taking famotidine.  He is using an inhaled corticosteroid but has not noticed any appearance of thrush.  There has been no fever, chills, nausea, vomiting.  He has had a little more dyspnea at times.  ROS:  A comprehensive ROS was completed and negative except as noted per HPI  Allergies  Allergen Reactions   Atorvastatin Itching   Sildenafil Palpitations   Simvastatin Itching   Aspirin Other (See Comments)    GI upset - Pt states that he is able to take the coated form     Past Medical History:  Diagnosis Date   Aortic ectasia, abdominal ([place]) 08/2016   On medicare screening u/s: Abdominal aortic atherosclerosis and mild ectasia. Maximal diameter 2.8 cm-->repeat u/s 5 yrs.   BPH with obstruction/lower urinary tract symptoms    Chronic cough    upper airway cough syndrome/irritable larynx syndrome->Dr. Melvyn Novas.   COPD (chronic obstructive pulmonary disease) (HCC)    Coronary artery disease    Stent.     DOE (dyspnea on exertion)    spirometry 02/2018 normal, but needs full PFT's per Dr. Melvyn Novas 03/2018.  No bronchodilators indicated as of 03/2018.   GERD (gastroesophageal reflux disease)    Dysphagia   Hypercholesterolemia    Intol of atorva and simva   Hypertension    PAD (peripheral artery disease) (HCC)    Abd aortic athero   Tobacco dependence    Unintentional weight loss 2019   +Dysphagia,dec appetite/abd pain->pt was set to get EGD/colonoscopy late 2019 but he never followed up with GI to get  this.    Past Surgical History:  Procedure Laterality Date   ABDOMINAL AORTOGRAM W/LOWER EXTREMITY N/A 02/24/2019   Procedure: ABDOMINAL AORTOGRAM W/LOWER EXTREMITY;  Surgeon: Lorretta Harp, MD;  Location: [facility];  Service: Cardiovascular;  Laterality: N/A;   CORONARY ANGIOPLASTY WITH STENT PLACEMENT     Dr. Ouida Sills in [place], [place].   FOOT SURGERY     PERIPHERAL VASCULAR INTERVENTION Left 02/24/2019   Procedure: PERIPHERAL VASCULAR INTERVENTION;  Surgeon: Lorretta Harp, MD;  Location: [facility];  Service: Cardiovascular;  Laterality: Left;   ROTATOR CUFF REPAIR      Social History   Socioeconomic History   Marital status: Divorced    Spouse name: Not on file   Number of children: Not on file   Years of education: 12th grade   Highest education level: Not on file  Occupational History    Comment: Retired  Tobacco Use   Smoking status: Former    Packs/day: 1.75    Years: 59.00    Pack years: 103.25    Types: Cigarettes    Quit date: 07/18/2020    Years since quitting: 0.2   Smokeless tobacco: Never  Vaping Use   Vaping Use: Never used  Substance and Sexual Activity   Alcohol use: Not Currently   Drug use: No   Sexual activity: Not on file  Other Topics Concern   Not on file  Social History Narrative   Lives alone. He has 12 grown children. He enjoys [place] the race track and the [place].   Social Determinants of Health   Financial Resource Strain: Medium Risk   Difficulty of Paying Living Expenses: Somewhat hard  Food Insecurity: Food Insecurity Present   Worried About [place] in the Last Year: Sometimes true   Ran Out of Food in the Last Year: Sometimes true  Transportation Needs: No Transportation Needs   Lack of Transportation (Medical): No   Lack of Transportation (Non-Medical): No  Physical Activity: Insufficiently Active   Days of Exercise per Week: 3 days   Minutes of Exercise per Session: 30 min  Stress: No Stress  Concern Present   Feeling of Stress : Only a little  Social Connections: Socially Isolated   Frequency of Communication with Friends and Family: Three times a week   Frequency of Social Gatherings with Friends and Family: Once a week   Attends Religious Services: Never   Marine scientist or Organizations: No   Attends Music therapist: Never   Marital Status: Divorced    Family History  Problem Relation Age of Onset   Cancer - Cervical Mother    Colon cancer Mother    Colon cancer Father    Prostate cancer Father    Diabetes Maternal Grandmother     Health Maintenance  Topic Date Due   COVID-19 Vaccine (1) Never done   Zoster Vaccines- Shingrix (1 of 2) 11/21/2020 (Originally 01/05/1997)   TETANUS/TDAP  06/19/2021 (Originally 01/07/2020)   Fecal DNA (Cologuard)  06/19/2021 (Originally 03/21/2019)   Hepatitis C Screening  08/21/2021 (Originally 01/05/1965)   HPV VACCINES  Aged Out   INFLUENZA VACCINE  Discontinued   PNA vac Low Risk Adult  Discontinued     ----------------------------------------------------------------------------------------------------------------------------------------------------------------------------------------------------------------- Physical Exam BP 139/80 (BP Location: Left Arm, Patient Position: Sitting, Cuff Size: Normal)   Pulse 91   Ht '5\' 5"'$  (1.651 m)   Wt 137 lb (62.1 kg)   SpO2 97%   BMI 22.80 kg/m   Physical Exam Constitutional:      Appearance: He is well-developed.  HENT:     Head: Normocephalic and atraumatic.     Mouth/Throat:     Mouth: Mucous membranes are moist.     Pharynx: Oropharynx is clear. No oropharyngeal exudate or posterior oropharyngeal erythema.  Eyes:     General: No scleral icterus. Pulmonary:     Effort: Pulmonary effort is normal.     Comments: Scattered rhonchi with mild wheezing on exam today. Musculoskeletal:     Cervical back: Neck supple.  Lymphadenopathy:     Cervical: No  cervical adenopathy.  Neurological:     Mental Status: He is alert.  Psychiatric:        Mood and Affect: Mood normal.        Behavior: Behavior normal.    ------------------------------------------------------------------------------------------------------------------------------------------------------------------------------------------------------------------- Assessment and Plan  Globus sensation Poss related to reflux.  We will switch him to pantoprazole for couple weeks and see if this improves his symptoms.  I will see any signs of thrush at this time but this is in the differential as well.  If not improving we discussed referral to ENT for further evaluation.  COPD (chronic obstructive pulmonary disease) (HCC) Increased rhonchi noted on exam today.  Updating chest x-ray.  Continue current inhalers.   Meds ordered this encounter  Medications   amLODipine (  NORVASC) 10 MG tablet    Sig: Take 1 tablet (10 mg total) by mouth daily.    Dispense:  180 tablet    Refill:  3   rosuvastatin (CRESTOR) 40 MG tablet    Sig: Take 1 tablet (40 mg total) by mouth daily.    Dispense:  90 tablet    Refill:  3    Prior Auth approved from 12/20/18 - 01/19/20   pantoprazole (PROTONIX) 40 MG tablet    Sig: Take 1 tablet (40 mg total) by mouth daily.    Dispense:  30 tablet    Refill:  3    No follow-ups on file.    This visit occurred during the SARS-CoV-2 public health emergency.  Safety protocols were in place, including screening questions prior to the visit, additional usage of staff PPE, and extensive cleaning of exam room while observing appropriate contact time as indicated for disinfecting solutions.

## 2020-10-30 NOTE — Telephone Encounter (Signed)
   Telephone encounter was:  Successful.  10/30/2020 Name: Shane Carrillo MRN: [id number] DOB: [birth year]  Shane Carrillo is a 75 y.o. year old male who is a primary care patient of [name], DO . The community resource team was consulted for assistance with  housing.  Care guide performed the following interventions: Spoke with patient confirmed email address [email or web address]. Letter saved in Epic.  Follow Up Plan:  Care guide will follow up with patient by phone over the next 7 days.  Wesly Whisenant, AAS Paralegal, [facility]  [address], [place] [address] ??[email or web address]  ?? [phone]   [email or web address]

## 2020-10-30 NOTE — Assessment & Plan Note (Signed)
Poss related to reflux.  We will switch him to pantoprazole for couple weeks and see if this improves his symptoms.  I will see any signs of thrush at this time but this is in the differential as well.  If not improving we discussed referral to ENT for further evaluation.

## 2020-10-30 NOTE — Patient Instructions (Addendum)
Start protonix '40mg'$  daily.  Have chest xray Follow up in 2 weeks.

## 2020-10-31 ENCOUNTER — Telehealth: Payer: Self-pay

## 2020-10-31 NOTE — Telephone Encounter (Signed)
   Telephone encounter was:  Successful.  10/31/2020 Name: Shane Carrillo MRN: [id number] DOB: Nov 24, 1945  Shane Carrillo is a 74 y.o. year old male who is a primary care patient of [name], DO . The community resource team was consulted for assistance with  housing.  Care guide performed the following interventions: Spoke with patient he has not checked his email but will check it today for the housing resources sent on 10/30/20.  Follow Up Plan:  Client will call me once he has checked his email.  Maks Cavallero, AAS Paralegal, [facility]  [address], [place] [address] ??[email or web address]  ?? [phone]   [email or web address]

## 2020-11-02 ENCOUNTER — Other Ambulatory Visit: Payer: Self-pay | Admitting: Family Medicine

## 2020-11-02 DIAGNOSIS — R198 Other specified symptoms and signs involving the digestive system and abdomen: Secondary | ICD-10-CM

## 2020-11-02 DIAGNOSIS — R0989 Other specified symptoms and signs involving the circulatory and respiratory systems: Secondary | ICD-10-CM

## 2020-11-02 DIAGNOSIS — R070 Pain in throat: Secondary | ICD-10-CM

## 2020-11-02 NOTE — Progress Notes (Signed)
ENT referral entered

## 2020-11-13 ENCOUNTER — Ambulatory Visit: Payer: Medicare HMO | Admitting: Family Medicine

## 2020-11-16 DIAGNOSIS — I10 Essential (primary) hypertension: Secondary | ICD-10-CM

## 2020-11-16 DIAGNOSIS — J410 Simple chronic bronchitis: Secondary | ICD-10-CM

## 2020-11-21 ENCOUNTER — Ambulatory Visit (INDEPENDENT_AMBULATORY_CARE_PROVIDER_SITE_OTHER): Payer: Medicare HMO

## 2020-11-21 ENCOUNTER — Other Ambulatory Visit: Payer: Self-pay

## 2020-11-21 DIAGNOSIS — J410 Simple chronic bronchitis: Secondary | ICD-10-CM

## 2020-11-21 DIAGNOSIS — I10 Essential (primary) hypertension: Secondary | ICD-10-CM

## 2020-11-21 NOTE — Chronic Care Management (AMB) (Signed)
Chronic Care Management   CCM RN Visit Note  11/21/2020 Name: Shane Carrillo MRN: [id number] DOB: 1945/11/09  Subjective: Shane Carrillo is a 75 y.o. year old male who is a primary care patient of Shane Nutting, DO. The care management team was consulted for assistance with disease management and care coordination needs.    Engaged with patient by telephone for follow up visit in response to provider referral for case management and/or care coordination services.   Consent to Services:  The patient was given information about Chronic Care Management services, agreed to services, and gave verbal consent prior to initiation of services.  Please see initial visit note for detailed documentation.   Patient agreed to services and verbal consent obtained.   Assessment: Review of patient past medical history, allergies, medications, health status, including review of consultants reports, laboratory and other test data, was performed as part of comprehensive evaluation and provision of chronic care management services.   SDOH (Social Determinants of Health) assessments and interventions performed:    CCM Care Plan  Allergies  Allergen Reactions   Atorvastatin Itching   Sildenafil Palpitations   Simvastatin Itching   Aspirin Other (See Comments)    GI upset - Pt states that he is able to take the coated form     Outpatient Encounter Medications as of 11/21/2020  Medication Sig Note   albuterol (PROAIR HFA) 108 (90 Base) MCG/ACT inhaler Inhale 2 puffs into the lungs every 6 (six) hours as needed for wheezing or shortness of breath.    amLODipine (NORVASC) 10 MG tablet Take 1 tablet (10 mg total) by mouth daily.    aspirin EC 325 MG tablet Take 325 mg by mouth daily.    baclofen (LIORESAL) 10 MG tablet Take 1 tablet (10 mg total) by mouth 3 (three) times daily as needed for muscle spasms.    clopidogrel (PLAVIX) 75 MG tablet Take 75 mg by mouth daily.    docusate sodium (COLACE) 100  MG capsule Take 1 capsule (100 mg total) by mouth 2 (two) times daily. (Patient taking differently: Take 200 mg by mouth every other day.)    fluticasone (FLONASE) 50 MCG/ACT nasal spray Place 2 sprays into both nostrils daily.    Fluticasone-Umeclidin-Vilant (TRELEGY ELLIPTA) 100-62.5-25 MCG/INH AEPB One inhalation once daily.    lidocaine (XYLOCAINE) 2 % jelly Apply topically tid (plz sched with new provider for future fills) 11/21/2020: Reports uses as needed.   losartan (COZAAR) 50 MG tablet Take 1 tablet (50 mg total) by mouth daily.    mirtazapine (REMERON) 15 MG tablet Take 1 tablet (15 mg total) by mouth at bedtime.    Omega-3 Fatty Acids (FISH OIL) 1000 MG CAPS Take 1 g by mouth daily.    ondansetron (ZOFRAN-ODT) 4 MG disintegrating tablet Take 4 mg by mouth every 8 (eight) hours as needed for nausea/vomiting.    pantoprazole (PROTONIX) 40 MG tablet Take 1 tablet (40 mg total) by mouth daily.    potassium chloride (K-DUR) 10 MEQ tablet Take 1 tablet (10 mEq total) by mouth daily. (Patient taking differently: Take 10 mEq by mouth See admin instructions. Three times a week)    rosuvastatin (CRESTOR) 40 MG tablet Take 1 tablet (40 mg total) by mouth daily.    sildenafil (VIAGRA) 100 MG tablet TAKE ONE TABLET BY MOUTH DAILY AS NEEDED FOR ERECTILE DYSFUNCTION    tamsulosin (FLOMAX) 0.4 MG CAPS capsule Take 1 capsule (0.4 mg total) by mouth daily after supper.  No facility-administered encounter medications on file as of 11/21/2020.    Patient Active Problem List   Diagnosis Date Noted   Globus sensation 10/30/2020   Pneumonia due to COVID-19 virus 08/06/2020   COPD with exacerbation ([place]) 06/10/2020   Head injury 01/02/2020   Acute pain of right knee 01/02/2020   COPD (chronic obstructive pulmonary disease) ([place]) 12/16/2019   New onset of headaches 11/20/2019   Chest pain, rule out acute myocardial infarction 06/16/2019   Allergic rhinitis 06/03/2019   Rectal bleeding 02/07/2019    Mid back pain 02/01/2019   Peripheral arterial disease ([place]) 01/26/2019   Inflamed external hemorrhoid 11/23/2018   Essential hypertension 11/02/2018   Benign prostatic hyperplasia with urinary frequency 11/02/2018   Abnormal TSH 11/02/2018   CAD (coronary artery disease) 11/02/2018   Acute coronary syndrome ([place]) 10/09/2018   Cigarette smoker 02/26/2018   DOE (dyspnea on exertion) 02/25/2018   Abdominal aortic ectasia ([place]) 08/28/2016   Dysphagia 08/12/2016   Mild intermittent asthma without complication 16/11/9602   Erectile dysfunction 07/30/2009   Hyperlipemia, mixed 01/11/2007    Conditions to be addressed/monitored:HTN and COPD  Care Plan : Chronic Conditions  Updates made by Shane Rued, RN since 11/21/2020 12:00 AM     Problem: Long term care plan for self-management of COPD in a patient with recent covid pneumonia and h/o HTN, HLD, PAD, GERD, STEMI   Priority: High     Long-Range Goal: Symptom Exacerbation Prevented or Minimized   Start Date: 10/03/2020  Expected End Date: 04/05/2021  Recent Progress: On track  Priority: High  Note:   Objective:  Last practice recorded BP readings:  BP Readings from Last 3 Encounters:  10/30/20 139/80  09/19/20 131/75  08/21/20 138/78  Most recent lipid panel:     Component Value Date/Time   CHOL 172 06/16/2019 1905   CHOL 186 01/05/2019 1046   TRIG 85 06/16/2019 1905   HDL 85 06/16/2019 1905   HDL 76 01/05/2019 1046   CHOLHDL 2.0 06/16/2019 1905   VLDL 17 06/16/2019 1905   LDLCALC 70 06/16/2019 1905   LDLCALC 97 01/05/2019 1046  Current Barriers:  Knowledge deficits related to long term care plan for self-management of COPD in a patient with a history of HTN, HLD, CAD, PAD,GERD. Covid pneumonia June 2022. Former smoker. Reports discomfort in back of his throat-evaluated by PCP on 11/09/20 and is awaiting appointment at ENT scheduled for 12/05/20. Patient frustrated with wait time for the 12/05/20. Mr. Shane Carrillo states he  is using his rescue inhaler about twice a day. He states he has been using it more over the past month or so. Case Manager Clinical Goal(s): patient will report using inhalers as prescribed including rinsing mouth after use patient will verbalize understanding of COPD action plan and when to seek appropriate levels of medical care  Medications reviewed. Patient encouraged to continue to take as recommended. Patient will obtain Blood Pressure monitor and begin to monitor blood pressure Patient will demonstrate improved adherence to prescribed treatment plan for hypertension as evidenced by taking medications as prescribed, monitoring and recording blood pressure as directed, adhering to low sodium/DASH diet. Interventions:  Collaboration with Shane Nutting, DO regarding development and update of comprehensive plan of care as evidenced by provider attestation and co-signature Inter-disciplinary care team collaboration (see longitudinal plan of care) Encouraged patient to contact referred ENT provider to see if he can be placed on cancellation list for a sooner appointment.  Medications reviewed with patient. Reviewed upcoming/scheduled appointments.  Patient has not seen pulmonologist in over a year. Encouraged to call pulmonologist to schedule appointment/increase use of rescue inhaler. Encouraged continued smoking cessation.  Reviewed COPD action plan Provided positive feedback regarding self-management of health.  Discussed plans for ongoing care management follow up and provided patient with direct contact information for care management team. Self-Care Activities:  Attends scheduled provider appointments Calls pharmacy for medication refills Calls provider office for new concerns or questions Patient Goals: - Contact your ENT provider to see if they have a cancellation list, and if you can be put on the list to get a sooner appointment. - Continue to eat healthy: whole grains, fruits and  vegetables, lean meats, healthy fats, monitor salt intake - Follow COPD action plan and call provider for flare up or if your condition worsens.  - Continue smoking cessation strategies - Call your insurance provider re: blood pressure monitor, or you can get a blood pressure monitor at your local pharmacy if you have not obtained one already. Follow Up Plan: The patient has been provided with contact information for the care management team and has been advised to call with any health related questions or concerns.  The care management team will reach out to the patient again next month.     Plan:Telephone follow up appointment with care management team member scheduled for: next month and The patient has been provided with contact information for the care management team and has been advised to call with any health related questions or concerns.   Thea Silversmith, RN, MSN, BSN, CCM Care Management Coordinator [facility] [phone]

## 2020-11-21 NOTE — Patient Instructions (Signed)
Visit Information  PATIENT GOALS:  Goals Addressed             This Visit's Progress    Track and Manage My Symptoms - chronic health conditions   On track    Timeframe:  Long-Range Goal Priority:  High Start Date: 10/03/20                            Expected End Date:   04/05/21                    Follow Up Date 12/19/20    Patient Goals: - Contact your ENT provider to see if they have a cancellation list, and if you can be put on the list to get a sooner appointment. - Continue to eat healthy: whole grains, fruits and vegetables, lean meats, healthy fats, monitor salt intake - Follow COPD action plan and call provider for flare up or if your condition worsens.  - Continue smoking cessation strategies - Call your insurance provider re: blood pressure monitor, or you can get a blood pressure monitor at your local pharmacy if you have not obtained one already.       The patient verbalized understanding of instructions, educational materials, and care plan provided today and declined offer to receive copy of patient instructions, educational materials, and care plan.   Telephone follow up appointment with care management team member scheduled for: 12/19/20 The patient has been provided with contact information for the care management team and has been advised to call with any health related questions or concerns.   Thea Silversmith, RN, MSN, BSN, CCM Care Management Coordinator [facility] [phone]

## 2020-11-28 ENCOUNTER — Other Ambulatory Visit: Payer: Self-pay

## 2020-11-28 ENCOUNTER — Ambulatory Visit (HOSPITAL_COMMUNITY)
Admission: RE | Admit: 2020-11-28 | Discharge: 2020-11-28 | Disposition: A | Discharge status: Home / Self Care | Payer: Medicare HMO | Source: Ambulatory Visit | Attending: Cardiology | Admitting: Cardiology

## 2020-11-28 DIAGNOSIS — I739 Peripheral vascular disease, unspecified: Secondary | ICD-10-CM | POA: Diagnosis not present

## 2020-12-04 ENCOUNTER — Other Ambulatory Visit: Payer: Self-pay

## 2020-12-04 DIAGNOSIS — I739 Peripheral vascular disease, unspecified: Secondary | ICD-10-CM

## 2020-12-12 ENCOUNTER — Ambulatory Visit: Payer: Medicare HMO

## 2020-12-12 DIAGNOSIS — I10 Essential (primary) hypertension: Secondary | ICD-10-CM

## 2020-12-12 DIAGNOSIS — J410 Simple chronic bronchitis: Secondary | ICD-10-CM

## 2020-12-12 NOTE — Chronic Care Management (AMB) (Signed)
Chronic Care Management   CCM RN Visit Note  12/12/2020 Name: Shane Carrillo MRN: [id number] DOB: 1945/10/12  Subjective: Shane Carrillo is a 75 y.o. year old male who is a primary care patient of [name], DO. The care management team was consulted for assistance with disease management and care coordination needs.    Engaged with patient by telephone for follow up visit in response to provider referral for case management and/or care coordination services.   Consent to Services:  The patient was given information about Chronic Care Management services, agreed to services, and gave verbal consent prior to initiation of services.  Please see initial visit note for detailed documentation.   Patient agreed to services and verbal consent obtained.   Assessment: Review of patient past medical history, allergies, medications, health status, including review of consultants reports, laboratory and other test data, was performed as part of comprehensive evaluation and provision of chronic care management services.   SDOH (Social Determinants of Health) assessments and interventions performed:    CCM Care Plan  Allergies  Allergen Reactions   Atorvastatin Itching   Sildenafil Palpitations   Simvastatin Itching   Aspirin Other (See Comments)    GI upset - Pt states that he is able to take the coated form     Outpatient Encounter Medications as of 12/12/2020  Medication Sig Note   albuterol (PROAIR HFA) 108 (90 Base) MCG/ACT inhaler Inhale 2 puffs into the lungs every 6 (six) hours as needed for wheezing or shortness of breath.    amLODipine (NORVASC) 10 MG tablet Take 1 tablet (10 mg total) by mouth daily.    aspirin EC 325 MG tablet Take 325 mg by mouth daily.    baclofen (LIORESAL) 10 MG tablet Take 1 tablet (10 mg total) by mouth 3 (three) times daily as needed for muscle spasms.    clopidogrel (PLAVIX) 75 MG tablet Take 75 mg by mouth daily.    docusate sodium (COLACE) 100  MG capsule Take 1 capsule (100 mg total) by mouth 2 (two) times daily. (Patient taking differently: Take 200 mg by mouth every other day.)    fluticasone (FLONASE) 50 MCG/ACT nasal spray Place 2 sprays into both nostrils daily.    Fluticasone-Umeclidin-Vilant (TRELEGY ELLIPTA) 100-62.5-25 MCG/INH AEPB One inhalation once daily.    losartan (COZAAR) 50 MG tablet Take 1 tablet (50 mg total) by mouth daily.    mirtazapine (REMERON) 15 MG tablet Take 1 tablet (15 mg total) by mouth at bedtime.    Omega-3 Fatty Acids (FISH OIL) 1000 MG CAPS Take 1 g by mouth daily.    ondansetron (ZOFRAN-ODT) 4 MG disintegrating tablet Take 4 mg by mouth every 8 (eight) hours as needed for nausea/vomiting.    pantoprazole (PROTONIX) 40 MG tablet Take 1 tablet (40 mg total) by mouth daily.    potassium chloride (K-DUR) 10 MEQ tablet Take 1 tablet (10 mEq total) by mouth daily. (Patient taking differently: Take 10 mEq by mouth See admin instructions. Three times a week)    rosuvastatin (CRESTOR) 40 MG tablet Take 1 tablet (40 mg total) by mouth daily.    sildenafil (VIAGRA) 100 MG tablet TAKE ONE TABLET BY MOUTH DAILY AS NEEDED FOR ERECTILE DYSFUNCTION    tamsulosin (FLOMAX) 0.4 MG CAPS capsule Take 1 capsule (0.4 mg total) by mouth daily after supper.    lidocaine (XYLOCAINE) 2 % jelly Apply topically tid (plz sched with new provider for future fills) 11/21/2020: Reports uses as needed.  No facility-administered encounter medications on file as of 12/12/2020.    Patient Active Problem List   Diagnosis Date Noted   Globus sensation 10/30/2020   Pneumonia due to COVID-19 virus 08/06/2020   COPD with exacerbation ([place]) 06/10/2020   Head injury 01/02/2020   Acute pain of right knee 01/02/2020   COPD (chronic obstructive pulmonary disease) ([place]) 12/16/2019   New onset of headaches 11/20/2019   Chest pain, rule out acute myocardial infarction 06/16/2019   Allergic rhinitis 06/03/2019   Rectal bleeding 02/07/2019    Mid back pain 02/01/2019   Peripheral arterial disease ([place]) 01/26/2019   Inflamed external hemorrhoid 11/23/2018   Essential hypertension 11/02/2018   Benign prostatic hyperplasia with urinary frequency 11/02/2018   Abnormal TSH 11/02/2018   CAD (coronary artery disease) 11/02/2018   Acute coronary syndrome ([place]) 10/09/2018   Cigarette smoker 02/26/2018   DOE (dyspnea on exertion) 02/25/2018   Abdominal aortic ectasia ([place]) 08/28/2016   Dysphagia 08/12/2016   Mild intermittent asthma without complication 19/41/7408   Erectile dysfunction 07/30/2009   Hyperlipemia, mixed 01/11/2007    Conditions to be addressed/monitored:HTN, HLD, and COPD  Care Plan : Chronic Conditions  Updates made by Luretha Rued, RN since 12/12/2020 12:00 AM     Problem: Long term care plan for self-management of COPD in a patient with recent covid pneumonia and h/o HTN, HLD, PAD, GERD, STEMI   Priority: High     Long-Range Goal: Symptom Exacerbation Prevented or Minimized   Start Date: 10/03/2020  Expected End Date: 04/05/2021  This Visit's Progress: On track  Recent Progress: On track  Priority: High  Note:   Objective:  Last practice recorded BP readings:  BP Readings from Last 3 Encounters:  10/30/20 139/80  09/19/20 131/75  08/21/20 138/78  Most recent lipid panel:     Component Value Date/Time   CHOL 172 06/16/2019 1905   CHOL 186 01/05/2019 1046   TRIG 85 06/16/2019 1905   HDL 85 06/16/2019 1905   HDL 76 01/05/2019 1046   CHOLHDL 2.0 06/16/2019 1905   VLDL 17 06/16/2019 1905   LDLCALC 70 06/16/2019 1905   LDLCALC 97 01/05/2019 1046  Current Barriers:  Knowledge deficits related to long term care plan for self-management of COPD in a patient with a history of HTN, HLD, CAD, PAD,GERD. Covid pneumonia June 2022. Former smoker. Primary concern for Mr Neumeister today:  Mrs. Orrego states he has just been diagnosed with cancer. Mr. Zietlow reports he is being evaluated for cancer in his  "throat and vocal chords". He reports a follow up visit is scheduled for tomorrow with Dr. Hendricks Limes (otolaryngology) to discuss plan of care. He is being proactive regarding possible home care needs. He reports he lives alone, adding that he has family in [place], but does not want to move back to [place] and the closest family lives in [place], [place]. He states he is going to need someone to help him with personal care needs such as cooking, cleaning the house pending treatment and his response to the treatment.  Case Manager Clinical Goal(s): patient will report using inhalers as prescribed including rinsing mouth after use patient will verbalize understanding of COPD action plan and when to seek appropriate levels of medical care  Medications reviewed. Patient encouraged to continue to take as recommended. Patient will obtain Blood Pressure monitor and begin to monitor blood pressure Patient will demonstrate improved adherence to prescribed treatment plan for hypertension as evidenced by taking medications as prescribed, monitoring  and recording blood pressure as directed, adhering to low sodium/DASH diet. Interventions:  Collaboration with [name], DO regarding development and update of comprehensive plan of care as evidenced by provider attestation and co-signature Inter-disciplinary care team collaboration (see longitudinal plan of care) RNCM reviewed Medications with patient. Discussed inhaler use. Patient states he has been using his inhalers as prescribed. Reviewed upcoming/scheduled appointments.  Proactive discussion regarding possible personal care service needs. Patient states he has Medicaid and will call to determine if he is eligible for [facility] service through Medicaid.  Discussed LCSW available if felt overwhelmed or would like to speak with someone regarding coping with new diagnosis Encouraged continued smoking cessation.  Discussed plans for ongoing care management follow up and  provided patient with direct contact information for care management team. Self-Care Activities:  Attends scheduled provider appointments Calls pharmacy for medication refills Calls provider office for new concerns or questions Patient Goals: - Contact Medicaid worker to see if you have Personal Care Services benefits. - Continue to eat healthy: whole grains, fruits and vegetables, lean meats, healthy fats, monitor salt intake - Follow COPD action plan and call provider for flare up or if your condition worsens.  - Continue smoking cessation strategies - Please call your Care management team for ongoing care coordination and care management needs. Follow Up Plan: The patient has been provided with contact information for the care management team and has been advised to call with any health related questions or concerns.  The care management team will reach out to the patient again next month.       Plan:Telephone follow up appointment with care management team member scheduled for:  next month The patient has been provided with contact information for the care management team and has been advised to call with any health related questions or concerns.    Thea Silversmith, RN, MSN, BSN, CCM Care Management Coordinator [facility] [phone]

## 2020-12-12 NOTE — Patient Instructions (Addendum)
Visit Information: Thank you for taking the time to speak with me today. Below you will see goals we have discussed today.  PATIENT GOALS:  Goals Addressed             This Visit's Progress    Track and Manage My Symptoms - chronic health conditions   On track    Timeframe:  Long-Range Goal Priority:  High Start Date: 10/03/20                            Expected End Date:   04/05/21                    Follow Up Date 12/26/20    Patient Goals: - Contact Medicaid worker to see if you have Personal Care Services benefits. - Continue to eat healthy: whole grains, fruits and vegetables, lean meats, healthy fats, monitor salt intake - Follow COPD action plan and call provider for flare up or if your condition worsens.  - Continue smoking cessation strategies - Please call your Care management team for ongoing care coordination and care management needs.       Chronic Obstructive Pulmonary Disease Chronic obstructive pulmonary disease (COPD) is a long-term (chronic) lung problem. When you have COPD, it is hard for air to get in and out of your lungs. Usually the condition gets worse over time, and your lungs will never return to normal. There are things you can do to keep yourself as healthy as possible. What are the causes? Smoking. This is the most common cause. Certain genes passed from parent to child (inherited). What increases the risk? Being exposed to secondhand smoke from cigarettes, pipes, or cigars. Being exposed to chemicals and other irritants, such as fumes and dust in the work environment. Having chronic lung conditions or infections. What are the signs or symptoms? Shortness of breath, especially during physical activity. A long-term cough with a large amount of thick mucus. Sometimes, the cough may not have any mucus (dry cough). Wheezing. Breathing quickly. Skin that looks gray or blue, especially in the fingers, toes, or lips. Feeling tired (fatigue). Weight  loss. Chest tightness. Having infections often. Episodes when breathing symptoms become much worse (exacerbations). At the later stages of this disease, you may have swelling in the ankles, feet, or legs. How is this treated? Taking medicines. Quitting smoking, if you smoke. Rehabilitation. This includes steps to make your body work better. It may involve a team of specialists. Doing exercises. Making changes to your diet. Using oxygen. Lung surgery. Lung transplant. Comfort measures (palliative care). Follow these instructions at home: Medicines Take over-the-counter and prescription medicines only as told by your doctor. Talk to your doctor before taking any cough or allergy medicines. You may need to avoid medicines that cause your lungs to be dry. Lifestyle If you smoke, stop smoking. Smoking makes the problem worse. Do not smoke or use any products that contain nicotine or tobacco. If you need help quitting, ask your doctor. Avoid being around things that make your breathing worse. This may include smoke, chemicals, and fumes. Stay active, but remember to rest as well. Learn and use tips on how to manage stress and control your breathing. Make sure you get enough sleep. Most adults need at least 7 hours of sleep every night. Eat healthy foods. Eat smaller meals more often. Rest before meals. Controlled breathing Learn and use tips on how to control your breathing  as told by your doctor. Try: Breathing in (inhaling) through your nose for 1 second. Then, pucker your lips and breath out (exhale) through your lips for 2 seconds. Putting one hand on your belly (abdomen). Breathe in slowly through your nose for 1 second. Your hand on your belly should move out. Pucker your lips and breathe out slowly through your lips. Your hand on your belly should move in as you breathe out.  Controlled coughing Learn and use controlled coughing to clear mucus from your lungs. Follow these  steps: Lean your head a little forward. Breathe in deeply. Try to hold your breath for 3 seconds. Keep your mouth slightly open while coughing 2 times. Spit any mucus out into a tissue. Rest and do the steps again 1 or 2 times as needed. General instructions Make sure you get all the shots (vaccines) that your doctor recommends. Ask your doctor about a flu shot and a pneumonia shot. Use oxygen therapy and pulmonary rehabilitation if told by your doctor. If you need home oxygen therapy, ask your doctor if you should buy a tool to measure your oxygen level (oximeter). Make a COPD action plan with your doctor. This helps you to know what to do if you feel worse than usual. Manage any other conditions you have as told by your doctor. Avoid going outside when it is very hot, cold, or humid. Avoid people who have a sickness you can catch (contagious). Keep all follow-up visits. Contact a doctor if: You cough up more mucus than usual. There is a change in the color or thickness of the mucus. It is harder to breathe than usual. Your breathing is faster than usual. You have trouble sleeping. You need to use your medicines more often than usual. You have trouble doing your normal activities such as getting dressed or walking around the house. Get help right away if: You have shortness of breath while resting. You have shortness of breath that stops you from: Being able to talk. Doing normal activities. Your chest hurts for longer than 5 minutes. Your skin color is more blue than usual. Your pulse oximeter shows that you have low oxygen for longer than 5 minutes. You have a fever. You feel too tired to breathe normally. These symptoms may represent a serious problem that is an emergency. Do not wait to see if the symptoms will go away. Get medical help right away. Call your local emergency services (911 in the [place] Do not drive yourself to the hospital. Summary Chronic obstructive pulmonary  disease (COPD) is a long-term lung problem. The way your lungs work will never return to normal. Usually the condition gets worse over time. There are things you can do to keep yourself as healthy as possible. Take over-the-counter and prescription medicines only as told by your doctor. If you smoke, stop. Smoking makes the problem worse. This information is not intended to replace advice given to you by your health care provider. Make sure you discuss any questions you have with your health care provider. Document Revised: 12/13/2019 Document Reviewed: 12/13/2019 Elsevier Patient Education  2022 [employer].  The patient verbalized understanding of instructions, educational materials, and care plan provided today and agreed to receive a mailed copy of patient instructions, educational materials, and care plan.   Telephone follow up appointment with care management team member scheduled for: 12/26/20 The patient has been provided with contact information for the care management team and has been advised to call with any health  related questions or concerns.   Thea Silversmith, RN, MSN, BSN, CCM Care Management Coordinator [facility] [phone]

## 2020-12-17 DIAGNOSIS — J410 Simple chronic bronchitis: Secondary | ICD-10-CM | POA: Diagnosis not present

## 2020-12-17 DIAGNOSIS — I10 Essential (primary) hypertension: Secondary | ICD-10-CM

## 2020-12-19 ENCOUNTER — Telehealth: Payer: Medicare HMO

## 2020-12-19 ENCOUNTER — Telehealth: Payer: Self-pay | Admitting: *Deleted

## 2020-12-19 ENCOUNTER — Other Ambulatory Visit: Payer: Self-pay

## 2020-12-19 ENCOUNTER — Ambulatory Visit (INDEPENDENT_AMBULATORY_CARE_PROVIDER_SITE_OTHER): Payer: Medicare HMO | Admitting: Family Medicine

## 2020-12-19 DIAGNOSIS — J441 Chronic obstructive pulmonary disease with (acute) exacerbation: Secondary | ICD-10-CM

## 2020-12-19 DIAGNOSIS — J387 Other diseases of larynx: Secondary | ICD-10-CM | POA: Diagnosis not present

## 2020-12-19 MED ORDER — PREDNISONE 20 MG PO TABS: 20.0000 mg | ORAL_TABLET | Freq: Two times a day (BID) | ORAL | 0 refills | Status: AC

## 2020-12-19 MED ORDER — FLUTICASONE PROPIONATE 50 MCG/ACT NA SUSP: 2.0000 | Freq: Every day | NASAL | 6 refills | Status: DC

## 2020-12-19 MED ORDER — AMOXICILLIN-POT CLAVULANATE 875-125 MG PO TABS: 1.0000 | ORAL_TABLET | Freq: Two times a day (BID) | ORAL | 0 refills | Status: DC

## 2020-12-19 MED ORDER — CETIRIZINE HCL 10 MG PO TABS: 10.0000 mg | ORAL_TABLET | Freq: Every day | ORAL | 3 refills | Status: DC

## 2020-12-19 NOTE — Chronic Care Management (AMB) (Signed)
  Care Management   Note  12/19/2020 Name: [name] MRN: [id number] DOB: [birth year]  Shane Carrillo is a 75 y.o. year old male who is a primary care patient of [name], DO and is actively engaged with the care management team. I reached out to [name] by phone today to assist with re-scheduling a follow up visit with the RN Case Manager  Follow up plan: Unsuccessful telephone outreach attempt made. A HIPAA compliant phone message was left for the patient providing contact information and requesting a return call.  Keep same day appointment moved to 330pm, left message to confirm with pt    Julian Hy, [place] Management  Direct Dial: [phone]

## 2020-12-20 ENCOUNTER — Encounter: Payer: Self-pay | Admitting: Family Medicine

## 2020-12-20 DIAGNOSIS — J387 Other diseases of larynx: Secondary | ICD-10-CM

## 2020-12-20 HISTORY — DX: Other diseases of larynx: J38.7

## 2020-12-20 NOTE — Assessment & Plan Note (Signed)
Likely malignancy.  He has appointment with ENT tomorrow.

## 2020-12-20 NOTE — Progress Notes (Signed)
Shane Carrillo - 75 y.o. male MRN [id number]  Date of birth: 10/28/1945  Subjective Chief Complaint  Patient presents with   Nasal Congestion    HPI Shane Carrillo is a 75 year old male here today with complaint of chest congestion, wheezing and mild dyspnea.  He reports that he has been out of of his inhalers for the past several days.  He has not had fever, chills, body aches or increased fatigue.  He denies nausea, vomiting or diarrhea.  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  He continues to have hoarseness.  He denies pain.  He has follow-up with ENT tomorrow.  ROS:  A comprehensive ROS was completed and negative except as noted per HPI  Allergies  Allergen Reactions   Atorvastatin Itching   Sildenafil Palpitations   Simvastatin Itching   Aspirin Other (See Comments)    GI upset - Pt states that he is able to take the coated form     Past Medical History:  Diagnosis Date   Aortic ectasia, abdominal ([place]) 08/2016   On medicare screening u/s: Abdominal aortic atherosclerosis and mild ectasia. Maximal diameter 2.8 cm-->repeat u/s 5 yrs.   BPH with obstruction/lower urinary tract symptoms    Chronic cough    upper airway cough syndrome/irritable larynx syndrome->Dr. Melvyn Novas.   COPD (chronic obstructive pulmonary disease) (HCC)    Coronary artery disease    Stent.     DOE (dyspnea on exertion)    spirometry 02/2018 normal, but needs full PFT's per Dr. Melvyn Novas 03/2018.  No bronchodilators indicated as of 03/2018.   GERD (gastroesophageal reflux disease)    Dysphagia   Hypercholesterolemia    Intol of atorva and simva   Hypertension    PAD (peripheral artery disease) (HCC)    Abd aortic athero   Tobacco dependence    Unintentional weight loss 2019   +Dysphagia,dec appetite/abd pain->pt was set to get EGD/colonoscopy late 2019 but he never followed up with GI to get this.    Past Surgical History:  Procedure Laterality Date    ABDOMINAL AORTOGRAM W/LOWER EXTREMITY N/A 02/24/2019   Procedure: ABDOMINAL AORTOGRAM W/LOWER EXTREMITY;  Surgeon: Lorretta Harp, MD;  Location: [facility];  Service: Cardiovascular;  Laterality: N/A;   CORONARY ANGIOPLASTY WITH STENT PLACEMENT     Dr. Ouida Sills in [place], [place].   FOOT SURGERY     PERIPHERAL VASCULAR INTERVENTION Left 02/24/2019   Procedure: PERIPHERAL VASCULAR INTERVENTION;  Surgeon: Lorretta Harp, MD;  Location: [facility];  Service: Cardiovascular;  Laterality: Left;   ROTATOR CUFF REPAIR      Social History   Socioeconomic History   Marital status: Divorced    Spouse name: Not on file   Number of children: Not on file   Years of education: 12th grade   Highest education level: Not on file  Occupational History    Comment: Retired  Tobacco Use   Smoking status: Former    Packs/day: 1.75    Years: 59.00    Pack years: 103.25    Types: Cigarettes    Quit date: 07/18/2020    Years since quitting: 0.4   Smokeless tobacco: Never  Vaping Use   Vaping Use: Never used  Substance and Sexual Activity   Alcohol use: Not Currently   Drug use: No   Sexual activity: Not on file  Other Topics Concern   Not on file  Social History Narrative  Lives alone. He has 12 grown children. He enjoys going to the race track and the casino.   Social Determinants of Health   Financial Resource Strain: Medium Risk   Difficulty of Paying Living Expenses: Somewhat hard  Food Insecurity: Food Insecurity Present   Worried About [place] in the Last Year: Sometimes true   Ran Out of Food in the Last Year: Sometimes true  Transportation Needs: No Transportation Needs   Lack of Transportation (Medical): No   Lack of Transportation (Non-Medical): No  Physical Activity: Insufficiently Active   Days of Exercise per Week: 3 days   Minutes of Exercise per Session: 30 min  Stress: No Stress Concern Present   Feeling of Stress : Only a little  Social  Connections: Socially Isolated   Frequency of Communication with Friends and Family: Three times a week   Frequency of Social Gatherings with Friends and Family: Once a week   Attends Religious Services: Never   Marine scientist or Organizations: No   Attends Music therapist: Never   Marital Status: Divorced    Family History  Problem Relation Age of Onset   Cancer - Cervical Mother    Colon cancer Mother    Colon cancer Father    Prostate cancer Father    Diabetes Maternal Grandmother     Health Maintenance  Topic Date Due   COVID-19 Vaccine (1) Never done   Zoster Vaccines- Shingrix (1 of 2) Never done   Pneumonia Vaccine 63+ Years old (2 - PPSV23 if available, else PCV20) 02/27/2017   TETANUS/TDAP  06/19/2021 (Originally 01/07/2020)   Fecal DNA (Cologuard)  06/19/2021 (Originally 03/21/2019)   Hepatitis C Screening  08/21/2021 (Originally 01/05/1965)   HPV VACCINES  Aged Out   INFLUENZA VACCINE  Discontinued     ----------------------------------------------------------------------------------------------------------------------------------------------------------------------------------------------------------------- Physical Exam BP (!) 161/77 (BP Location: Left Arm, Patient Position: Sitting, Cuff Size: Small)   Pulse 77   Temp 98 F (36.7 C)   Ht 5\' 5"  (1.651 m)   Wt 140 lb 11.2 oz (63.8 kg)   SpO2 100%   BMI 23.41 kg/m   Physical Exam Constitutional:      Appearance: Normal appearance.  Eyes:     General: No scleral icterus. Cardiovascular:     Rate and Rhythm: Normal rate and regular rhythm.  Pulmonary:     Effort: Pulmonary effort is normal.     Breath sounds: Wheezing present.  Musculoskeletal:     Cervical back: Neck supple.  Neurological:     General: No focal deficit present.     Mental Status: He is alert.  Psychiatric:        Mood and Affect: Mood normal.        Behavior: Behavior normal.     ------------------------------------------------------------------------------------------------------------------------------------------------------------------------------------------------------------------- Assessment and Plan  COPD with exacerbation ([place]) Treated with combination of prednisone and Augmentin at this commendations worked well for him previously.  Contact his pharmacy and he does have plenty of refills on file for his inhalers.  Instructed him to pick these up.  Call for follow-up if symptoms or not improving.  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  He has appointment with ENT tomorrow.   Meds ordered this encounter  Medications   cetirizine (ZYRTEC) 10 MG tablet    Sig: Take 1 tablet (10 mg total) by mouth daily.    Dispense:  90 tablet    Refill:  3   fluticasone (FLONASE) 50 MCG/ACT nasal spray    Sig:  Place 2 sprays into both nostrils daily.    Dispense:  16 g    Refill:  6   amoxicillin-clavulanate (AUGMENTIN) 875-125 MG tablet    Sig: Take 1 tablet by mouth 2 (two) times daily.    Dispense:  20 tablet    Refill:  0   predniSONE (DELTASONE) 20 MG tablet    Sig: Take 1 tablet (20 mg total) by mouth 2 (two) times daily with a meal for 5 days.    Dispense:  10 tablet    Refill:  0    No follow-ups on file.    This visit occurred during the SARS-CoV-2 public health emergency.  Safety protocols were in place, including screening questions prior to the visit, additional usage of staff PPE, and extensive cleaning of exam room while observing appropriate contact time as indicated for disinfecting solutions.

## 2020-12-20 NOTE — Assessment & Plan Note (Signed)
Treated with combination of prednisone and Augmentin at this commendations worked well for him previously.  Contact his pharmacy and he does have plenty of refills on file for his inhalers.  Instructed him to pick these up.  Call for follow-up if symptoms or not improving.

## 2020-12-24 NOTE — Chronic Care Management (AMB) (Signed)
  Care Management   Note  12/24/2020 Name: Shane Carrillo MRN: [id number] DOB: 08/22/1945  Shane Carrillo is a 75 y.o. year old male who is a primary care patient of [name], DO and is actively engaged with the care management team. I reached out to [name] by phone today to assist with re-scheduling a follow up visit with the RN Case Manager  Follow up plan: 2nd Unsuccessful telephone outreach attempt made. A HIPAA compliant phone message was left for the patient providing contact information and requesting a return call.   Julian Hy, [place] Management  Direct Dial: [phone]

## 2020-12-25 ENCOUNTER — Encounter: Payer: Self-pay | Admitting: Family Medicine

## 2020-12-25 ENCOUNTER — Ambulatory Visit (INDEPENDENT_AMBULATORY_CARE_PROVIDER_SITE_OTHER): Payer: Medicare HMO | Admitting: Family Medicine

## 2020-12-25 VITALS — Ht 65.0 in | Wt 140.7 lb

## 2020-12-25 DIAGNOSIS — J387 Other diseases of larynx: Secondary | ICD-10-CM | POA: Diagnosis not present

## 2020-12-25 DIAGNOSIS — C321 Malignant neoplasm of supraglottis: Secondary | ICD-10-CM

## 2020-12-25 HISTORY — DX: Malignant neoplasm of supraglottis: C32.1

## 2020-12-25 NOTE — Progress Notes (Signed)
RANNIE CRANEY - 75 y.o. male MRN [id number]  Date of birth: Nov 13, 1945  Subjective Chief Complaint  Patient presents with   Results    HPI Damarrion is a 75 year old male here today for follow-up of laryngeal mass.  He had recent visit with ENT who recommended surgical management with chemotherapy of laryngeal mass.  This is pending a PET scan as well.  He did have a CT scan confirming likelihood of malignancy.  He is requesting a referral to another ENT for second opinion regarding this.  He would like to see [facility].  ROS:  A comprehensive ROS was completed and negative except as noted per HPI  Allergies  Allergen Reactions   Atorvastatin Itching   Sildenafil Palpitations   Simvastatin Itching   Aspirin Other (See Comments)    GI upset - Pt states that he is able to take the coated form     Past Medical History:  Diagnosis Date   Aortic ectasia, abdominal ([place]) 08/2016   On medicare screening u/s: Abdominal aortic atherosclerosis and mild ectasia. Maximal diameter 2.8 cm-->repeat u/s 5 yrs.   BPH with obstruction/lower urinary tract symptoms    Chronic cough    upper airway cough syndrome/irritable larynx syndrome->Dr. Melvyn Novas.   COPD (chronic obstructive pulmonary disease) (HCC)    Coronary artery disease    Stent.     DOE (dyspnea on exertion)    spirometry 02/2018 normal, but needs full PFT's per Dr. Melvyn Novas 03/2018.  No bronchodilators indicated as of 03/2018.   GERD (gastroesophageal reflux disease)    Dysphagia   Hypercholesterolemia    Intol of atorva and simva   Hypertension    PAD (peripheral artery disease) (HCC)    Abd aortic athero   Tobacco dependence    Unintentional weight loss 2019   +Dysphagia,dec appetite/abd pain->pt was set to get EGD/colonoscopy late 2019 but he never followed up with GI to get this.    Past Surgical History:  Procedure Laterality Date   ABDOMINAL AORTOGRAM W/LOWER EXTREMITY N/A 02/24/2019   Procedure: ABDOMINAL AORTOGRAM W/LOWER  EXTREMITY;  Surgeon: Lorretta Harp, MD;  Location: [facility];  Service: Cardiovascular;  Laterality: N/A;   CORONARY ANGIOPLASTY WITH STENT PLACEMENT     Dr. Ouida Sills in [place], [place].   FOOT SURGERY     PERIPHERAL VASCULAR INTERVENTION Left 02/24/2019   Procedure: PERIPHERAL VASCULAR INTERVENTION;  Surgeon: Lorretta Harp, MD;  Location: [facility];  Service: Cardiovascular;  Laterality: Left;   ROTATOR CUFF REPAIR      Social History   Socioeconomic History   Marital status: Divorced    Spouse name: Not on file   Number of children: Not on file   Years of education: 12th grade   Highest education level: Not on file  Occupational History    Comment: Retired  Tobacco Use   Smoking status: Former    Packs/day: 1.75    Years: 59.00    Pack years: 103.25    Types: Cigarettes    Quit date: 07/18/2020    Years since quitting: 0.4   Smokeless tobacco: Never  Vaping Use   Vaping Use: Never used  Substance and Sexual Activity   Alcohol use: Not Currently   Drug use: No   Sexual activity: Not on file  Other Topics Concern   Not on file  Social History Narrative   Lives alone. He has 12 grown children. He enjoys [place] race track and the [place].  Social Determinants of Health   Financial Resource Strain: Medium Risk   Difficulty of Paying Living Expenses: Somewhat hard  Food Insecurity: Food Insecurity Present   Worried About [place] in the Last Year: Sometimes true   Ran Out of Food in the Last Year: Sometimes true  Transportation Needs: No Transportation Needs   Lack of Transportation (Medical): No   Lack of Transportation (Non-Medical): No  Physical Activity: Insufficiently Active   Days of Exercise per Week: 3 days   Minutes of Exercise per Session: 30 min  Stress: No Stress Concern Present   Feeling of Stress : Only a little  Social Connections: Socially Isolated   Frequency of Communication with Friends and Family: Three times  a week   Frequency of Social Gatherings with Friends and Family: Once a week   Attends Religious Services: Never   Marine scientist or Organizations: No   Attends Music therapist: Never   Marital Status: Divorced    Family History  Problem Relation Age of Onset   Cancer - Cervical Mother    Colon cancer Mother    Colon cancer Father    Prostate cancer Father    Diabetes Maternal Grandmother     Health Maintenance  Topic Date Due   COVID-19 Vaccine (1) Never done   Zoster Vaccines- Shingrix (1 of 2) Never done   Pneumonia Vaccine 47+ Years old (2 - PPSV23 if available, else PCV20) 02/27/2017   TETANUS/TDAP  06/19/2021 (Originally 01/07/2020)   Fecal DNA (Cologuard)  06/19/2021 (Originally 03/21/2019)   Hepatitis C Screening  08/21/2021 (Originally 01/05/1965)   HPV VACCINES  Aged Out   INFLUENZA VACCINE  Discontinued     ----------------------------------------------------------------------------------------------------------------------------------------------------------------------------------------------------------------- Physical Exam Ht 5\' 5"  (1.651 m)   Wt 140 lb 11.2 oz (63.8 kg)   BMI 23.41 kg/m   Physical Exam Constitutional:      Appearance: Normal appearance.  Neurological:     General: No focal deficit present.     Mental Status: He is alert.    ------------------------------------------------------------------------------------------------------------------------------------------------------------------------------------------------------------------- Assessment and Plan  [facility]alignant [facility]) Referral placed to ENT at [facility] for second opinion regarding management of malignancy.   No orders of the defined types were placed in this encounter.   No follow-ups on file.    This visit occurred during the SARS-CoV-2 public health emergency.  Safety protocols were in place, including screening questions  prior to the visit, additional usage of staff PPE, and extensive cleaning of exam room while observing appropriate contact time as indicated for disinfecting solutions.

## 2020-12-25 NOTE — Assessment & Plan Note (Signed)
Referral placed to ENT at [facility] for second opinion regarding management of malignancy.

## 2020-12-26 ENCOUNTER — Telehealth: Payer: Medicare HMO

## 2020-12-26 ENCOUNTER — Telehealth: Payer: Self-pay

## 2020-12-26 NOTE — Telephone Encounter (Signed)
  Care Management   Follow Up Note   12/26/2020 Name: CLOYDE OREGEL MRN: [id number] DOB: 03/16/1945   Referred by: [name], DO Reason for referral : Chronic Care Management (RNCM follow up)   An unsuccessful telephone outreach was attempted today. The patient was referred to the case management team for assistance with care management and care coordination.   Follow Up Plan: The care management team will reach out to the patient again over the next 30 days.   Thea Silversmith, RN, MSN, BSN, CCM Care Management Coordinator [facility] [phone]

## 2020-12-31 NOTE — Chronic Care Management (AMB) (Signed)
  Care Management   Note  12/31/2020 Name: Shane Carrillo MRN: [id number] DOB: 03-25-1945  Shane Carrillo is a 75 y.o. year old male who is a primary care patient of [name], DO and is actively engaged with the care management team. I reached out to [name] by phone today to assist with re-scheduling a follow up visit with the RN Case Manager  Follow up plan: Unsuccessful telephone outreach attempt made. A HIPAA compliant phone message was left for the patient providing contact information and requesting a return call.   Julian Hy, [place] Management  Direct Dial: [phone]

## 2021-01-02 ENCOUNTER — Telehealth: Payer: Self-pay

## 2021-01-02 NOTE — Chronic Care Management (AMB) (Signed)
  Care Management   Note  01/02/2021 Name: Shane Carrillo MRN: [id number] DOB: [birth year]  Shane Carrillo is a 75 y.o. year old male who is a primary care patient of [name], DO and is actively engaged with the care management team. I reached out to [name] by phone today to assist with re-scheduling a follow up visit with the RN Case Manager  Follow up plan: 3rd Unsuccessful telephone outreach attempt made. A HIPAA compliant phone message was left for the patient providing contact information and requesting a return call.   Julian Hy, [facility]  Direct Dial: [phone]

## 2021-01-02 NOTE — Telephone Encounter (Signed)
Referral was sent to [facility].   O2 sats are not indicative of need for O2 currently.  We can have him come in and walk him to see if sats decrease.

## 2021-01-02 NOTE — Telephone Encounter (Signed)
Pt called stating no contact from [facility]. (Feaster please advise)  Requesting portable O2 due to wheezing.

## 2021-01-07 NOTE — Telephone Encounter (Signed)
I called [facility] ENT and they stated they sent a text and called patient 3 times and left messages for him to call and schedule after verifying the number they were calling I found out they were calling an incorrect number. They stated they would call the patient one more time at the correct number but it would be the last call.   I called patient today and gave him the number to ENT office and told him to go ahead and call now to schedule his appointment. I also told him if he had any trouble to call our office back and let us know. He stated he understood. - CF

## 2021-01-16 ENCOUNTER — Telehealth: Payer: Self-pay

## 2021-01-16 ENCOUNTER — Ambulatory Visit (INDEPENDENT_AMBULATORY_CARE_PROVIDER_SITE_OTHER): Payer: Medicare HMO

## 2021-01-16 DIAGNOSIS — J387 Other diseases of larynx: Secondary | ICD-10-CM

## 2021-01-16 DIAGNOSIS — J441 Chronic obstructive pulmonary disease with (acute) exacerbation: Secondary | ICD-10-CM

## 2021-01-16 NOTE — Telephone Encounter (Signed)
  Care Management   Follow Up Note   01/16/2021 Name: Shane Carrillo MRN: [id number] DOB: 1945-06-24   Referred by: [name], DO Reason for referral : Chronic Care Management   An unsuccessful telephone outreach was attempted today. The patient was referred to the case management team for assistance with care management and care coordination. RNCM notes 3 additional unsuccessful outreach attempts per Care Guide to reschedule telephone visit.   Follow Up Plan: RNCM will close case.  Thea Silversmith, RN, MSN, BSN, CCM Care Management Coordinator [facility] [phone]

## 2021-01-16 NOTE — Chronic Care Management (AMB) (Signed)
Chronic Care Management   CCM RN Visit Note  01/16/2021 Name: Shane Carrillo MRN: [id number] DOB: 1945-07-23  Subjective: Shane Carrillo is a 75 y.o. year old male who is a primary care patient of [name], DO. The care management team was consulted for assistance with disease management and care coordination needs.    Unsuccessful outreach attempts x2 per [facility]. Unsuccessful outreach attempts x3 per Care Guide to reschedule telephone visit with RNCM. No return response. RNCM will close case.    Consent to Services:  The patient was given information about Chronic Care Management services, agreed to services, and gave verbal consent prior to initiation of services.  Please see initial visit note for detailed documentation.   Patient agreed to services and verbal consent obtained.   Assessment: Review of patient past medical history, allergies, medications, health status, including review of consultants reports, laboratory and other test data, was performed as part of comprehensive evaluation and provision of chronic care management services.   SDOH (Social Determinants of Health) assessments and interventions performed:    CCM Care Plan  Allergies  Allergen Reactions   Atorvastatin Itching   Sildenafil Palpitations   Simvastatin Itching   Aspirin Other (See Comments)    GI upset - Pt states that he is able to take the coated form     Outpatient Encounter Medications as of 01/16/2021  Medication Sig Note   albuterol (PROAIR HFA) 108 (90 Base) MCG/ACT inhaler Inhale 2 puffs into the lungs every 6 (six) hours as needed for wheezing or shortness of breath.    amLODipine (NORVASC) 10 MG tablet Take 1 tablet (10 mg total) by mouth daily.    amoxicillin-clavulanate (AUGMENTIN) 875-125 MG tablet Take 1 tablet by mouth 2 (two) times daily.    aspirin 325 MG tablet Take by mouth.    aspirin EC 325 MG tablet Take 325 mg by mouth daily.    baclofen (LIORESAL) 10 MG tablet Take 1  tablet (10 mg total) by mouth 3 (three) times daily as needed for muscle spasms.    cetirizine (ZYRTEC) 10 MG tablet Take 1 tablet (10 mg total) by mouth daily.    cetirizine (ZYRTEC) 10 MG tablet Take by mouth.    clopidogrel (PLAVIX) 75 MG tablet Take 75 mg by mouth daily.    docusate sodium (COLACE) 100 MG capsule Take 1 capsule (100 mg total) by mouth 2 (two) times daily. (Patient taking differently: Take 200 mg by mouth every other day.)    fluticasone (FLONASE) 50 MCG/ACT nasal spray Place 2 sprays into both nostrils daily.    Fluticasone-Umeclidin-Vilant (TRELEGY ELLIPTA) 100-62.5-25 MCG/INH AEPB One inhalation once daily.    lidocaine (XYLOCAINE) 2 % jelly Apply topically tid (plz sched with new provider for future fills) 11/21/2020: Reports uses as needed.   losartan (COZAAR) 50 MG tablet Take 1 tablet (50 mg total) by mouth daily.    mirtazapine (REMERON) 15 MG tablet Take 1 tablet (15 mg total) by mouth at bedtime.    Omega-3 Fatty Acids (FISH OIL) 1000 MG CAPS Take 1 g by mouth daily.    ondansetron (ZOFRAN-ODT) 4 MG disintegrating tablet Take 4 mg by mouth every 8 (eight) hours as needed for nausea/vomiting.    pantoprazole (PROTONIX) 40 MG tablet Take 1 tablet (40 mg total) by mouth daily.    potassium chloride (K-DUR) 10 MEQ tablet Take 1 tablet (10 mEq total) by mouth daily. (Patient taking differently: Take 10 mEq by mouth See admin instructions. Three  times a week)    rosuvastatin (CRESTOR) 40 MG tablet Take 1 tablet (40 mg total) by mouth daily.    sildenafil (VIAGRA) 100 MG tablet TAKE ONE TABLET BY MOUTH DAILY AS NEEDED FOR ERECTILE DYSFUNCTION    tamsulosin (FLOMAX) 0.4 MG CAPS capsule Take 1 capsule (0.4 mg total) by mouth daily after supper.    No facility-administered encounter medications on file as of 01/16/2021.    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   Laryngeal mass 12/20/2020   Globus sensation  10/30/2020   Pneumonia due to COVID-19 virus 08/06/2020   COPD with exacerbation ([place]) 06/10/2020   Head injury 01/02/2020   Acute pain of right knee 01/02/2020   COPD (chronic obstructive pulmonary disease) ([place]) 12/16/2019   New onset of headaches 11/20/2019   Chest pain, rule out acute myocardial infarction 06/16/2019   Allergic rhinitis 06/03/2019   Rectal bleeding 02/07/2019   Mid back pain 02/01/2019   Peripheral arterial disease ([place]) 01/26/2019   Inflamed external hemorrhoid 11/23/2018   Essential hypertension 11/02/2018   Benign prostatic hyperplasia with urinary frequency 11/02/2018   Abnormal TSH 11/02/2018   CAD (coronary artery disease) 11/02/2018   Acute coronary syndrome ([place]) 10/09/2018   Cigarette smoker 02/26/2018   DOE (dyspnea on exertion) 02/25/2018   Abdominal aortic ectasia ([place]) 08/28/2016   Dysphagia 08/12/2016   Mild intermittent asthma without complication 69/62/9528   Erectile dysfunction 07/30/2009   Hyperlipemia, mixed 01/11/2007    Conditions to be addressed/monitored:HTN and COPD  Care Plan : Chronic Conditions  Updates made by Luretha Rued, RN since 01/16/2021 12:00 AM  Completed 01/16/2021   Problem: Long term care plan for self-management of COPD in a patient with recent covid pneumonia and h/o HTN, HLD, PAD, GERD, STEMI Resolved 01/16/2021  Priority: High     Long-Range Goal: Symptom Exacerbation Prevented or Minimized Completed 01/16/2021  Start Date: 10/03/2020  Expected End Date: 04/05/2021  Recent Progress: On track  Priority: High  Note:   Unsuccessful outreach attempts: goal closed.     Objective:  Last practice recorded BP readings:  BP Readings from Last 3 Encounters:  10/30/20 139/80  09/19/20 131/75  08/21/20 138/78  Most recent lipid panel:     Component Value Date/Time   CHOL 172 06/16/2019 1905   CHOL 186 01/05/2019 1046   TRIG 85 06/16/2019 1905   HDL 85 06/16/2019 1905   HDL 76 01/05/2019 1046   CHOLHDL  2.0 06/16/2019 1905   VLDL 17 06/16/2019 1905   LDLCALC 70 06/16/2019 1905   LDLCALC 97 01/05/2019 1046  Current Barriers:  Knowledge deficits related to long term care plan for self-management of COPD in a patient with a history of HTN, HLD, CAD, PAD,GERD. Covid pneumonia June 2022. Former smoker. Primary concern for Mr Perz today:  Mrs. Alonso states he has just been diagnosed with cancer. Mr. Tolson reports he is being evaluated for cancer in his "throat and vocal chords". He reports a follow up visit is scheduled for tomorrow with Dr. Hendricks Limes (otolaryngology) to discuss plan of care. He is being proactive regarding possible home care needs. He reports he lives alone, adding that he has family in [place], but does not want to move back to [place] and the closest family lives in [place], [place]. He states he is going to need someone to help him with personal care needs such as cooking, cleaning the house pending treatment and his  response to the treatment.  Case Manager Clinical Goal(s): patient will report using inhalers as prescribed including rinsing mouth after use patient will verbalize understanding of COPD action plan and when to seek appropriate levels of medical care  Medications reviewed. Patient encouraged to continue to take as recommended. Patient will obtain Blood Pressure monitor and begin to monitor blood pressure Patient will demonstrate improved adherence to prescribed treatment plan for hypertension as evidenced by taking medications as prescribed, monitoring and recording blood pressure as directed, adhering to low sodium/DASH diet. Interventions:  Collaboration with [name], DO regarding development and update of comprehensive plan of care as evidenced by provider attestation and co-signature Inter-disciplinary care team collaboration (see longitudinal plan of care) RNCM reviewed Medications with patient. Discussed inhaler use. Patient states he has been using his  inhalers as prescribed. Reviewed upcoming/scheduled appointments.  Proactive discussion regarding possible personal care service needs. Patient states he has Medicaid and will call to determine if he is eligible for [facility] service through Medicaid.  Discussed LCSW available if felt overwhelmed or would like to speak with someone regarding coping with new diagnosis Encouraged continued smoking cessation.  Discussed plans for ongoing care management follow up and provided patient with direct contact information for care management team. Self-Care Activities:  Attends scheduled provider appointments Calls pharmacy for medication refills Calls provider office for new concerns or questions Patient Goals: - Contact Medicaid worker to see if you have Personal Care Services benefits. - Continue to eat healthy: whole grains, fruits and vegetables, lean meats, healthy fats, monitor salt intake - Follow COPD action plan and call provider for flare up or if your condition worsens.  - Continue smoking cessation strategies - Please call your Care management team for ongoing care coordination and care management needs.   Unsuccessful Outreach attempts.  Plan: Case Closed. No further follow up required:    Thea Silversmith, RN, MSN, BSN, [facility] Care Management Coordinator [facility] [phone]

## 2021-01-16 NOTE — Progress Notes (Signed)
This encounter was created in error - please disregard.

## 2021-01-16 NOTE — Patient Instructions (Signed)
Visit Information ° °Thank you for allowing me to share the care management and care coordination services that are available to you as part of your health plan and services through your primary care provider and medical home. Please reach out to me at [phone] if the care management/care coordination team may be of assistance to you in the future.  ° °Ainslie Mazurek, RN, MSN, BSN, CCM °Care Management Coordinator °[facility] °[phone]  °

## 2021-01-19 ENCOUNTER — Other Ambulatory Visit: Payer: Self-pay | Admitting: Family Medicine

## 2021-01-19 DIAGNOSIS — N529 Male erectile dysfunction, unspecified: Secondary | ICD-10-CM

## 2021-01-21 ENCOUNTER — Ambulatory Visit: Payer: Medicare HMO | Admitting: Family Medicine

## 2021-01-21 ENCOUNTER — Ambulatory Visit (INDEPENDENT_AMBULATORY_CARE_PROVIDER_SITE_OTHER): Payer: Medicare HMO | Admitting: Family Medicine

## 2021-01-21 ENCOUNTER — Other Ambulatory Visit: Payer: Self-pay

## 2021-01-21 ENCOUNTER — Encounter: Payer: Self-pay | Admitting: Family Medicine

## 2021-01-21 VITALS — BP 146/82 | HR 77 | Ht 65.0 in | Wt 136.0 lb

## 2021-01-21 DIAGNOSIS — Z01818 Encounter for other preprocedural examination: Secondary | ICD-10-CM | POA: Diagnosis not present

## 2021-01-21 DIAGNOSIS — I10 Essential (primary) hypertension: Secondary | ICD-10-CM | POA: Diagnosis not present

## 2021-01-21 NOTE — Patient Instructions (Signed)
Hold Plavix (clopidogrel) and aspirin until surgery.  Restart after surgery.

## 2021-01-21 NOTE — Progress Notes (Signed)
Shane Carrillo - 75 y.o. male MRN [id number]  Date of birth: [birth year]  Subjective No chief complaint on file.   HPI Shane Carrillo is a 75 year old male here today to discuss surgical clearance.  He has laryngeal mass which appears to be laryngeal cancer.  Recently referred to [facility] for second opinion regarding treatment.  He has surgery scheduled for laryngectomy and lymph node dissection the end of this week.  He is needing surgical clearance.  He is currently taking Plavix as well as aspirin.  He has no history of heart attack or stroke.  He did have a coronary stent placed several years ago.  He denies any anginal symptoms.  He does have coexisting COPD and peripheral vascular disease as well.  He is able to walk a few blocks without chest pain.  He does have some intermittent dyspnea has had a few COPD exacerbations this year.  He denies any increased shortness of breath at this time.  He does see cardiology and pulmonology.  He has had no problems with anesthesia previously.  ROS:  A comprehensive ROS was completed and negative except as noted per HPI  Allergies  Allergen Reactions   Atorvastatin Itching   Sildenafil Palpitations   Simvastatin Itching   Aspirin Other (See Comments)    GI upset - Pt states that he is able to take the coated form     Past Medical History:  Diagnosis Date   Aortic ectasia, abdominal ([place]) 08/2016   On medicare screening u/s: Abdominal aortic atherosclerosis and mild ectasia. Maximal diameter 2.8 cm-->repeat u/s 5 yrs.   BPH with obstruction/lower urinary tract symptoms    Chronic cough    upper airway cough syndrome/irritable larynx syndrome->Dr. Melvyn Novas.   COPD (chronic obstructive pulmonary disease) (HCC)    Coronary artery disease    Stent.     DOE (dyspnea on exertion)    spirometry 02/2018 normal, but needs full PFT's per Dr. Melvyn Novas 03/2018.  No bronchodilators indicated as of 03/2018.   GERD (gastroesophageal reflux disease)    Dysphagia    Hypercholesterolemia    Intol of atorva and simva   Hypertension    PAD (peripheral artery disease) (HCC)    Abd aortic athero   Tobacco dependence    Unintentional weight loss 2019   +Dysphagia,dec appetite/abd pain->pt was set to get EGD/colonoscopy late 2019 but he never followed up with GI to get this.    Past Surgical History:  Procedure Laterality Date   ABDOMINAL AORTOGRAM W/LOWER EXTREMITY N/A 02/24/2019   Procedure: ABDOMINAL AORTOGRAM W/LOWER EXTREMITY;  Surgeon: Lorretta Harp, MD;  Location: [facility];  Service: Cardiovascular;  Laterality: N/A;   CORONARY ANGIOPLASTY WITH STENT PLACEMENT     Dr. Ouida Sills in [place], [place].   FOOT SURGERY     PERIPHERAL VASCULAR INTERVENTION Left 02/24/2019   Procedure: PERIPHERAL VASCULAR INTERVENTION;  Surgeon: Lorretta Harp, MD;  Location: [facility];  Service: Cardiovascular;  Laterality: Left;   ROTATOR CUFF REPAIR      Social History   Socioeconomic History   Marital status: Divorced    Spouse name: Not on file   Number of children: Not on file   Years of education: 12th grade   Highest education level: Not on file  Occupational History    Comment: Retired  Tobacco Use   Smoking status: Former    Packs/day: 1.75    Years: 59.00    Pack years: 103.25    Types: Cigarettes  Quit date: 07/18/2020    Years since quitting: 0.5   Smokeless tobacco: Never  Vaping Use   Vaping Use: Never used  Substance and Sexual Activity   Alcohol use: Not Currently   Drug use: No   Sexual activity: Not on file  Other Topics Concern   Not on file  Social History Narrative   Lives alone. He has 12 grown children. He [place] and the [place].   Social Determinants of Health   Financial Resource Strain: Medium Risk   Difficulty of Paying Living Expenses: Somewhat hard  Food Insecurity: Food Insecurity Present   Worried About [place] in the Last Year: Sometimes true   Ran Out of Food  in the Last Year: Sometimes true  Transportation Needs: No Transportation Needs   Lack of Transportation (Medical): No   Lack of Transportation (Non-Medical): No  Physical Activity: Insufficiently Active   Days of Exercise per Week: 3 days   Minutes of Exercise per Session: 30 min  Stress: No Stress Concern Present   Feeling of Stress : Only a little  Social Connections: Socially Isolated   Frequency of Communication with Friends and Family: Three times a week   Frequency of Social Gatherings with Friends and Family: Once a week   Attends Religious Services: Never   Marine scientist or Organizations: No   Attends Music therapist: Never   Marital Status: Divorced    Family History  Problem Relation Age of Onset   Cancer - Cervical Mother    Colon cancer Mother    Colon cancer Father    Prostate cancer Father    Diabetes Maternal Grandmother     Health Maintenance  Topic Date Due   COVID-19 Vaccine (1) Never done   Zoster Vaccines- Shingrix (1 of 2) Never done   Pneumonia Vaccine 32+ Years old (2 - PPSV23 if available, else PCV20) 02/27/2017   TETANUS/TDAP  06/19/2021 (Originally 01/07/2020)   Fecal DNA (Cologuard)  06/19/2021 (Originally 03/21/2019)   Hepatitis C Screening  08/21/2021 (Originally 01/05/1964)   HPV VACCINES  Aged Out   INFLUENZA VACCINE  Discontinued     ----------------------------------------------------------------------------------------------------------------------------------------------------------------------------------------------------------------- Physical Exam BP (!) 146/82 (BP Location: Left Arm, Patient Position: Sitting, Cuff Size: Normal)   Pulse 77   Ht 5\' 5"  (1.651 m)   Wt 136 lb (61.7 kg)   SpO2 98%   BMI 22.63 kg/m   Physical Exam Constitutional:      Appearance: Normal appearance.  Eyes:     General: No scleral icterus. Cardiovascular:     Rate and Rhythm: Normal rate and regular rhythm.  Pulmonary:      Effort: Pulmonary effort is normal.     Breath sounds: Normal breath sounds.  Musculoskeletal:     Cervical back: Neck supple.  Neurological:     General: No focal deficit present.     Mental Status: He is alert.  Psychiatric:        Mood and Affect: Mood normal.        Behavior: Behavior normal.    ------------------------------------------------------------------------------------------------------------------------------------------------------------------------------------------------------------------- Assessment and Plan  Preoperative clearance Surgery scheduled at the end of this week.  Labs ordered today.  Revised cardiac risk index of 1.  He is at moderate risk for complications related to surgery due to his vascular disease and COPD.  I think it would be appropriate to proceed with surgery given urgency of situation.  I am going to have him hold  his Plavix and aspirin in anticipation of upcoming surgery.  He will resume this postoperatively.   No orders of the defined types were placed in this encounter.   No follow-ups on file.    This visit occurred during the SARS-CoV-2 public health emergency.  Safety protocols were in place, including screening questions prior to the visit, additional usage of staff PPE, and extensive cleaning of exam room while observing appropriate contact time as indicated for disinfecting solutions.

## 2021-01-21 NOTE — Assessment & Plan Note (Signed)
Surgery scheduled at the end of this week.  Labs ordered today.  Revised cardiac risk index of 1.  He is at moderate risk for complications related to surgery due to his vascular disease and COPD.  I think it would be appropriate to proceed with surgery given urgency of situation.  I am going to have him hold his Plavix and aspirin in anticipation of upcoming surgery.  He will resume this postoperatively.

## 2021-01-22 ENCOUNTER — Ambulatory Visit (INDEPENDENT_AMBULATORY_CARE_PROVIDER_SITE_OTHER): Payer: Medicare HMO | Admitting: Family Medicine

## 2021-01-22 DIAGNOSIS — Z1152 Encounter for screening for COVID-19: Secondary | ICD-10-CM

## 2021-01-22 LAB — BASIC METABOLIC PANEL
BUN: 22 mg/dL (ref 7–25)
CO2: 27 mmol/L (ref 20–32)
Calcium: 9.3 mg/dL (ref 8.6–10.3)
Chloride: 105 mmol/L (ref 98–110)
Creat: 1.03 mg/dL (ref 0.70–1.28)
Glucose, Bld: 85 mg/dL (ref 65–99)
Potassium: 4.5 mmol/L (ref 3.5–5.3)
Sodium: 139 mmol/L (ref 135–146)

## 2021-01-22 LAB — CBC WITH DIFFERENTIAL/PLATELET
Absolute Monocytes: 542 cells/uL (ref 200–950)
Basophils Absolute: 40 cells/uL (ref 0–200)
Basophils Relative: 0.7 %
Eosinophils Absolute: 160 cells/uL (ref 15–500)
Eosinophils Relative: 2.8 %
HCT: 41.5 % (ref 38.5–50.0)
Hemoglobin: 13.7 g/dL (ref 13.2–17.1)
Lymphs Abs: 2246 cells/uL (ref 850–3900)
MCH: 27.2 pg (ref 27.0–33.0)
MCHC: 33 g/dL (ref 32.0–36.0)
MCV: 82.5 fL (ref 80.0–100.0)
MPV: 10 fL (ref 7.5–12.5)
Monocytes Relative: 9.5 %
Neutro Abs: 2713 cells/uL (ref 1500–7800)
Neutrophils Relative %: 47.6 %
Platelets: 316 10*3/uL (ref 140–400)
RBC: 5.03 10*6/uL (ref 4.20–5.80)
RDW: 14.3 % (ref 11.0–15.0)
Total Lymphocyte: 39.4 %
WBC: 5.7 10*3/uL (ref 3.8–10.8)

## 2021-01-22 NOTE — Progress Notes (Signed)
Medical screening examination/treatment was performed by qualified clinical staff member and as supervising physician I was immediately available for consultation/collaboration. I have reviewed documentation and agree with assessment and plan.  Rocky Gladden, DO  

## 2021-01-22 NOTE — Progress Notes (Signed)
Covid swab only. Surgical clearance.

## 2021-01-23 LAB — NOVEL CORONAVIRUS, NAA: SARS-CoV-2, NAA: NOT DETECTED

## 2021-01-31 NOTE — Progress Notes (Signed)
Pt and spouse informed of results.  Pt and spouse expressed understanding.  Pt has already had surgery so pre-op forms are no longer needed.  Charyl Bigger, CMA

## 2021-02-08 ENCOUNTER — Telehealth: Payer: Self-pay | Admitting: General Practice

## 2021-02-08 NOTE — Telephone Encounter (Signed)
Transition Care Management Unsuccessful Follow-up Telephone Call  Date of discharge and from where:  02/06/21 from [facility] center  Attempts:  1st Attempt  Reason for unsuccessful TCM follow-up call:  Left voice message

## 2021-02-12 ENCOUNTER — Telehealth: Payer: Self-pay | Admitting: *Deleted

## 2021-02-12 NOTE — Telephone Encounter (Signed)
I would have them contact the surgeon @ [facility] for [facility]  needs as I have not seen him since he had his surgery.

## 2021-02-12 NOTE — Telephone Encounter (Signed)
Insert <Angry face emoji> .  Ok to give verbal orders but needs follow up with me.    Thanks!

## 2021-02-12 NOTE — Telephone Encounter (Signed)
Spoke with Donnie Aho and was informed that they always contact PCP for orders for ongoing care as surgeons will rarely get involved in anything past the post-op.

## 2021-02-12 NOTE — Telephone Encounter (Signed)
Seth Bake from [facility] left vm requesting verbal orders for home health nursint 1/week for 9 weeks,  a PT/OT eval, and a medical social worker eval.

## 2021-02-13 ENCOUNTER — Telehealth: Payer: Self-pay | Admitting: General Practice

## 2021-02-13 NOTE — Telephone Encounter (Signed)
Transition Care Management Unsuccessful Follow-up Telephone Call  Date of discharge and from where:  02/12/21 from [facility]   Attempts:  1st Attempt  Reason for unsuccessful TCM follow-up call:  Left voice message

## 2021-02-13 NOTE — Telephone Encounter (Signed)
Transition Care Management Unsuccessful Follow-up Telephone Call  Date of discharge and from where:  02/06/21 from [facility] center  Attempts:  2nd Attempt  Reason for unsuccessful TCM follow-up call:  Left voice message

## 2021-02-14 ENCOUNTER — Telehealth: Payer: Self-pay

## 2021-02-14 NOTE — Telephone Encounter (Signed)
Tabitha @ [place] [facility] lvm pertaining to the following:  Increasing nurse visits to 2 x 8 weeks New swelling on left side of neck What type of lubricant to use for trach  Returned the call. Granted VO for 2x x 8 weeks.   Advised nurse to have patient contact surgeon concerning new swelling and trach lubricant.

## 2021-02-15 NOTE — Telephone Encounter (Signed)
Transition Care Management Unsuccessful Follow-up Telephone Call  Date of discharge and from where:  02/06/21 from [facility]  Attempts:  3rd Attempt  Reason for unsuccessful TCM follow-up call:  Left voice message

## 2021-02-15 NOTE — Telephone Encounter (Signed)
Transition Care Management Unsuccessful Follow-up Telephone Call  Date of discharge and from where:  02/12/21  Attempts:  2nd Attempt  Reason for unsuccessful TCM follow-up call:  Left voice message

## 2021-02-19 NOTE — Telephone Encounter (Signed)
Transition Care Management Unsuccessful Follow-up Telephone Call  Date of discharge and from where:  02/12/21 from [facility]  Attempts:  3rd Attempt  Reason for unsuccessful TCM follow-up call:  Left voice message

## 2021-02-22 ENCOUNTER — Telehealth: Payer: Self-pay | Admitting: General Practice

## 2021-02-22 NOTE — Telephone Encounter (Signed)
Transition Care Management Unsuccessful Follow-up Telephone Call  Date of discharge and from where:  02/20/21 from [facility]  Attempts:  1st Attempt  Reason for unsuccessful TCM follow-up call:  Voice mail full

## 2021-02-25 NOTE — Telephone Encounter (Signed)
Transition Care Management Follow-up Telephone Call Date of discharge and from where: 02/20/21 from [facility] How have you been since you were released from the hospital? Denies any more chest pain but feels "so so". Still has nausea and vomiting.  Any questions or concerns? No  Items Reviewed: Did the pt receive and understand the discharge instructions provided? Yes  Medications obtained and verified? No  Other? No  Any new allergies since your discharge? No  Dietary orders reviewed? Yes Do you have support at home? Yes   Home Care and Equipment/Supplies: Were home health services ordered? No He has been assigned to a home health agency from previous visits.  Functional Questionnaire: (I = Independent and D = Dependent) ADLs: D  Bathing/Dressing- D  Meal Prep- I  Eating- I  Maintaining continence- I  Transferring/Ambulation- I  Managing Meds- D  Follow up appointments reviewed:  PCP Hospital f/u appt confirmed? Yes  Scheduled to see Dr. Zigmund Daniel on 02/27/21 @ 0810. [facility] f/u appt confirmed? No   Are transportation arrangements needed? No  If their condition worsens, is the pt aware to call PCP or go to the Emergency Dept.? Yes Was the patient provided with contact information for the PCP's office or ED? Yes Was to pt encouraged to call back with questions or concerns? Yes

## 2021-02-27 ENCOUNTER — Inpatient Hospital Stay: Payer: Medicare HMO | Admitting: Family Medicine

## 2021-03-08 ENCOUNTER — Telehealth: Payer: Self-pay

## 2021-03-08 NOTE — Telephone Encounter (Signed)
[place] @ [facility] is requesting a callback from the PCP concerning Mr. Wessels therapy.   LVM requesting changing to outpt therapy for new trach. She states they are having difficulty keeping up with his supply needs because every time he attends therapy they change his supplies.  Requesting callback @ [phone].

## 2021-03-26 ENCOUNTER — Ambulatory Visit (INDEPENDENT_AMBULATORY_CARE_PROVIDER_SITE_OTHER): Payer: Medicare Other | Admitting: Family Medicine

## 2021-03-26 ENCOUNTER — Other Ambulatory Visit: Payer: Self-pay

## 2021-03-26 ENCOUNTER — Encounter: Payer: Self-pay | Admitting: Family Medicine

## 2021-03-26 DIAGNOSIS — C321 Malignant neoplasm of supraglottis: Secondary | ICD-10-CM | POA: Diagnosis not present

## 2021-03-26 DIAGNOSIS — F4321 Adjustment disorder with depressed mood: Secondary | ICD-10-CM

## 2021-03-26 DIAGNOSIS — K219 Gastro-esophageal reflux disease without esophagitis: Secondary | ICD-10-CM | POA: Insufficient documentation

## 2021-03-26 HISTORY — DX: Adjustment disorder with depressed mood: F43.21

## 2021-03-26 MED ORDER — PANTOPRAZOLE SODIUM 40 MG PO TBEC: 40.0000 mg | DELAYED_RELEASE_TABLET | Freq: Every day | ORAL | 3 refills | Status: AC

## 2021-03-26 MED ORDER — ESCITALOPRAM OXALATE 10 MG PO TABS: ORAL_TABLET | ORAL | 3 refills | Status: DC

## 2021-03-26 NOTE — Patient Instructions (Signed)
Add pantoprazole 40mg  daily for reflux.  Start escitalopram (lexapro) daily.  Start 5mg  for the first week then increase to 10mg .  Contact surgeon regarding narrowing around trach opening.

## 2021-03-26 NOTE — Assessment & Plan Note (Signed)
Status post laryngectomy.  We will continue to follow with his surgeon through [facility].

## 2021-03-26 NOTE — Progress Notes (Signed)
Shane Carrillo - 76 y.o. male MRN [id number]  Date of birth: Aug 25, 1945  Subjective Chief Complaint  Patient presents with   Gastroesophageal Reflux    HPI Shane Carrillo is a 76 year old male here today to discuss increased reflux symptoms.  Since his last visit with me he underwent laryngectomy for laryngeal cancer.  Reports he is recovering well overall however he is noted some increased difficulty with his trach placement.  Feels like trach opening has become more narrow.  He does have a call into his surgeon to follow-up on this.  Main concern is increased reflux.  Previously taking Protonix however has been off of this for a few months.  He denies any associated nausea.  He has not had chest pain or increased dyspnea.  He does report to adjusting to tracheostomy has been difficult.  Feels little more down and anxious.  He would like to try medication to help with this.  ROS:  A comprehensive ROS was completed and negative except as noted per HPI  Allergies  Allergen Reactions   Atorvastatin Itching   Sildenafil Palpitations   Simvastatin Itching   Aspirin Other (See Comments)    GI upset - Pt states that he is able to take the coated form     Past Medical History:  Diagnosis Date   Aortic ectasia, abdominal ([place]) 08/2016   On medicare screening u/s: Abdominal aortic atherosclerosis and mild ectasia. Maximal diameter 2.8 cm-->repeat u/s 5 yrs.   BPH with obstruction/lower urinary tract symptoms    Chronic cough    upper airway cough syndrome/irritable larynx syndrome->Dr. Melvyn Novas.   COPD (chronic obstructive pulmonary disease) (HCC)    Coronary artery disease    Stent.     DOE (dyspnea on exertion)    spirometry 02/2018 normal, but needs full PFT's per Dr. Melvyn Novas 03/2018.  No bronchodilators indicated as of 03/2018.   GERD (gastroesophageal reflux disease)    Dysphagia   Hypercholesterolemia    Intol of atorva and simva   Hypertension    PAD (peripheral artery disease) (HCC)    Abd  aortic athero   Tobacco dependence    Unintentional weight loss 2019   +Dysphagia,dec appetite/abd pain->pt was set to get EGD/colonoscopy late 2019 but he never followed up with GI to get this.    Past Surgical History:  Procedure Laterality Date   ABDOMINAL AORTOGRAM W/LOWER EXTREMITY N/A 02/24/2019   Procedure: ABDOMINAL AORTOGRAM W/LOWER EXTREMITY;  Surgeon: Lorretta Harp, MD;  Location: [facility];  Service: Cardiovascular;  Laterality: N/A;   CORONARY ANGIOPLASTY WITH STENT PLACEMENT     Dr. Ouida Sills in [place], [place].   FOOT SURGERY     PERIPHERAL VASCULAR INTERVENTION Left 02/24/2019   Procedure: PERIPHERAL VASCULAR INTERVENTION;  Surgeon: Lorretta Harp, MD;  Location: [facility];  Service: Cardiovascular;  Laterality: Left;   ROTATOR CUFF REPAIR      Social History   Socioeconomic History   Marital status: Divorced    Spouse name: Not on file   Number of children: Not on file   Years of education: 12th grade   Highest education level: Not on file  Occupational History    Comment: Retired  Tobacco Use   Smoking status: Former    Packs/day: 1.75    Years: 59.00    Pack years: 103.25    Types: Cigarettes    Quit date: 07/18/2020    Years since quitting: 0.6   Smokeless tobacco: Never  Vaping Use  Vaping Use: Never used  Substance and Sexual Activity   Alcohol use: Not Currently   Drug use: No   Sexual activity: Not on file  Other Topics Concern   Not on file  Social History Narrative   Lives alone. He has 12 grown children. He enjoys [place] race track and the casino.   Social Determinants of Health   Financial Resource Strain: Medium Risk   Difficulty of Paying Living Expenses: Somewhat hard  Food Insecurity: Food Insecurity Present   Worried About Industry in the Last Year: Sometimes true   Ran Out of Food in the Last Year: Sometimes true  Transportation Needs: No Transportation Needs   Lack of Transportation  (Medical): No   Lack of Transportation (Non-Medical): No  Physical Activity: Insufficiently Active   Days of Exercise per Week: 3 days   Minutes of Exercise per Session: 30 min  Stress: No Stress Concern Present   Feeling of Stress : Only a little  Social Connections: Socially Isolated   Frequency of Communication with Friends and Family: Three times a week   Frequency of Social Gatherings with Friends and Family: Once a week   Attends Religious Services: Never   Marine scientist or Organizations: No   Attends Music therapist: Never   Marital Status: Divorced    Family History  Problem Relation Age of Onset   Cancer - Cervical Mother    Colon cancer Mother    Colon cancer Father    Prostate cancer Father    Diabetes Maternal Grandmother     Health Maintenance  Topic Date Due   COVID-19 Vaccine (1) Never done   Zoster Vaccines- Shingrix (1 of 2) Never done   Pneumonia Vaccine 37+ Years old (2 - PPSV23 if available, else PCV20) 02/27/2017   TETANUS/TDAP  06/19/2021 (Originally 01/07/2020)   Fecal DNA (Cologuard)  06/19/2021 (Originally 03/21/2019)   Hepatitis C Screening  08/21/2021 (Originally 01/05/1965)   HPV VACCINES  Aged Out   INFLUENZA VACCINE  Discontinued     ----------------------------------------------------------------------------------------------------------------------------------------------------------------------------------------------------------------- Physical Exam BP (!) 148/78 (BP Location: Right Arm, Patient Position: Sitting, Cuff Size: Normal)    Pulse 75    Ht 5\' 5"  (1.651 m)    Wt 137 lb (62.1 kg)    SpO2 100%    BMI 22.80 kg/m   Physical Exam Constitutional:      Appearance: Normal appearance.     Comments: Able to speak some with use of Passy-Muir valve.  HENT:     Head: Normocephalic and atraumatic.  Eyes:     General: No scleral icterus. Neck:     Comments: Tracheostomy in place. Cardiovascular:     Rate and  Rhythm: Normal rate and regular rhythm.  Pulmonary:     Effort: Pulmonary effort is normal.     Breath sounds: Normal breath sounds.  Abdominal:     General: Abdomen is flat. There is no distension.     Palpations: Abdomen is soft.  Neurological:     Mental Status: He is alert.  Psychiatric:        Mood and Affect: Mood normal.        Behavior: Behavior normal.    ------------------------------------------------------------------------------------------------------------------------------------------------------------------------------------------------------------------- Assessment and Plan  Malignant neoplasm of supraglottis ([place]) Status post laryngectomy.  We will continue to follow with his surgeon through [facility].  GERD (gastroesophageal reflux disease) Starting Protonix at 40 mg daily.  Adjustment disorder with depressed mood We discussed options for  management.  Adding Lexapro 5 mg x 1 week with increase to 10 mg thereafter.   Meds ordered this encounter  Medications   pantoprazole (PROTONIX) 40 MG tablet    Sig: Take 1 tablet (40 mg total) by mouth daily.    Dispense:  30 tablet    Refill:  3   escitalopram (LEXAPRO) 10 MG tablet    Sig: Take 5mg  daily x1 week then increase to 10mg .    Dispense:  30 tablet    Refill:  3    Return in about 3 months (around 06/23/2021) for HTN.    This visit occurred during the SARS-CoV-2 public health emergency.  Safety protocols were in place, including screening questions prior to the visit, additional usage of staff PPE, and extensive cleaning of exam room while observing appropriate contact time as indicated for disinfecting solutions.

## 2021-03-26 NOTE — Assessment & Plan Note (Signed)
We discussed options for management.  Adding Lexapro 5 mg x 1 week with increase to 10 mg thereafter.

## 2021-03-26 NOTE — Assessment & Plan Note (Signed)
Starting Protonix at 40 mg daily.

## 2021-04-10 ENCOUNTER — Other Ambulatory Visit: Payer: Self-pay | Admitting: Family Medicine

## 2021-04-10 DIAGNOSIS — N529 Male erectile dysfunction, unspecified: Secondary | ICD-10-CM

## 2021-05-01 ENCOUNTER — Other Ambulatory Visit: Payer: Self-pay

## 2021-05-01 DIAGNOSIS — N529 Male erectile dysfunction, unspecified: Secondary | ICD-10-CM

## 2021-05-01 MED ORDER — SILDENAFIL CITRATE 100 MG PO TABS: ORAL_TABLET | ORAL | 1 refills | Status: DC

## 2021-06-03 ENCOUNTER — Telehealth: Payer: Self-pay | Admitting: General Practice

## 2021-06-03 ENCOUNTER — Ambulatory Visit (INDEPENDENT_AMBULATORY_CARE_PROVIDER_SITE_OTHER): Payer: Medicare Other | Admitting: Physician Assistant

## 2021-06-03 ENCOUNTER — Ambulatory Visit (INDEPENDENT_AMBULATORY_CARE_PROVIDER_SITE_OTHER): Payer: Medicare Other

## 2021-06-03 ENCOUNTER — Encounter: Payer: Self-pay | Admitting: Physician Assistant

## 2021-06-03 VITALS — BP 142/88 | HR 69 | Temp 98.5°F | Ht 65.0 in | Wt 138.0 lb

## 2021-06-03 DIAGNOSIS — R0602 Shortness of breath: Secondary | ICD-10-CM | POA: Diagnosis not present

## 2021-06-03 DIAGNOSIS — Y95 Nosocomial condition: Secondary | ICD-10-CM

## 2021-06-03 DIAGNOSIS — J189 Pneumonia, unspecified organism: Secondary | ICD-10-CM | POA: Insufficient documentation

## 2021-06-03 DIAGNOSIS — J410 Simple chronic bronchitis: Secondary | ICD-10-CM

## 2021-06-03 HISTORY — DX: Pneumonia, unspecified organism: J18.9

## 2021-06-03 MED ORDER — IPRATROPIUM-ALBUTEROL 0.5-2.5 (3) MG/3ML IN SOLN: 3.0000 mL | RESPIRATORY_TRACT | 3 refills | Status: AC | PRN

## 2021-06-03 NOTE — Progress Notes (Signed)
Ok sent duoneb solution to pharmacy.

## 2021-06-03 NOTE — Progress Notes (Signed)
? ?Subjective:  ? ? Patient ID: Shane Carrillo, male    DOB: 1945/08/12, 76 y.o.   MRN: [id number] ? ?HPI ?Pt is a 76 yo male with supraglottic neoplasm and current tracheostomy, COPD who recently was admitted to hospital for pneumonia/SOB on 4/12 and 4/13.  ? ?Hospital Course:  ?76 years old male with PMH of CAD s/p stent, HTN, HLD, laryngeal cancer s/p laryngectomy and tracheostomy, admitted on 05/29/2021 with cough, dyspnea and fatigue for 2 weeks. ? ?He is treated for community-acquired pneumonia. He is afebrile, WBC count improved from 17-14. Lactic acid was normal. CT scan of the chest revealed left lower lobe and bilateral inferior aspect of the lower lobes pneumonia/inflammation. He was initially treated with vancomycin and cefepime IV, later switched to ceftriaxone azithromycin IV. He was evaluated by speech therapy, recommended for regular diet. Procalcitonin mildly elevated 0.22. He is discharged to home on levofloxacin 750 mg daily for 5 more days. ? ?Patient is on chronic prednisone 20 mg daily. Part of her leukocytosis is secondary to chronic steroid use. Indication for this is unknown.  ? ?Pt has 2 pills of prednisone left and finished levaquin. He continues to feel SOB and coughing up mucus and noticed his sputum is blood tinged. He denies any fever, chills. He is using albuterol once a day.  ? ?.. ?Active Ambulatory Problems  ?  Diagnosis Date Noted  ? DOE (dyspnea on exertion) 02/25/2018  ? Cigarette smoker 02/26/2018  ? Acute coronary syndrome ([place]) 10/09/2018  ? Essential hypertension 11/02/2018  ? Benign prostatic hyperplasia with urinary frequency 11/02/2018  ? Abnormal TSH 11/02/2018  ? CAD (coronary artery disease) 11/02/2018  ? Inflamed external hemorrhoid 11/23/2018  ? Peripheral arterial disease ([place]) 01/26/2019  ? Mid back pain 02/01/2019  ? Rectal bleeding 02/07/2019  ? Allergic rhinitis 06/03/2019  ? Chest pain, rule out acute myocardial infarction 06/16/2019  ? Hyperlipemia, mixed  01/11/2007  ? Dysphagia 08/12/2016  ? Erectile dysfunction 07/30/2009  ? Abdominal aortic ectasia ([place]) 08/28/2016  ? New onset of headaches 11/20/2019  ? COPD (chronic obstructive pulmonary disease) ([place]) 12/16/2019  ? Head injury 01/02/2020  ? Acute pain of right knee 01/02/2020  ? Laryngeal mass 12/20/2020  ? Malignant neoplasm of supraglottis ([place]) 12/25/2020  ? Preoperative clearance 01/21/2021  ? GERD (gastroesophageal reflux disease) 03/26/2021  ? Adjustment disorder with depressed mood 03/26/2021  ? SOB (shortness of breath) 06/03/2021  ? Hospital-acquired pneumonia 06/03/2021  ? ?Resolved Ambulatory Problems  ?  Diagnosis Date Noted  ? Chronic cough 03/25/2018  ? Unintended weight loss 11/02/2018  ? Mild intermittent asthma without complication 50/93/2671  ? Asthma exacerbation 11/20/2019  ? COPD exacerbation ([place]) 12/01/2019  ? COPD with exacerbation ([place]) 06/10/2020  ? Pneumonia due to COVID-19 virus 08/06/2020  ? Globus sensation 10/30/2020  ? ?Past Medical History:  ?Diagnosis Date  ? Aortic ectasia, abdominal ([place]) 08/2016  ? BPH with obstruction/lower urinary tract symptoms   ? Coronary artery disease   ? Hypercholesterolemia   ? Hypertension   ? PAD (peripheral artery disease) ([place])   ? Tobacco dependence   ? Unintentional weight loss 2019  ? ? ? ? ? ?Review of Systems ?See HPI.  ?   ?Objective:  ? Physical Exam ?Vitals reviewed.  ?Constitutional:   ?   Appearance: Normal appearance.  ?HENT:  ?   Head: Normocephalic.  ?Cardiovascular:  ?   Rate and Rhythm: Normal rate and regular rhythm.  ?   Pulses: Normal pulses.  ?  Heart sounds: Normal heart sounds.  ?Pulmonary:  ?   Effort: Pulmonary effort is normal.  ?   Comments: Coarse breath sounds with crackles middle lung bilaterally and base. Worse on left than right.  ?Musculoskeletal:  ?   Right lower leg: No edema.  ?   Left lower leg: No edema.  ?Neurological:  ?   General: No focal deficit present.  ?   Mental Status: He is alert and oriented to  person, place, and time.  ?Psychiatric:     ?   Mood and Affect: Mood normal.  ? ? ? ? ? ?   ?Assessment & Plan:  ?..Rahn was seen today for breathing problem. ? ?Diagnoses and all orders for this visit: ? ?Hospital-acquired pneumonia ?-     BASIC METABOLIC PANEL WITH GFR ?-     CBC w/Diff/Platelet ?-     DG Chest 2 View; Future ? ?Simple chronic bronchitis ([place]) ?-     DG Chest 2 View; Future ? ?SOB (shortness of breath) ? ? ?Pt appears stable today and no labored breathing. ?He has tracheostomy which makes it hard to talk ?Pulse ox 99 percent ?No fever ?Normal HR ?Will get CXR to follow up on pneumonia ?Will recheck CBC and BMP ?Will call patient with plan after CXR ?Continue trelegy ?Increase albuterol to every 4 hours as needed for SOB ? ? ? ?

## 2021-06-03 NOTE — Progress Notes (Signed)
Pneumonia has resolved. No need for more antibiotics. Possible pulmonary nodule or nipple shadow. Suggested repeat chest xray with nipple marker.  ? ?We could consider getting you a nebulizer instead of inhaler to use for shortness of breath and to get mucus up. Is patient ok with this?

## 2021-06-03 NOTE — Patient Instructions (Signed)
Continue on trelegy ?Increase albuterol rescue to every 4hours as needed ?Get labs ?Get CXR  ?Will call with results.  ?

## 2021-06-03 NOTE — Telephone Encounter (Signed)
Transition Care Management Follow-up Telephone Call ?Date of discharge and from where: 05/30/21 from [facility] ?How have you been since you were released from the hospital? Patient had OV with Iran Planas PA today. ?Any questions or concerns? No ? ?

## 2021-06-04 ENCOUNTER — Ambulatory Visit: Payer: Medicare Other | Admitting: Family Medicine

## 2021-06-04 NOTE — Progress Notes (Signed)
WBC is reassuring and normal. It has gone done a lot from the hospital.  ?Electrolytes looks good.  ?Kidney function looks good.  ?Hemoglobin is low, please add ferritin and serum iron. It is stable from hospital. Increasing iron rich foods in diet. Drink more boost/ensure at least 2 a day.  ? ?Follow up with PcP in 1 week for recheck.

## 2021-06-06 LAB — CBC WITH DIFFERENTIAL/PLATELET
Absolute Monocytes: 680 cells/uL (ref 200–950)
Basophils Absolute: 50 cells/uL (ref 0–200)
Basophils Relative: 0.8 %
Eosinophils Absolute: 271 cells/uL (ref 15–500)
Eosinophils Relative: 4.3 %
HCT: 37.1 % — ABNORMAL LOW (ref 38.5–50.0)
Hemoglobin: 11.4 g/dL — ABNORMAL LOW (ref 13.2–17.1)
Lymphs Abs: 2054 cells/uL (ref 850–3900)
MCH: 23.4 pg — ABNORMAL LOW (ref 27.0–33.0)
MCHC: 30.7 g/dL — ABNORMAL LOW (ref 32.0–36.0)
MCV: 76.2 fL — ABNORMAL LOW (ref 80.0–100.0)
MPV: 9.5 fL (ref 7.5–12.5)
Monocytes Relative: 10.8 %
Neutro Abs: 3245 cells/uL (ref 1500–7800)
Neutrophils Relative %: 51.5 %
Platelets: 467 10*3/uL — ABNORMAL HIGH (ref 140–400)
RBC: 4.87 10*6/uL (ref 4.20–5.80)
RDW: 13.6 % (ref 11.0–15.0)
Total Lymphocyte: 32.6 %
WBC: 6.3 10*3/uL (ref 3.8–10.8)

## 2021-06-06 LAB — IRON,TIBC AND FERRITIN PANEL
%SAT: 5 % (calc) — ABNORMAL LOW (ref 20–48)
Ferritin: 21 ng/mL — ABNORMAL LOW (ref 24–380)
Iron: 22 ug/dL — ABNORMAL LOW (ref 50–180)
TIBC: 403 mcg/dL (calc) (ref 250–425)

## 2021-06-06 LAB — BASIC METABOLIC PANEL WITH GFR
BUN: 21 mg/dL (ref 7–25)
CO2: 26 mmol/L (ref 20–32)
Calcium: 9.3 mg/dL (ref 8.6–10.3)
Chloride: 106 mmol/L (ref 98–110)
Creat: 1.21 mg/dL (ref 0.70–1.28)
Glucose, Bld: 70 mg/dL (ref 65–99)
Potassium: 4.5 mmol/L (ref 3.5–5.3)
Sodium: 141 mmol/L (ref 135–146)
eGFR: 63 mL/min/{1.73_m2} (ref >=60)

## 2021-06-06 NOTE — Progress Notes (Signed)
You really need some iron. There is liquid iron. '325mg'$  of ferrous sulfate twice a day with meals.

## 2021-06-12 ENCOUNTER — Ambulatory Visit (INDEPENDENT_AMBULATORY_CARE_PROVIDER_SITE_OTHER): Payer: Medicare Other

## 2021-06-12 ENCOUNTER — Ambulatory Visit: Payer: Medicare Other | Admitting: Family Medicine

## 2021-06-12 ENCOUNTER — Encounter: Payer: Self-pay | Admitting: Family Medicine

## 2021-06-12 VITALS — BP 155/80 | HR 82 | Ht 65.0 in | Wt 140.0 lb

## 2021-06-12 DIAGNOSIS — D509 Iron deficiency anemia, unspecified: Secondary | ICD-10-CM

## 2021-06-12 DIAGNOSIS — R9389 Abnormal findings on diagnostic imaging of other specified body structures: Secondary | ICD-10-CM

## 2021-06-12 DIAGNOSIS — J410 Simple chronic bronchitis: Secondary | ICD-10-CM | POA: Diagnosis not present

## 2021-06-12 HISTORY — DX: Iron deficiency anemia, unspecified: D50.9

## 2021-06-12 MED ORDER — TRELEGY ELLIPTA 100-62.5-25 MCG/ACT IN AEPB: 1.0000 | INHALATION_SPRAY | Freq: Every day | RESPIRATORY_TRACT | 0 refills | Status: DC

## 2021-06-12 NOTE — Assessment & Plan Note (Signed)
Recommend starting iron supplement daily. ?

## 2021-06-12 NOTE — Patient Instructions (Signed)
Use trelegy daily-this is your maintenance inhaler.  ?Continue nebulizer as needed.  ?Stop downstairs and have xray repeated.  ?

## 2021-06-12 NOTE — Progress Notes (Signed)
?Shane Carrillo - 76 y.o. male MRN [id number]  Date of birth: 1945-03-01 ? ?Subjective ?Chief Complaint  ?Patient presents with  ? Follow-up  ? ? ?HPI ?Shane Carrillo is a 76 year old male here today for follow-up visit.  He was seen by Shane Carrillo last week in our clinic for hospital follow-up.  History of supraglottic neoplasm with tracheostomy.  Had hospital admission on 412 for shortness of breath and pneumonia.  Completed course of antibiotics.  Had complained of increased shortness of breath and increased mucus.  Noted that his mucus had blood streaked in his well.  Repeat chest x-ray showed that pneumonia had cleared however there may be a nodule.  Repeat x-ray with nipple markers recommended.  He did have follow-up with his ENT recently and states that he was told that it was normal for to have some blood and his trach suction at times.  Recent labs significant for iron deficiency.  He has not started an iron supplement.  He continues to have increased mucus production with some dyspnea at times.  He is using nebulized albuterol.  He reports he is not using Trelegy. ? ?ROS:  A comprehensive ROS was completed and negative except as noted per HPI ? ?Allergies  ?Allergen Reactions  ? Atorvastatin Itching  ? Sildenafil Palpitations  ? Simvastatin Itching  ? Aspirin Other (See Comments)  ?  GI upset - Pt states that he is able to take the coated form   ? ? ?Past Medical History:  ?Diagnosis Date  ? Aortic ectasia, abdominal ([place]) 08/2016  ? On medicare screening u/s: Abdominal aortic atherosclerosis and mild ectasia. Maximal diameter 2.8 cm-->repeat u/s 5 yrs.  ? BPH with obstruction/lower urinary tract symptoms   ? Chronic cough   ? upper airway cough syndrome/irritable larynx syndrome->Dr. Melvyn Carrillo.  ? COPD (chronic obstructive pulmonary disease) ([place])   ? Coronary artery disease   ? Stent.    ? DOE (dyspnea on exertion)   ? spirometry 02/2018 normal, but needs full PFT's per Dr. Melvyn Carrillo 03/2018.  No bronchodilators indicated as of  03/2018.  ? GERD (gastroesophageal reflux disease)   ? Dysphagia  ? Hypercholesterolemia   ? Intol of atorva and simva  ? Hypertension   ? PAD (peripheral artery disease) ([place])   ? Abd aortic athero  ? Tobacco dependence   ? Unintentional weight loss 2019  ? +Dysphagia,dec appetite/abd pain->pt was set to get EGD/colonoscopy late 2019 but he never followed up with GI to get this.  ? ? ?Past Surgical History:  ?Procedure Laterality Date  ? ABDOMINAL AORTOGRAM W/LOWER EXTREMITY N/A 02/24/2019  ? Procedure: ABDOMINAL AORTOGRAM W/LOWER EXTREMITY;  Surgeon: Shane Harp, MD;  Location: [facility];  Service: Cardiovascular;  Laterality: N/A;  ? CORONARY ANGIOPLASTY WITH STENT PLACEMENT    ? Dr. Ouida Carrillo in [place], [place].  ? FOOT SURGERY    ? PERIPHERAL VASCULAR INTERVENTION Left 02/24/2019  ? Procedure: PERIPHERAL VASCULAR INTERVENTION;  Surgeon: Shane Harp, MD;  Location: [facility];  Service: Cardiovascular;  Laterality: Left;  ? ROTATOR CUFF REPAIR    ? ? ?Social History  ? ?Socioeconomic History  ? Marital status: Divorced  ?  Spouse name: Not on file  ? Number of children: Not on file  ? Years of education: 12th grade  ? Highest education level: Not on file  ?Occupational History  ?  Comment: Retired  ?Tobacco Use  ? Smoking status: Former  ?  Packs/day: 1.75  ?  Years: 59.00  ?  Pack years: 103.25  ?  Types: Cigarettes  ?  Quit date: 07/18/2020  ?  Years since quitting: 0.9  ? Smokeless tobacco: Never  ?Vaping Use  ? Vaping Use: Never used  ?Substance and Sexual Activity  ? Alcohol use: Not Currently  ? Drug use: No  ? Sexual activity: Not on file  ?Other Topics Concern  ? Not on file  ?Social History Narrative  ? Lives alone. He has 12 grown children. He enjoys going to the race track and the casino.  ? ?Social Determinants of Health  ? ?Financial Resource Strain: Medium Risk  ? Difficulty of Paying Living Expenses: Somewhat hard  ?Food Insecurity: Food Insecurity Present  ? Worried About  Charity fundraiser in the Last Year: Sometimes true  ? Ran Out of Food in the Last Year: Sometimes true  ?Transportation Needs: No Transportation Needs  ? Lack of Transportation (Medical): No  ? Lack of Transportation (Non-Medical): No  ?Physical Activity: Insufficiently Active  ? Days of Exercise per Week: 3 days  ? Minutes of Exercise per Session: 30 min  ?Stress: No Stress Concern Present  ? Feeling of Stress : Only a little  ?Social Connections: Socially Isolated  ? Frequency of Communication with Friends and Family: Three times a week  ? Frequency of Social Gatherings with Friends and Family: Once a week  ? Attends Religious Services: Never  ? Active Member of Clubs or Organizations: No  ? Attends Archivist Meetings: Never  ? Marital Status: Divorced  ? ? ?Family History  ?Problem Relation Age of Onset  ? Cancer - Cervical Mother   ? Colon cancer Mother   ? Colon cancer Father   ? Prostate cancer Father   ? Diabetes Maternal Grandmother   ? ? ?Health Maintenance  ?Topic Date Due  ? TETANUS/TDAP  06/19/2021 (Originally 01/07/2020)  ? Fecal DNA (Cologuard)  06/19/2021 (Originally 03/21/2019)  ? COVID-19 Vaccine (1) 06/28/2021 (Originally 07/06/1946)  ? Hepatitis C Screening  08/21/2021 (Originally 01/05/1965)  ? Zoster Vaccines- Shingrix (1 of 2) 09/11/2021 (Originally 01/05/1966)  ? Pneumonia Vaccine 36+ Years old (2 - PPSV23 if available, else PCV20) 06/13/2022 (Originally 02/27/2017)  ? HPV VACCINES  Aged Out  ? INFLUENZA VACCINE  Discontinued  ? ? ? ?----------------------------------------------------------------------------------------------------------------------------------------------------------------------------------------------------------------- ?Physical Exam ?BP (!) 155/80 (BP Location: Right Arm, Patient Position: Sitting, Cuff Size: Small)   Pulse 82   Ht '5\' 5"'$  (1.651 m)   Wt 140 lb (63.5 kg)   SpO2 95%   BMI 23.30 kg/m?  ? ?Physical Exam ?Constitutional:   ?   Appearance: Normal  appearance.  ?Eyes:  ?   General: No scleral icterus. ?Cardiovascular:  ?   Rate and Rhythm: Normal rate and regular rhythm.  ?Pulmonary:  ?   Effort: Pulmonary effort is normal.  ?   Breath sounds: Normal breath sounds.  ?Musculoskeletal:  ?   Cervical back: Neck supple.  ?Neurological:  ?   Mental Status: He is alert.  ?Psychiatric:     ?   Mood and Affect: Mood normal.     ?   Behavior: Behavior normal.  ? ? ?------------------------------------------------------------------------------------------------------------------------------------------------------------------------------------------------------------------- ?Assessment and Plan ? ?COPD (chronic obstructive pulmonary disease) ([place]) ?Recommend that he restart Trelegy and use daily.  Samples provided today.  Think this may decrease some of his mucus and secretions.  Continue regular suctioning of his tracheostomy.  He may continue albuterol as needed.  Repeat chest x-ray due to possible nodule.  Follow-up for new or  worsening symptoms. ? ?Iron deficiency anemia ?Recommend starting iron supplement daily. ? ? ?Meds ordered this encounter  ?Medications  ? Fluticasone-Umeclidin-Vilant (TRELEGY ELLIPTA) 100-62.5-25 MCG/ACT AEPB  ?  Sig: Inhale 1 puff into the lungs daily. ExP: 10/2022 ?Lot: [id number]  ?  Dispense:  2 each  ?  Refill:  0  ? ? ?No follow-ups on file. ? ? ? ?This visit occurred during the SARS-CoV-2 public health emergency.  Safety protocols were in place, including screening questions prior to the visit, additional usage of staff PPE, and extensive cleaning of exam room while observing appropriate contact time as indicated for disinfecting solutions.  ? ?

## 2021-06-12 NOTE — Assessment & Plan Note (Signed)
Recommend that he restart Trelegy and use daily.  Samples provided today.  Think this may decrease some of his mucus and secretions.  Continue regular suctioning of his tracheostomy.  He may continue albuterol as needed.  Repeat chest x-ray due to possible nodule.  Follow-up for new or worsening symptoms. ?

## 2021-06-14 NOTE — Progress Notes (Unsigned)
Pt has been advised of results.

## 2021-06-26 ENCOUNTER — Ambulatory Visit: Payer: Medicare Other | Admitting: Family Medicine

## 2021-07-01 ENCOUNTER — Other Ambulatory Visit: Payer: Self-pay | Admitting: Family Medicine

## 2021-07-01 DIAGNOSIS — N529 Male erectile dysfunction, unspecified: Secondary | ICD-10-CM

## 2021-07-29 ENCOUNTER — Ambulatory Visit (INDEPENDENT_AMBULATORY_CARE_PROVIDER_SITE_OTHER): Payer: Medicare Other | Admitting: Family Medicine

## 2021-07-29 ENCOUNTER — Encounter: Payer: Self-pay | Admitting: Family Medicine

## 2021-07-29 DIAGNOSIS — C321 Malignant neoplasm of supraglottis: Secondary | ICD-10-CM | POA: Diagnosis not present

## 2021-07-29 DIAGNOSIS — J410 Simple chronic bronchitis: Secondary | ICD-10-CM | POA: Diagnosis not present

## 2021-07-29 DIAGNOSIS — F4321 Adjustment disorder with depressed mood: Secondary | ICD-10-CM

## 2021-07-29 DIAGNOSIS — I10 Essential (primary) hypertension: Secondary | ICD-10-CM

## 2021-07-29 NOTE — Assessment & Plan Note (Signed)
Status post laryngectomy.  He continues to see [facility] for speech therapy.

## 2021-07-29 NOTE — Progress Notes (Signed)
Shane Carrillo - 76 y.o. male MRN [id number]  Date of birth: November 01, 1945  Subjective Chief Complaint  Patient presents with   Hypertension    HPI Shane Carrillo is a 76 year old male here today for follow-up visit.  He reports that his breathing is doing much better since using Trelegy regularly.  He has not needed his albuterol as often.  He still continues to get some secretions from his tracheostomy.  He did see ENT again who made some adjustments to his tracheostomy.  He has not had any further blood suctioned from this.  He is seeing speech therapy through [facility].  His blood pressure is elevated today.  He is taking Norvasc and losartan regularly.  He denies symptoms related to hypertension including chest pain, shortness of breath, palpitations, headaches or vision changes.  Stress and anxiety has improved with Lexapro.  No side effects at this time.  ROS:  A comprehensive ROS was completed and negative except as noted per HPI  Allergies  Allergen Reactions   Atorvastatin Itching   Sildenafil Palpitations   Simvastatin Itching   Aspirin Other (See Comments)    GI upset - Pt states that he is able to take the coated form     Past Medical History:  Diagnosis Date   Aortic ectasia, abdominal ([place]) 08/2016   On medicare screening u/s: Abdominal aortic atherosclerosis and mild ectasia. Maximal diameter 2.8 cm-->repeat u/s 5 yrs.   BPH with obstruction/lower urinary tract symptoms    Chronic cough    upper airway cough syndrome/irritable larynx syndrome->Dr. Melvyn Novas.   COPD (chronic obstructive pulmonary disease) (HCC)    Coronary artery disease    Stent.     DOE (dyspnea on exertion)    spirometry 02/2018 normal, but needs full PFT's per Dr. Melvyn Novas 03/2018.  No bronchodilators indicated as of 03/2018.   GERD (gastroesophageal reflux disease)    Dysphagia   Hypercholesterolemia    Intol of atorva and simva   Hypertension    PAD (peripheral artery disease) (HCC)    Abd aortic  athero   Tobacco dependence    Unintentional weight loss 2019   +Dysphagia,dec appetite/abd pain->pt was set to get EGD/colonoscopy late 2019 but he never followed up with GI to get this.    Past Surgical History:  Procedure Laterality Date   ABDOMINAL AORTOGRAM W/LOWER EXTREMITY N/A 02/24/2019   Procedure: ABDOMINAL AORTOGRAM W/LOWER EXTREMITY;  Surgeon: Lorretta Harp, MD;  Location: [place] [facility];  Service: Cardiovascular;  Laterality: N/A;   CORONARY ANGIOPLASTY WITH STENT PLACEMENT     Dr. Ouida Sills in [place], [place].   FOOT SURGERY     PERIPHERAL VASCULAR INTERVENTION Left 02/24/2019   Procedure: PERIPHERAL VASCULAR INTERVENTION;  Surgeon: Lorretta Harp, MD;  Location: [place] [facility];  Service: Cardiovascular;  Laterality: Left;   ROTATOR CUFF REPAIR      Social History   Socioeconomic History   Marital status: Divorced    Spouse name: Not on file   Number of children: Not on file   Years of education: 12th grade   Highest education level: Not on file  Occupational History    Comment: Retired  Tobacco Use   Smoking status: Former    Packs/day: 1.75    Years: 59.00    Total pack years: 103.25    Types: Cigarettes    Quit date: 07/18/2020    Years since quitting: 1.0   Smokeless tobacco: Never  Vaping Use   Vaping Use: Never used  Substance and Sexual Activity   Alcohol use: Not Currently   Drug use: No   Sexual activity: Not on file  Other Topics Concern   Not on file  Social History Narrative   Lives alone. He has 12 grown children. He enjoys going [place] the race track and the casino.   Social Determinants of Health   Financial Resource Strain: Medium Risk (08/21/2020)   Overall Financial Resource Strain (CARDIA)    Difficulty of Paying Living Expenses: Somewhat hard  Food Insecurity: Food Insecurity Present (10/03/2020)   Hunger Vital Sign    Worried About Running Out of Food in the Last Year: Sometimes true    Ran Out of Food in the Last Year:  Sometimes true  Transportation Needs: No Transportation Needs (10/03/2020)   PRAPARE - Hydrologist (Medical): No    Lack of Transportation (Non-Medical): No  Physical Activity: Insufficiently Active (08/21/2020)   Exercise Vital Sign    Days of Exercise per Week: 3 days    Minutes of Exercise per Session: 30 min  Stress: No Stress Concern Present (08/21/2020)   [place]    Feeling of Stress : Only a little  Social Connections: Socially Isolated (08/21/2020)   Social Connection and Isolation Panel [NHANES]    Frequency of Communication with Friends and Family: Three times a week    Frequency of Social Gatherings with Friends and Family: Once a week    Attends Religious Services: Never    Marine scientist or Organizations: No    Attends Music therapist: Never    Marital Status: Divorced    Family History  Problem Relation Age of Onset   Cancer - Cervical Mother    Colon cancer Mother    Colon cancer Father    Prostate cancer Father    Diabetes Maternal Grandmother     Health Maintenance  Topic Date Due   Hepatitis C Screening  08/21/2021 (Originally [birth year]/1965)   Zoster Vaccines- Shingrix (1 of 2) 09/11/2021 (Originally 01/05/1966)   COVID-19 Vaccine (1) 10/29/2021 (Originally 07/06/1946)   Pneumonia Vaccine 66+ Years old (2 - PPSV23 if available, else PCV20) 06/13/2022 (Originally 02/27/2017)   TETANUS/TDAP  07/30/2022 (Originally 01/07/2020)   Fecal DNA (Cologuard)  07/30/2022 (Originally 03/21/2019)   HPV VACCINES  Aged Out   INFLUENZA VACCINE  Discontinued     ----------------------------------------------------------------------------------------------------------------------------------------------------------------------------------------------------------------- Physical Exam BP (!) 143/70 (BP Location: Left Arm, Patient Position: Sitting, Cuff Size: Normal)    Pulse 75   Ht '5\' 5"'$  (1.651 m)   Wt 138 lb (62.6 kg)   SpO2 100%   BMI 22.96 kg/m   Physical Exam Constitutional:      Appearance: Normal appearance.  Eyes:     General: No scleral icterus. Cardiovascular:     Rate and Rhythm: Normal rate and regular rhythm.  Pulmonary:     Effort: Pulmonary effort is normal.     Breath sounds: Normal breath sounds.  Musculoskeletal:     Cervical back: Neck supple.  Neurological:     General: No focal deficit present.     Mental Status: He is alert.  Psychiatric:        Mood and Affect: Mood normal.        Behavior: Behavior normal.     ------------------------------------------------------------------------------------------------------------------------------------------------------------------------------------------------------------------- Assessment and Plan  Essential hypertension Blood pressure elevated initially.  Improved on recheck.  Recommend continuation of current medications.  COPD (chronic obstructive pulmonary  disease) ([place]) Decrease secretions as well as improved breathing with regular use of Trelegy.  Recommend continuation.  Continue albuterol and/or DuoNeb's as needed.  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  He continues to see [facility] for speech therapy.  Adjustment disorder with depressed mood Doing well on Lexapro at current strength.  We will continue.   No orders of the defined types were placed in this encounter.   Return in about 3 months (around 10/29/2021) for HTN.    This visit occurred during the SARS-CoV-2 public health emergency.  Safety protocols were in place, including screening questions prior to the visit, additional usage of staff PPE, and extensive cleaning of exam room while observing appropriate contact time as indicated for disinfecting solutions.

## 2021-07-29 NOTE — Assessment & Plan Note (Signed)
Doing well on Lexapro at current strength.  We will continue.

## 2021-07-29 NOTE — Patient Instructions (Signed)
Continue current medications.  See me again in 3-4 months.

## 2021-07-29 NOTE — Assessment & Plan Note (Signed)
Blood pressure elevated initially.  Improved on recheck.  Recommend continuation of current medications.

## 2021-07-29 NOTE — Assessment & Plan Note (Signed)
Decrease secretions as well as improved breathing with regular use of Trelegy.  Recommend continuation.  Continue albuterol and/or DuoNeb's as needed.

## 2021-09-12 ENCOUNTER — Ambulatory Visit (INDEPENDENT_AMBULATORY_CARE_PROVIDER_SITE_OTHER): Payer: Medicare Other

## 2021-09-12 DIAGNOSIS — Z87891 Personal history of nicotine dependence: Secondary | ICD-10-CM | POA: Diagnosis not present

## 2021-09-12 DIAGNOSIS — Z122 Encounter for screening for malignant neoplasm of respiratory organs: Secondary | ICD-10-CM

## 2021-09-12 NOTE — Progress Notes (Signed)
Acute Office Visit  Subjective:     Patient ID: Shane Carrillo, male    DOB: 1945/04/11, 76 y.o.   MRN: [id number]  Chief Complaint  Patient presents with   Numbness    Right arm    Pt presents with right arm numbness and tingling for two weeks. Numbness and tingling is associated with headaches. Symptoms have been intermittent. No trauma to his knowledge. Is not taking any medication for the numbness and tingling. When it becomes numb he doesn't use his right arm. He has headaches located to base of the skull. This has never happened before to his knowledge.   Pt is trach dependent and has noticed an increase in secretions and purulent discharge. Denies fevers or chills. It has been getting worse in the past few weeks.      Review of Systems  Constitutional:  Negative for chills and fever.  Respiratory:  Negative for cough and shortness of breath.   Cardiovascular:  Negative for chest pain.  Neurological:  Positive for tingling and headaches. Negative for weakness.        Objective:    BP 130/72   Pulse 72   Ht '5\' 5"'$  (1.651 m)   Wt 138 lb (62.6 kg)   SpO2 99%   BMI 22.96 kg/m    Physical Exam Vitals and nursing note reviewed.  Constitutional:      General: He is not in acute distress.    Appearance: Normal appearance.  HENT:     Head: Normocephalic and atraumatic.     Right Ear: External ear normal.     Left Ear: External ear normal.     Nose: Nose normal.  Eyes:     Conjunctiva/sclera: Conjunctivae normal.  Neck:     Comments: Trach dependent. Cardiovascular:     Rate and Rhythm: Normal rate and regular rhythm.  Pulmonary:     Effort: Pulmonary effort is normal.     Breath sounds: Normal breath sounds.  Musculoskeletal:     Comments: Negative spurling's bilaterally Pain to palpation of C spine at C6-7 Decreased ROM on active and passive ROM of c spine Based on dermatomal patterns of numbness and tingling from C4-T1  Neurological:     General: No  focal deficit present.     Mental Status: He is alert and oriented to person, place, and time.  Psychiatric:        Mood and Affect: Mood normal.        Behavior: Behavior normal.        Thought Content: Thought content normal.        Judgment: Judgment normal.     No results found for any visits on 09/13/21.      Assessment & Plan:   Problem List Items Addressed This Visit       Nervous and Auditory   Radiculopathy of cervicothoracic region - Primary    - pt's history suggests radiculopathy. Have ordered C and T spine xrays based on physical exam  - offered gabapentin for improvement of numbness and tingling but patient's fiance denied prescription - told pt we would order xrays and see if there are signs of compression or stenosis and make a further plan from there      Relevant Orders   DG Cervical Spine Complete   DG Thoracic Spine W/Swimmers     Other   Tracheostomy dependent ([place])    - pt reports excessive oral secretions from trach have ordered augmentin  -  lung exam normal  - discussed return protocol with patient       Relevant Medications   amoxicillin-clavulanate (AUGMENTIN) 875-125 MG tablet   Other Visit Diagnoses     Excessive oral secretions       Relevant Medications   amoxicillin-clavulanate (AUGMENTIN) 875-125 MG tablet       Meds ordered this encounter  Medications   amoxicillin-clavulanate (AUGMENTIN) 875-125 MG tablet    Sig: Take 1 tablet by mouth 2 (two) times daily.    Dispense:  20 tablet    Refill:  0    Return if symptoms worsen or fail to improve.  Owens Loffler, DO

## 2021-09-13 ENCOUNTER — Ambulatory Visit (INDEPENDENT_AMBULATORY_CARE_PROVIDER_SITE_OTHER): Payer: Medicare Other

## 2021-09-13 ENCOUNTER — Ambulatory Visit (INDEPENDENT_AMBULATORY_CARE_PROVIDER_SITE_OTHER): Payer: Medicare Other | Admitting: Family Medicine

## 2021-09-13 VITALS — BP 130/72 | HR 72 | Ht 65.0 in | Wt 138.0 lb

## 2021-09-13 DIAGNOSIS — M5413 Radiculopathy, cervicothoracic region: Secondary | ICD-10-CM | POA: Insufficient documentation

## 2021-09-13 DIAGNOSIS — M549 Dorsalgia, unspecified: Secondary | ICD-10-CM | POA: Diagnosis not present

## 2021-09-13 DIAGNOSIS — Z930 Tracheostomy status: Secondary | ICD-10-CM

## 2021-09-13 DIAGNOSIS — M542 Cervicalgia: Secondary | ICD-10-CM | POA: Diagnosis not present

## 2021-09-13 DIAGNOSIS — R6889 Other general symptoms and signs: Secondary | ICD-10-CM

## 2021-09-13 HISTORY — DX: Radiculopathy, cervicothoracic region: M54.13

## 2021-09-13 HISTORY — DX: Tracheostomy status: Z93.0

## 2021-09-13 MED ORDER — AMOXICILLIN-POT CLAVULANATE 875-125 MG PO TABS: 1.0000 | ORAL_TABLET | Freq: Two times a day (BID) | ORAL | 0 refills | Status: DC

## 2021-09-13 NOTE — Assessment & Plan Note (Signed)
-   pt's history suggests radiculopathy. Have ordered C and T spine xrays based on physical exam  - offered gabapentin for improvement of numbness and tingling but patient's fiance denied prescription - told pt we would order xrays and see if there are signs of compression or stenosis and make a further plan from there

## 2021-09-13 NOTE — Assessment & Plan Note (Signed)
-   pt reports excessive oral secretions from trach have ordered augmentin  - lung exam normal  - discussed return protocol with patient

## 2021-09-15 ENCOUNTER — Other Ambulatory Visit: Payer: Self-pay | Admitting: Family Medicine

## 2021-09-15 DIAGNOSIS — M4802 Spinal stenosis, cervical region: Secondary | ICD-10-CM

## 2021-09-26 ENCOUNTER — Telehealth: Payer: Self-pay | Admitting: Acute Care

## 2021-09-26 ENCOUNTER — Other Ambulatory Visit: Payer: Self-pay

## 2021-09-26 DIAGNOSIS — Z87891 Personal history of nicotine dependence: Secondary | ICD-10-CM

## 2021-09-26 DIAGNOSIS — Z122 Encounter for screening for malignant neoplasm of respiratory organs: Secondary | ICD-10-CM

## 2021-09-26 NOTE — Telephone Encounter (Signed)
I wanted to let you know the results of Shane Carrillo lung cancer screening. LR 2 , just needs annual screening which we will order and schedule.  He does have notation of Aortic atherosclerosis. Coronary artery calcifications.He is on statin therapy. He has a Stable ascending thoracic aortic aneurysm measuring 4.2 mm. Recommend annual imaging followup by CTA or MRA per radiology. It can be evaluated annually per his lung cancer screening, but please order follow up as you feel is clinically indicated.  Please let me know if you have any questions or concerns. Thanks so much

## 2021-11-05 ENCOUNTER — Ambulatory Visit: Payer: Medicare Other | Admitting: Family Medicine

## 2021-11-07 ENCOUNTER — Encounter: Payer: Self-pay | Admitting: Family Medicine

## 2021-11-07 ENCOUNTER — Ambulatory Visit (INDEPENDENT_AMBULATORY_CARE_PROVIDER_SITE_OTHER): Payer: Medicare Other | Admitting: Family Medicine

## 2021-11-07 DIAGNOSIS — I10 Essential (primary) hypertension: Secondary | ICD-10-CM | POA: Diagnosis not present

## 2021-11-07 DIAGNOSIS — J410 Simple chronic bronchitis: Secondary | ICD-10-CM | POA: Diagnosis not present

## 2021-11-07 NOTE — Assessment & Plan Note (Signed)
Blood pressure remains elevated today.  Increasing losartan to 100 mg daily.  Return in approximately 2 weeks for blood pressure recheck.

## 2021-11-07 NOTE — Patient Instructions (Addendum)
Dr. Ronnald Ramp- [phone] Increase losartan to '100mg'$ .  Return in 2 weeks for BP check

## 2021-11-07 NOTE — Progress Notes (Signed)
Shane Carrillo - 76 y.o. male MRN [id number]  Date of birth: 1945/03/28  Subjective No chief complaint on file.   HPI Shane Carrillo is a 76 year old male here today for follow-up visit.  Reports Shane Carrillo is doing okay at this time.  Has had consultation with neurosurgery and MRI ordered however Shane Carrillo was unable to complete due to feeling of claustrophobia.  Reports they are supposed to set him up for another MRI.  Respiratory symptoms are stable with current inhalers.  Blood pressure elevated today.  Denies symptoms related to this including chest pain, increased shortness of breath, headaches or vision changes.  Shane Carrillo is taking losartan daily and 50 mg.  ROS:  A comprehensive ROS was completed and negative except as noted per HPI  Allergies  Allergen Reactions   Atorvastatin Itching   Sildenafil Palpitations   Simvastatin Itching   Aspirin Other (See Comments)    GI upset - Pt states that Shane Carrillo is able to take the coated form     Past Medical History:  Diagnosis Date   Aortic ectasia, abdominal ([place]) 08/2016   On medicare screening u/s: Abdominal aortic atherosclerosis and mild ectasia. Maximal diameter 2.8 cm-->repeat u/s 5 yrs.   BPH with obstruction/lower urinary tract symptoms    Chronic cough    upper airway cough syndrome/irritable larynx syndrome->Dr. Melvyn Novas.   COPD (chronic obstructive pulmonary disease) (HCC)    Coronary artery disease    Stent.     DOE (dyspnea on exertion)    spirometry 02/2018 normal, but needs full PFT's per Dr. Melvyn Novas 03/2018.  No bronchodilators indicated as of 03/2018.   GERD (gastroesophageal reflux disease)    Dysphagia   Hypercholesterolemia    Intol of atorva and simva   Hypertension    PAD (peripheral artery disease) (HCC)    Abd aortic athero   Tobacco dependence    Unintentional weight loss 2019   +Dysphagia,dec appetite/abd pain->pt was set to get EGD/colonoscopy late 2019 but Shane Carrillo never followed up with GI to get this.    Past Surgical History:   Procedure Laterality Date   ABDOMINAL AORTOGRAM W/LOWER EXTREMITY N/A 02/24/2019   Procedure: ABDOMINAL AORTOGRAM W/LOWER EXTREMITY;  Surgeon: Lorretta Harp, MD;  Location: [place] [facility];  Service: Cardiovascular;  Laterality: N/A;   CORONARY ANGIOPLASTY WITH STENT PLACEMENT     Dr. Ouida Sills in [place], [place].   FOOT SURGERY     PERIPHERAL VASCULAR INTERVENTION Left 02/24/2019   Procedure: PERIPHERAL VASCULAR INTERVENTION;  Surgeon: Lorretta Harp, MD;  Location: [place] [facility];  Service: Cardiovascular;  Laterality: Left;   ROTATOR CUFF REPAIR      Social History   Socioeconomic History   Marital status: Divorced    Spouse name: Not on file   Number of children: Not on file   Years of education: 12th grade   Highest education level: Not on file  Occupational History    Comment: Retired  Tobacco Use   Smoking status: Former    Packs/day: 1.75    Years: 59.00    Total pack years: 103.25    Types: Cigarettes    Quit date: 07/18/2020    Years since quitting: 1.3   Smokeless tobacco: Never  Vaping Use   Vaping Use: Never used  Substance and Sexual Activity   Alcohol use: Not Currently   Drug use: No   Sexual activity: Not on file  Other Topics Concern   Not on file  Social History Narrative   Lives alone.  Shane Carrillo has 12 grown children. Shane Carrillo enjoys going [place] the race track and the casino.   Social Determinants of Health   Financial Resource Strain: Medium Risk (08/21/2020)   Overall Financial Resource Strain (CARDIA)    Difficulty of Paying Living Expenses: Somewhat hard  Food Insecurity: Food Insecurity Present (10/03/2020)   Hunger Vital Sign    Worried About Running Out of Food in the Last Year: Sometimes true    Ran Out of Food in the Last Year: Sometimes true  Transportation Needs: No Transportation Needs (10/03/2020)   PRAPARE - Hydrologist (Medical): No    Lack of Transportation (Non-Medical): No  Physical Activity: Insufficiently  Active (08/21/2020)   Exercise Vital Sign    Days of Exercise per Week: 3 days    Minutes of Exercise per Session: 30 min  Stress: No Stress Concern Present (08/21/2020)   [place]    Feeling of Stress : Only a little  Social Connections: Socially Isolated (08/21/2020)   Social Connection and Isolation Panel [NHANES]    Frequency of Communication with Friends and Family: Three times a week    Frequency of Social Gatherings with Friends and Family: Once a week    Attends Religious Services: Never    Marine scientist or Organizations: No    Attends Music therapist: Never    Marital Status: Divorced    Family History  Problem Relation Age of Onset   Cancer - Cervical Mother    Colon cancer Mother    Colon cancer Father    Prostate cancer Father    Diabetes Maternal Grandmother     Health Maintenance  Topic Date Due   COVID-19 Vaccine (1) 03/20/2022 (Originally 01/06/1951)   Zoster Vaccines- Shingrix (1 of 2) 03/20/2022 (Originally 01/05/1966)   Pneumonia Vaccine 52+ Years old (2 - PPSV23 or PCV20) 06/13/2022 (Originally 02/27/2017)   TETANUS/TDAP  07/30/2022 (Originally 01/07/2020)   Fecal DNA (Cologuard)  07/30/2022 (Originally 03/21/2019)   Hepatitis C Screening  11/08/2022 (Originally 01/05/1965)   HPV VACCINES  Aged Out   INFLUENZA VACCINE  Discontinued     ----------------------------------------------------------------------------------------------------------------------------------------------------------------------------------------------------------------- Physical Exam BP (!) 159/81 (BP Location: Left Arm, Patient Position: Sitting, Cuff Size: Normal)   Pulse 66   Ht '5\' 5"'$  (1.651 m)   Wt 138 lb (62.6 kg)   SpO2 97%   BMI 22.96 kg/m   Physical Exam Constitutional:      Appearance: Normal appearance.  Eyes:     General: No scleral icterus. Cardiovascular:     Rate and Rhythm:  Normal rate and regular rhythm.  Pulmonary:     Effort: Pulmonary effort is normal.     Breath sounds: Normal breath sounds.  Neurological:     Mental Status: Shane Carrillo is alert.  Psychiatric:        Mood and Affect: Mood normal.        Behavior: Behavior normal.     ------------------------------------------------------------------------------------------------------------------------------------------------------------------------------------------------------------------- Assessment and Plan  Essential hypertension Blood pressure remains elevated today.  Increasing losartan to 100 mg daily.  Return in approximately 2 weeks for blood pressure recheck.  COPD (chronic obstructive pulmonary disease) (HCC) Stable with current inhalers.  Recommend continuation.  Shane Carrillo has been doing well on smoking cessation.   No orders of the defined types were placed in this encounter.   Return in about 2 weeks (around 11/21/2021) for BP check-Nurse visit.    This visit occurred during the  SARS-CoV-2 public health emergency.  Safety protocols were in place, including screening questions prior to the visit, additional usage of staff PPE, and extensive cleaning of exam room while observing appropriate contact time as indicated for disinfecting solutions.

## 2021-11-07 NOTE — Assessment & Plan Note (Signed)
Stable with current inhalers.  Recommend continuation.  He has been doing well on smoking cessation.

## 2021-11-21 ENCOUNTER — Ambulatory Visit (INDEPENDENT_AMBULATORY_CARE_PROVIDER_SITE_OTHER): Payer: Medicare Other | Admitting: Medical-Surgical

## 2021-11-21 VITALS — BP 139/75 | HR 90 | Ht 65.0 in | Wt 138.0 lb

## 2021-11-21 DIAGNOSIS — R252 Cramp and spasm: Secondary | ICD-10-CM | POA: Diagnosis not present

## 2021-11-21 NOTE — Progress Notes (Signed)
Agree with documentation as below.  ___________________________________________ Wren Pryce L. Mouhamadou Gittleman, DNP, APRN, FNP-BC Primary Care and Sports Medicine [facility]  

## 2021-11-21 NOTE — Progress Notes (Signed)
   Subjective:    Patient ID: Shane Carrillo, male    DOB: 10-15-1945, 76 y.o.   MRN: [id number]  HPI Pt is here for BP check. Bp 139/75 P90, re-check BP 131/77 P 84     Review of Systems     Objective:   Physical Exam        Assessment & Plan:  Pt is having leg cramps and headaches, BMP ordered, pt advised to follow up in 3 months for hypertension or sooner pending lab results or if symptoms don't improve.

## 2021-11-22 LAB — BASIC METABOLIC PANEL WITH GFR
BUN: 20 mg/dL (ref 7–25)
CO2: 25 mmol/L (ref 20–32)
Calcium: 9.3 mg/dL (ref 8.6–10.3)
Chloride: 106 mmol/L (ref 98–110)
Creat: 1.14 mg/dL (ref 0.70–1.28)
Glucose, Bld: 87 mg/dL (ref 65–99)
Potassium: 4.4 mmol/L (ref 3.5–5.3)
Sodium: 138 mmol/L (ref 135–146)
eGFR: 67 mL/min/{1.73_m2} (ref >=60)

## 2021-11-28 ENCOUNTER — Ambulatory Visit (HOSPITAL_COMMUNITY)
Admission: RE | Admit: 2021-11-28 | Payer: Medicare Other | Source: Ambulatory Visit | Attending: Cardiovascular Disease | Admitting: Cardiovascular Disease

## 2021-12-10 ENCOUNTER — Ambulatory Visit (HOSPITAL_COMMUNITY): Payer: Medicare Other

## 2021-12-24 ENCOUNTER — Ambulatory Visit (HOSPITAL_COMMUNITY)
Admission: RE | Admit: 2021-12-24 | Discharge: 2021-12-24 | Disposition: A | Discharge status: Home / Self Care | Payer: Medicare Other | Source: Ambulatory Visit | Attending: Cardiovascular Disease | Admitting: Cardiovascular Disease

## 2021-12-24 DIAGNOSIS — I739 Peripheral vascular disease, unspecified: Secondary | ICD-10-CM | POA: Diagnosis present

## 2021-12-26 ENCOUNTER — Encounter: Payer: Self-pay | Admitting: Family Medicine

## 2021-12-26 ENCOUNTER — Ambulatory Visit (INDEPENDENT_AMBULATORY_CARE_PROVIDER_SITE_OTHER): Payer: Medicare Other

## 2021-12-26 ENCOUNTER — Ambulatory Visit (INDEPENDENT_AMBULATORY_CARE_PROVIDER_SITE_OTHER): Payer: Medicare Other | Admitting: Family Medicine

## 2021-12-26 VITALS — BP 163/106 | HR 85 | Temp 98.2°F | Ht 65.0 in | Wt 145.1 lb

## 2021-12-26 DIAGNOSIS — M7989 Other specified soft tissue disorders: Secondary | ICD-10-CM

## 2021-12-26 DIAGNOSIS — R059 Cough, unspecified: Secondary | ICD-10-CM | POA: Diagnosis not present

## 2021-12-26 DIAGNOSIS — R6889 Other general symptoms and signs: Secondary | ICD-10-CM | POA: Insufficient documentation

## 2021-12-26 DIAGNOSIS — R030 Elevated blood-pressure reading, without diagnosis of hypertension: Secondary | ICD-10-CM

## 2021-12-26 DIAGNOSIS — R0602 Shortness of breath: Secondary | ICD-10-CM

## 2021-12-26 HISTORY — DX: Elevated blood-pressure reading, without diagnosis of hypertension: R03.0

## 2021-12-26 HISTORY — DX: Other specified soft tissue disorders: M79.89

## 2021-12-26 LAB — POCT INFLUENZA A/B
Influenza A, POC: NEGATIVE
Influenza B, POC: NEGATIVE

## 2021-12-26 LAB — POC COVID19 BINAXNOW: SARS Coronavirus 2 Ag: NEGATIVE

## 2021-12-26 MED ORDER — DOXYCYCLINE HYCLATE 100 MG PO TABS: 100.0000 mg | ORAL_TABLET | Freq: Two times a day (BID) | ORAL | 0 refills | Status: AC

## 2021-12-26 NOTE — Assessment & Plan Note (Signed)
-   Flu: negative A and B - Covid interpreted by me as negative

## 2021-12-26 NOTE — Progress Notes (Signed)
Acute Office Visit  Subjective:     Patient ID: LENIX KIDD, male    DOB: 1945-08-16, 76 y.o.   MRN: [id number]  Chief Complaint  Patient presents with   URI    HPI Patient is in today for congestion as well as lower extremity pain. BP elevated.   Review of Systems  Constitutional:  Negative for chills and fever.  Respiratory:  Positive for cough. Negative for shortness of breath.   Cardiovascular:  Negative for chest pain.  Neurological:  Negative for headaches.        Objective:    BP (!) 163/106   Pulse 85   Temp 98.2 F (36.8 C) (Temporal)   Ht '5\' 5"'$  (1.651 m)   Wt 145 lb 1.3 oz (65.8 kg)   SpO2 95%   BMI 24.14 kg/m    Physical Exam Vitals and nursing note reviewed.  Constitutional:      General: He is not in acute distress.    Appearance: Normal appearance.  HENT:     Head: Normocephalic and atraumatic.     Right Ear: External ear normal.     Left Ear: External ear normal.     Nose: Nose normal.  Eyes:     Conjunctiva/sclera: Conjunctivae normal.  Cardiovascular:     Rate and Rhythm: Normal rate and regular rhythm.  Pulmonary:     Effort: Pulmonary effort is normal.     Breath sounds: Normal breath sounds.     Comments: Coarse breath sounds throughout all lung fields. Neurological:     General: No focal deficit present.     Mental Status: He is alert and oriented to person, place, and time.  Psychiatric:        Mood and Affect: Mood normal.        Behavior: Behavior normal.        Thought Content: Thought content normal.        Judgment: Judgment normal.     Results for orders placed or performed in visit on 12/26/21  POC COVID-19  Result Value Ref Range   SARS Coronavirus 2 Ag Negative Negative  POCT Influenza A/B  Result Value Ref Range   Influenza A, POC Negative Negative   Influenza B, POC Negative Negative      Assessment & Plan:   Problem List Items Addressed This Visit       Other   Shortness of breath    - pt  presents with shortness of breath and coarse breath sounds on exam. Will go ahead and treat with doxycycline for possible COPD exacerbation.  - CXR ordered to check for pna. Per my interpretation, CXR doesn't show signs of pna      Relevant Medications   doxycycline (VIBRA-TABS) 100 MG tablet   Other Relevant Orders   DG Chest 2 View   Swelling of lower leg - Primary    - R leg pain with tenderness to palpation of left lower calf. R calf tenderness. - Ordered Stat doppler US  - doppler US: negative for blood clots      Relevant Orders   US Venous Img Lower Bilateral (DVT) (Completed)   Flu-like symptoms    - Flu: negative A and B - Covid interpreted by me as negative       Relevant Orders   POC COVID-19 (Completed)   POCT Influenza A/B (Completed)   Elevated blood pressure reading    - BP elevated, hard to understand if patient is taking  his medication appropriately  - due to symptoms of possible dvt and it being the end of the day, pt was asked to go down to [place] STAT  - asked him to follow up in a few days for BP  - recommended he not take any medication for cold that could raise blood pressure.        Meds ordered this encounter  Medications   doxycycline (VIBRA-TABS) 100 MG tablet    Sig: Take 1 tablet (100 mg total) by mouth 2 (two) times daily for 7 days.    Dispense:  14 tablet    Refill:  0    Return for follow up in one week with PCP.  Owens Loffler, DO

## 2021-12-26 NOTE — Assessment & Plan Note (Signed)
-   BP elevated, hard to understand if patient is taking his medication appropriately  - due to symptoms of possible dvt and it being the end of the day, pt was asked to go down to [place] STAT  - asked him to follow up in a few days for BP  - recommended he not take any medication for cold that could raise blood pressure.

## 2021-12-26 NOTE — Assessment & Plan Note (Addendum)
-   R leg pain with tenderness to palpation of left lower calf. R calf tenderness. - Ordered Stat doppler US  - doppler US: negative for blood clots

## 2021-12-26 NOTE — Assessment & Plan Note (Addendum)
-   pt presents with shortness of breath and coarse breath sounds on exam. Will go ahead and treat with doxycycline for possible COPD exacerbation.  - CXR ordered to check for pna. Per my interpretation, CXR doesn't show signs of pna

## 2021-12-31 ENCOUNTER — Ambulatory Visit: Payer: Medicare Other | Admitting: Family Medicine

## 2021-12-31 ENCOUNTER — Encounter: Payer: Self-pay | Admitting: Family Medicine

## 2021-12-31 VITALS — BP 162/84 | HR 63 | Ht 65.0 in | Wt 146.0 lb

## 2021-12-31 DIAGNOSIS — I7121 Aneurysm of the ascending aorta, without rupture: Secondary | ICD-10-CM

## 2021-12-31 DIAGNOSIS — I712 Thoracic aortic aneurysm, without rupture, unspecified: Secondary | ICD-10-CM

## 2021-12-31 DIAGNOSIS — J189 Pneumonia, unspecified organism: Secondary | ICD-10-CM

## 2021-12-31 DIAGNOSIS — I10 Essential (primary) hypertension: Secondary | ICD-10-CM

## 2021-12-31 HISTORY — DX: Thoracic aortic aneurysm, without rupture, unspecified: I71.20

## 2021-12-31 MED ORDER — AMOXICILLIN-POT CLAVULANATE 875-125 MG PO TABS: 1.0000 | ORAL_TABLET | Freq: Two times a day (BID) | ORAL | 0 refills | Status: DC

## 2021-12-31 MED ORDER — LOSARTAN POTASSIUM 100 MG PO TABS: 100.0000 mg | ORAL_TABLET | Freq: Every day | ORAL | 1 refills | Status: AC

## 2021-12-31 NOTE — Assessment & Plan Note (Signed)
Referral to cardiothoracic surgery at [facility]

## 2021-12-31 NOTE — Progress Notes (Signed)
DAVONTA STROOT - 76 y.o. male MRN [id number]  Date of birth: 24-Aug-1945  Subjective Chief Complaint  Patient presents with   Pneumonia    HPI Rodrigus is a 76 year old male here today for follow-up visit.  Seen by Dr. Mel Almond for chest congestion and lower extremity pain.  Treated for COPD exacerbation with doxycycline.  Chest x-ray ordered.  Radiologist read this as pneumonia.  Reports that he has had some improvement of his symptoms with doxycycline but continues to have some increased secretions.  Denies chest pain, fever or chills.  Ruled out for DVT with negative lower extremity Doppler.  Blood pressure noted to be elevated as well.  This has improved last visit however remains elevated.  He is taking losartan.  Referral back to cardiothoracic surgery at [facility] to continue following his thoracic aneurysm.  ROS:  A comprehensive ROS was completed and negative except as noted per HPI   Allergies  Allergen Reactions   Atorvastatin Itching   Sildenafil Palpitations   Simvastatin Itching   Aspirin Other (See Comments)    GI upset - Pt states that he is able to take the coated form     Past Medical History:  Diagnosis Date   Aortic ectasia, abdominal ([place]) 08/2016   On medicare screening u/s: Abdominal aortic atherosclerosis and mild ectasia. Maximal diameter 2.8 cm-->repeat u/s 5 yrs.   BPH with obstruction/lower urinary tract symptoms    Chronic cough    upper airway cough syndrome/irritable larynx syndrome->Dr. Melvyn Novas.   COPD (chronic obstructive pulmonary disease) (HCC)    Coronary artery disease    Stent.     DOE (dyspnea on exertion)    spirometry 02/2018 normal, but needs full PFT's per Dr. Melvyn Novas 03/2018.  No bronchodilators indicated as of 03/2018.   GERD (gastroesophageal reflux disease)    Dysphagia   Hypercholesterolemia    Intol of atorva and simva   Hypertension    PAD (peripheral artery disease) (HCC)    Abd aortic athero   Tobacco dependence    Unintentional  weight loss 2019   +Dysphagia,dec appetite/abd pain->pt was set to get EGD/colonoscopy late 2019 but he never followed up with GI to get this.    Past Surgical History:  Procedure Laterality Date   ABDOMINAL AORTOGRAM W/LOWER EXTREMITY N/A 02/24/2019   Procedure: ABDOMINAL AORTOGRAM W/LOWER EXTREMITY;  Surgeon: Lorretta Harp, MD;  Location: [facility];  Service: Cardiovascular;  Laterality: N/A;   CORONARY ANGIOPLASTY WITH STENT PLACEMENT     Dr. Ouida Sills in [place], [place].   FOOT SURGERY     PERIPHERAL VASCULAR INTERVENTION Left 02/24/2019   Procedure: PERIPHERAL VASCULAR INTERVENTION;  Surgeon: Lorretta Harp, MD;  Location: [facility];  Service: Cardiovascular;  Laterality: Left;   ROTATOR CUFF REPAIR      Social History   Socioeconomic History   Marital status: Divorced    Spouse name: Not on file   Number of children: Not on file   Years of education: 12th grade   Highest education level: Not on file  Occupational History    Comment: Retired  Tobacco Use   Smoking status: Former    Packs/day: 1.75    Years: 59.00    Total pack years: 103.25    Types: Cigarettes    Quit date: 07/18/2020    Years since quitting: 1.4   Smokeless tobacco: Never  Vaping Use   Vaping Use: Never used  Substance and Sexual Activity   Alcohol use: Not Currently  Drug use: No   Sexual activity: Not on file  Other Topics Concern   Not on file  Social History Narrative   Lives alone. He has 12 grown children. He enjoys [place] race track and the [place].   Social Determinants of Health   Financial Resource Strain: Medium Risk (08/21/2020)   Overall Financial Resource Strain (CARDIA)    Difficulty of Paying Living Expenses: Somewhat hard  Food Insecurity: Food Insecurity Present (10/03/2020)   Hunger Vital Sign    Worried About Running Out of Food in the Last Year: Sometimes true    Ran Out of Food in the Last Year: Sometimes true  Transportation Needs: No  Transportation Needs (10/03/2020)   PRAPARE - Hydrologist (Medical): No    Lack of Transportation (Non-Medical): No  Physical Activity: Insufficiently Active (08/21/2020)   Exercise Vital Sign    Days of Exercise per Week: 3 days    Minutes of Exercise per Session: 30 min  Stress: No Stress Concern Present (08/21/2020)   [place]    Feeling of Stress : Only a little  Social Connections: Socially Isolated (08/21/2020)   Social Connection and Isolation Panel [NHANES]    Frequency of Communication with Friends and Family: Three times a week    Frequency of Social Gatherings with Friends and Family: Once a week    Attends Religious Services: Never    Marine scientist or Organizations: No    Attends Music therapist: Never    Marital Status: Divorced    Family History  Problem Relation Age of Onset   Cancer - Cervical Mother    Colon cancer Mother    Colon cancer Father    Prostate cancer Father    Diabetes Maternal Grandmother     Health Maintenance  Topic Date Due   Medicare Annual Wellness (AWV)  08/21/2021   COVID-19 Vaccine (1) 03/20/2022 (Originally 01/06/1951)   Zoster Vaccines- Shingrix (1 of 2) 03/20/2022 (Originally 01/05/1966)   Pneumonia Vaccine 102+ Years old (2 - PPSV23 or PCV20) 06/13/2022 (Originally 02/27/2017)   TETANUS/TDAP  07/30/2022 (Originally 01/07/2020)   Fecal DNA (Cologuard)  07/30/2022 (Originally 03/21/2019)   Hepatitis C Screening  11/08/2022 (Originally 01/05/1965)   Lung Cancer Screening  09/13/2022   HPV VACCINES  Aged Out   INFLUENZA VACCINE  Discontinued     ----------------------------------------------------------------------------------------------------------------------------------------------------------------------------------------------------------------- Physical Exam BP (!) 162/84 (BP Location: Right Arm, Patient Position:  Sitting, Cuff Size: Normal)   Pulse 63   Ht '5\' 5"'$  (1.651 m)   Wt 146 lb (66.2 kg)   SpO2 100%   BMI 24.30 kg/m   Physical Exam Constitutional:      Appearance: Normal appearance.  HENT:     Head: Normocephalic and atraumatic.  Eyes:     General: No scleral icterus. Cardiovascular:     Rate and Rhythm: Normal rate and regular rhythm.  Pulmonary:     Effort: Pulmonary effort is normal.     Breath sounds: Normal breath sounds.  Neurological:     Mental Status: He is alert.  Psychiatric:        Mood and Affect: Mood normal.        Behavior: Behavior normal.     ------------------------------------------------------------------------------------------------------------------------------------------------------------------------------------------------------------------- Assessment and Plan  Essential hypertension Blood pressure remains elevated today.  Increasing losartan to 100 mg daily.  Updated prescription sent in.  Community acquired pneumonia Currently on doxycycline.  We will  add Augmentin for additional coverage to be sure this fully resolves.  He is using his finger in his trach to help with speech instead of his PMV.  We discussed that he should avoid doing this due to the potential for introducing bacteria and/or other viruses into the airway.Return in about 6 weeks (around 02/11/2022) for F/u Pneumonia.   Thoracic aortic aneurysm [facility]) Referral to cardiothoracic surgery at [facility].   Meds ordered this encounter  Medications   amoxicillin-clavulanate (AUGMENTIN) 875-125 MG tablet    Sig: Take 1 tablet by mouth 2 (two) times daily.    Dispense:  20 tablet    Refill:  0   losartan (COZAAR) 100 MG tablet    Sig: Take 1 tablet (100 mg total) by mouth daily.    Dispense:  90 tablet    Refill:  1    Return in about 6 weeks (around 02/11/2022) for F/u Pneumonia.    This visit occurred during the SARS-CoV-2 public health emergency.  Safety protocols were in place,  including screening questions prior to the visit, additional usage of staff PPE, and extensive cleaning of exam room while observing appropriate contact time as indicated for disinfecting solutions.

## 2021-12-31 NOTE — Assessment & Plan Note (Signed)
Blood pressure remains elevated today.  Increasing losartan to 100 mg daily.  Updated prescription sent in.

## 2021-12-31 NOTE — Patient Instructions (Addendum)
Complete course of doxycycline.  Add augmentin on as well.  Continue current breathing treatments Let me know if symptoms worsen.  See me again in 6 weeks.

## 2021-12-31 NOTE — Assessment & Plan Note (Signed)
Currently on doxycycline.  We will add Augmentin for additional coverage to be sure this fully resolves.  He is using his finger in his trach to help with speech instead of his PMV.  We discussed that he should avoid doing this due to the potential for introducing bacteria and/or other viruses into the airway.Return in about 6 weeks (around 02/11/2022) for F/u Pneumonia.

## 2022-01-23 ENCOUNTER — Other Ambulatory Visit: Payer: Self-pay | Admitting: Family Medicine

## 2022-01-23 DIAGNOSIS — I739 Peripheral vascular disease, unspecified: Secondary | ICD-10-CM

## 2022-02-07 ENCOUNTER — Encounter: Payer: Medicare Other | Admitting: Vascular Surgery

## 2022-03-12 ENCOUNTER — Encounter: Payer: Self-pay | Admitting: Family Medicine

## 2022-03-12 ENCOUNTER — Ambulatory Visit (INDEPENDENT_AMBULATORY_CARE_PROVIDER_SITE_OTHER): Payer: Medicare Other | Admitting: Family Medicine

## 2022-03-12 ENCOUNTER — Ambulatory Visit (INDEPENDENT_AMBULATORY_CARE_PROVIDER_SITE_OTHER): Payer: Medicare Other

## 2022-03-12 VITALS — BP 141/78 | HR 70 | Ht 65.0 in | Wt 143.0 lb

## 2022-03-12 DIAGNOSIS — J441 Chronic obstructive pulmonary disease with (acute) exacerbation: Secondary | ICD-10-CM

## 2022-03-12 DIAGNOSIS — M545 Low back pain, unspecified: Secondary | ICD-10-CM | POA: Diagnosis not present

## 2022-03-12 DIAGNOSIS — R051 Acute cough: Secondary | ICD-10-CM

## 2022-03-12 LAB — COMPLETE METABOLIC PANEL WITH GFR
AG Ratio: 1.5 (calc) (ref 1.0–2.5)
ALT: 35 U/L (ref 9–46)
AST: 39 U/L — ABNORMAL HIGH (ref 10–35)
Albumin: 4.3 g/dL (ref 3.6–5.1)
Alkaline phosphatase (APISO): 82 U/L (ref 35–144)
BUN: 17 mg/dL (ref 7–25)
CO2: 29 mmol/L (ref 20–32)
Calcium: 9.5 mg/dL (ref 8.6–10.3)
Chloride: 105 mmol/L (ref 98–110)
Creat: 1.2 mg/dL (ref 0.70–1.28)
Globulin: 2.8 g/dL (calc) (ref 1.9–3.7)
Glucose, Bld: 92 mg/dL (ref 65–99)
Potassium: 5 mmol/L (ref 3.5–5.3)
Sodium: 141 mmol/L (ref 135–146)
Total Bilirubin: 0.6 mg/dL (ref 0.2–1.2)
Total Protein: 7.1 g/dL (ref 6.1–8.1)
eGFR: 63 mL/min/{1.73_m2} (ref >=60)

## 2022-03-12 LAB — URINALYSIS, ROUTINE W REFLEX MICROSCOPIC
Bacteria, UA: NONE SEEN /HPF
Bilirubin Urine: NEGATIVE
Glucose, UA: NEGATIVE
Hgb urine dipstick: NEGATIVE
Leukocytes,Ua: NEGATIVE
Nitrite: NEGATIVE
RBC / HPF: NONE SEEN /HPF (ref 0–2)
Specific Gravity, Urine: 1.028 (ref 1.001–1.035)
WBC, UA: NONE SEEN /HPF (ref 0–5)
pH: 5.5 (ref 5.0–8.0)

## 2022-03-12 LAB — CBC WITH DIFFERENTIAL/PLATELET
Absolute Monocytes: 962 cells/uL — ABNORMAL HIGH (ref 200–950)
Basophils Absolute: 41 cells/uL (ref 0–200)
Basophils Relative: 1.1 %
Eosinophils Absolute: 163 cells/uL (ref 15–500)
Eosinophils Relative: 4.4 %
HCT: 45.9 % (ref 38.5–50.0)
Hemoglobin: 14.7 g/dL (ref 13.2–17.1)
Lymphs Abs: 1647 cells/uL (ref 850–3900)
MCH: 25.8 pg — ABNORMAL LOW (ref 27.0–33.0)
MCHC: 32 g/dL (ref 32.0–36.0)
MCV: 80.5 fL (ref 80.0–100.0)
MPV: 9.2 fL (ref 7.5–12.5)
Monocytes Relative: 26 %
Neutro Abs: 888 cells/uL — ABNORMAL LOW (ref 1500–7800)
Neutrophils Relative %: 24 %
Platelets: 436 10*3/uL — ABNORMAL HIGH (ref 140–400)
RBC: 5.7 10*6/uL (ref 4.20–5.80)
RDW: 13.2 % (ref 11.0–15.0)
Total Lymphocyte: 44.5 %
WBC: 3.7 10*3/uL — ABNORMAL LOW (ref 3.8–10.8)

## 2022-03-12 LAB — MICROSCOPIC MESSAGE

## 2022-03-12 MED ORDER — ALBUTEROL SULFATE HFA 108 (90 BASE) MCG/ACT IN AERS: 2.0000 | INHALATION_SPRAY | Freq: Four times a day (QID) | RESPIRATORY_TRACT | 3 refills | Status: DC | PRN

## 2022-03-12 MED ORDER — DOXYCYCLINE HYCLATE 100 MG PO TABS: 100.0000 mg | ORAL_TABLET | Freq: Two times a day (BID) | ORAL | 0 refills | Status: DC

## 2022-03-12 MED ORDER — KETOROLAC TROMETHAMINE 60 MG/2ML IM SOLN
60.0000 mg | Freq: Once | INTRAMUSCULAR | Status: AC
  Administered 2022-03-12: 60 mg via INTRAMUSCULAR

## 2022-03-12 NOTE — Progress Notes (Addendum)
Established Patient Office Visit  Subjective   Patient ID: Shane Carrillo, male    DOB: [birth year]  Age: 77 y.o. MRN: [id number]  Chief Complaint  Patient presents with   Back Pain   blood in sputum    Pt has a trach and he reports that he hasn't been able to suction all of the sputum and blood     HPI  Patient has a history of laryngectomy and follows with [facility]. He called yesterday and said on Friday he started getting some blood with coughing with trach.    He has had a hard time suctioning the extra mucus that he has been getting.  Denies any sore throat headache fever.  No GI symptoms.  Back pain started about 7 days ago. No injury or trauma. Bilateral.  Pain is 8/10.  No ice or heat or other things tried for pain.  No urinary sxs. No blood in urine.  He did feel like he had some sweats Saturday Sunday night not as much last night.  He did not check his temperature.    ROS    Objective:     BP (!) 141/78   Pulse 70   Ht '5\' 5"'$  (1.651 m)   Wt 143 lb (64.9 kg)   SpO2 95%   BMI 23.80 kg/m    Physical Exam Constitutional:      Appearance: He is well-developed.  HENT:     Head: Normocephalic and atraumatic.     Right Ear: Tympanic membrane, ear canal and external ear normal.     Left Ear: Tympanic membrane, ear canal and external ear normal.     Nose: Nose normal.     Mouth/Throat:     Pharynx: Oropharynx is clear.  Eyes:     Conjunctiva/sclera: Conjunctivae normal.     Pupils: Pupils are equal, round, and reactive to light.  Neck:     Thyroid: No thyromegaly.     Comments: Trach in place.  Demonstrated clear musous with streaks of blood and small particles of green mucous.  Cardiovascular:     Rate and Rhythm: Normal rate and regular rhythm.     Heart sounds: Normal heart sounds.  Pulmonary:     Effort: Pulmonary effort is normal.     Breath sounds: Rhonchi present.  Abdominal:     General: Bowel sounds are normal.     Palpations: Abdomen is  soft.  Musculoskeletal:     Cervical back: Neck supple.     Comments: Tender over the lumbar and thoracic spine.  Nontender over the paraspinous muscles.  CVA tenderness on the right side.  Lymphadenopathy:     Cervical: No cervical adenopathy.  Skin:    General: Skin is warm and dry.  Neurological:     Mental Status: He is alert and oriented to person, place, and time.  Psychiatric:        Behavior: Behavior normal.      Results for orders placed or performed in visit on 03/12/22  CBC with Differential/Platelet  Result Value Ref Range   WBC 3.7 (L) 3.8 - 10.8 Thousand/uL   RBC 5.70 4.20 - 5.80 Million/uL   Hemoglobin 14.7 13.2 - 17.1 g/dL   HCT 45.9 38.5 - 50.0 %   MCV 80.5 80.0 - 100.0 fL   MCH 25.8 (L) 27.0 - 33.0 pg   MCHC 32.0 32.0 - 36.0 g/dL   RDW 13.2 11.0 - 15.0 %   Platelets 436 (H) 140 -  400 Thousand/uL   MPV 9.2 7.5 - 12.5 fL   Neutro Abs 888 (L) 1,500 - 7,800 cells/uL   Lymphs Abs 1,647 850 - 3,900 cells/uL   Absolute Monocytes 962 (H) 200 - 950 cells/uL   Eosinophils Absolute 163 15 - 500 cells/uL   Basophils Absolute 41 0 - 200 cells/uL   Neutrophils Relative % 24 %   Total Lymphocyte 44.5 %   Monocytes Relative 26.0 %   Eosinophils Relative 4.4 %   Basophils Relative 1.1 %   Smear Review    COMPLETE METABOLIC PANEL WITH GFR  Result Value Ref Range   Glucose, Bld 92 65 - 99 mg/dL   BUN 17 7 - 25 mg/dL   Creat 1.20 0.70 - 1.28 mg/dL   eGFR 63 > OR = 60 mL/min/1.61m   BUN/Creatinine Ratio SEE NOTE: 6 - 22 (calc)   Sodium 141 135 - 146 mmol/L   Potassium 5.0 3.5 - 5.3 mmol/L   Chloride 105 98 - 110 mmol/L   CO2 29 20 - 32 mmol/L   Calcium 9.5 8.6 - 10.3 mg/dL   Total Protein 7.1 6.1 - 8.1 g/dL   Albumin 4.3 3.6 - 5.1 g/dL   Globulin 2.8 1.9 - 3.7 g/dL (calc)   AG Ratio 1.5 1.0 - 2.5 (calc)   Total Bilirubin 0.6 0.2 - 1.2 mg/dL   Alkaline phosphatase (APISO) 82 35 - 144 U/L   AST 39 (H) 10 - 35 U/L   ALT 35 9 - 46 U/L      The ASCVD Risk  score (Arnett DK, et al., 2019) failed to calculate for the following reasons:   The patient has a prior MI or stroke diagnosis    Assessment & Plan:   Problem List Items Addressed This Visit   None Visit Diagnoses     Acute cough    -  Primary   Relevant Orders   DG Chest 2 View (Completed)   CBC with Differential/Platelet (Completed)   COMPLETE METABOLIC PANEL WITH GFR (Completed)   Urinalysis, Routine w reflex microscopic   Acute bilateral low back pain without sciatica       Relevant Medications   ketorolac (TORADOL) injection 60 mg (Completed)   Other Relevant Orders   CBC with Differential/Platelet (Completed)   COMPLETE METABOLIC PANEL WITH GFR (Completed)   Urinalysis, Routine w reflex microscopic   COPD with exacerbation (HCC)       Relevant Medications   albuterol (PROAIR HFA) 108 (90 Base) MCG/ACT inhaler      Acute cough-diffuse rhonchi in the chest, most consistent with COPD exacerbation. .  Will get chest x-ray to evaluate for possible pneumonia since he is also having back pain with it though he is afebrile.  Sounds like he may have had some sweats at night over the last few nights.  If negative then we will still consider treatment with antibiotics since he is having some blood with mucus. Use albuterol every 6 hours as needed for cough and congestion.   Back pain-nontender with palpation but he does have some right CVA tenderness-we will get labs to evaluate renal function as well as check white blood cell count.  He denies any GI symptoms.  No dysuria.  Will call with results once available.  Given IM Toradol for acute pain relief.  Declined flu and COVID testing.      Return if symptoms worsen or fail to improve with Dr. MZigmund Daniel    CBeatrice Lecher MD

## 2022-03-12 NOTE — Progress Notes (Signed)
Please call Shane Carrillo and let him know I also send over albuterol to use to help open his chest up.  Please use every 6 hours for next severval days. His White blood cells that help fight infection are low. I would like to recheck that in 1 week.

## 2022-03-12 NOTE — Addendum Note (Signed)
Addended by: Beatrice Lecher D on: 03/12/2022 04:12 PM   Modules accepted: Orders

## 2022-03-12 NOTE — Progress Notes (Signed)
Please call patient and let him know that the chest x-ray looked okay.  No sign of pneumonia.  I am going to go ahead and send over an antibiotic for him to take to cover for bronchitis.  Hopefully the labs will be back this afternoon and we can have a little bit more information.

## 2022-03-13 ENCOUNTER — Other Ambulatory Visit: Payer: Self-pay | Admitting: *Deleted

## 2022-03-13 DIAGNOSIS — D72819 Decreased white blood cell count, unspecified: Secondary | ICD-10-CM

## 2022-03-14 NOTE — Progress Notes (Signed)
Call patient: Please let him know that one of the liver enzymes is just a little borderline as well.  That may just be from being sick right now also when we recheck his white blood cell count in a month I would also like to recheck hepatic function.

## 2022-03-18 ENCOUNTER — Other Ambulatory Visit: Payer: Self-pay | Admitting: *Deleted

## 2022-03-24 LAB — CBC
HCT: 43.7 % (ref 38.5–50.0)
Hemoglobin: 14.6 g/dL (ref 13.2–17.1)
MCH: 26.8 pg — ABNORMAL LOW (ref 27.0–33.0)
MCHC: 33.4 g/dL (ref 32.0–36.0)
MCV: 80.3 fL (ref 80.0–100.0)
MPV: 9.1 fL (ref 7.5–12.5)
Platelets: 376 10*3/uL (ref 140–400)
RBC: 5.44 10*6/uL (ref 4.20–5.80)
RDW: 13.2 % (ref 11.0–15.0)
WBC: 3.6 10*3/uL — ABNORMAL LOW (ref 3.8–10.8)

## 2022-03-25 NOTE — Progress Notes (Signed)
Call pt: WBC count is still the same.  No change.  Recommend recheck in 1 month as previously stated I really want to give his body time to recover and see if it goes back to baseline but at least it stable and it is not trending downward.

## 2022-03-27 ENCOUNTER — Telehealth: Payer: Self-pay

## 2022-03-27 NOTE — Telephone Encounter (Signed)
Please see the lab results from 2/5. Thanks!

## 2022-03-27 NOTE — Telephone Encounter (Signed)
Shane Carrillo lvm requesting result information. Please advise. Thanks

## 2022-03-28 ENCOUNTER — Other Ambulatory Visit: Payer: Self-pay

## 2022-03-28 DIAGNOSIS — D72819 Decreased white blood cell count, unspecified: Secondary | ICD-10-CM

## 2022-03-28 NOTE — Progress Notes (Signed)
Pt was advised of lab results. Recheck CBC for WBC in 1 month.

## 2022-03-31 NOTE — Telephone Encounter (Signed)
Note  found in patient chart. Patient advised by Cleo.

## 2022-04-21 ENCOUNTER — Ambulatory Visit: Payer: Medicare Other

## 2022-05-30 ENCOUNTER — Telehealth: Payer: Self-pay | Admitting: Family Medicine

## 2022-05-30 NOTE — Telephone Encounter (Signed)
Contacted Shane Carrillo to schedule their annual wellness visit. Patient declined to schedule AWV at this time.  [name] Patient Engineer, production II Direct Dial: [phone]

## 2022-08-20 ENCOUNTER — Other Ambulatory Visit: Payer: Medicare Other

## 2022-08-20 ENCOUNTER — Ambulatory Visit (INDEPENDENT_AMBULATORY_CARE_PROVIDER_SITE_OTHER): Payer: Medicare Other | Admitting: Family Medicine

## 2022-08-20 ENCOUNTER — Encounter: Payer: Self-pay | Admitting: Family Medicine

## 2022-08-20 ENCOUNTER — Ambulatory Visit (INDEPENDENT_AMBULATORY_CARE_PROVIDER_SITE_OTHER): Payer: Medicare Other

## 2022-08-20 VITALS — BP 170/80 | HR 72 | Resp 18 | Ht 65.0 in | Wt 146.2 lb

## 2022-08-20 DIAGNOSIS — R222 Localized swelling, mass and lump, trunk: Secondary | ICD-10-CM

## 2022-08-20 DIAGNOSIS — F321 Major depressive disorder, single episode, moderate: Secondary | ICD-10-CM

## 2022-08-20 DIAGNOSIS — N649 Disorder of breast, unspecified: Secondary | ICD-10-CM

## 2022-08-20 HISTORY — DX: Disorder of breast, unspecified: N64.9

## 2022-08-20 HISTORY — DX: Localized swelling, mass and lump, trunk: R22.2

## 2022-08-20 HISTORY — DX: Major depressive disorder, single episode, moderate: F32.1

## 2022-08-20 MED ORDER — MIRTAZAPINE 7.5 MG PO TABS: 7.5000 mg | ORAL_TABLET | Freq: Every day | ORAL | 1 refills | Status: DC

## 2022-08-20 NOTE — Assessment & Plan Note (Signed)
[place] right breast ordered, on exam this looks like cyst. Getting ultrasound to confirm and then can discuss I&D

## 2022-08-20 NOTE — Patient Instructions (Signed)
2pm today for ultrasound downstairs of the back   They will call you about the breast ultrasound

## 2022-08-20 NOTE — Assessment & Plan Note (Signed)
-   pt very tearful on exam as he misses his girlfriend he is also frustrated with his trache - phq9 filled out but I do not feel it is accurate representation of how he is feeling.  - started on mirtazepine to help with sleep, appetite, mood

## 2022-08-20 NOTE — Progress Notes (Signed)
Acute Office Visit  Subjective:     Patient ID: Shane Carrillo, male    DOB: [birth year], 77 y.o.   MRN: [id number]  Chief Complaint  Patient presents with   Breast Mass    PT has a mass growing under right nipple x70months no pain    HPI Patient is in today for acute visit. Has noticed an abnormal mass on his right breast. Also noticed a mass on his back.   Admits to lack of appetite and being sad. Says he is depressed with his trach and also misses his girlfriend.  Review of Systems  Constitutional:  Negative for chills and fever.  Respiratory:  Negative for cough and shortness of breath.   Cardiovascular:  Negative for chest pain.  Neurological:  Negative for headaches.        Objective:    BP (!) 170/80 (BP Location: Left Arm, Patient Position: Sitting, Cuff Size: Normal)   Pulse 72   Resp 18   Ht 5\' 5"  (1.651 m)   Wt 146 lb 4 oz (66.3 kg)   SpO2 99%   BMI 24.34 kg/m    Physical Exam Vitals and nursing note reviewed.  Constitutional:      General: He is not in acute distress.    Appearance: Normal appearance.  HENT:     Head: Normocephalic and atraumatic.     Right Ear: External ear normal.     Left Ear: External ear normal.     Nose: Nose normal.  Eyes:     Conjunctiva/sclera: Conjunctivae normal.  Cardiovascular:     Rate and Rhythm: Normal rate and regular rhythm.  Pulmonary:     Effort: Pulmonary effort is normal.     Breath sounds: Normal breath sounds.  Musculoskeletal:     Comments: Mobile golf ball sized mass on right side of back under scapula. Circular in nature   Skin:    Comments: Small dime sized lesion on right breast. No erythema or redness  Neurological:     General: No focal deficit present.     Mental Status: He is alert and oriented to person, place, and time.  Psychiatric:        Mood and Affect: Mood normal.        Behavior: Behavior normal.        Thought Content: Thought content normal.        Judgment: Judgment normal.      No results found for any visits on 08/20/22.      Assessment & Plan:   Problem List Items Addressed This Visit       Other   Breast lesion - Primary    [place] right breast ordered, on exam this looks like cyst. Getting ultrasound to confirm and then can discuss I&D       Relevant Orders   [place] LIMITED ULTRASOUND INCLUDING AXILLA RIGHT BREAST   Depression, major, single episode, moderate (HCC)    - pt very tearful on exam as he misses his girlfriend he is also frustrated with his trache - phq9 filled out but I do not feel it is accurate representation of how he is feeling.  - started on mirtazepine to help with sleep, appetite, mood      Relevant Medications   mirtazapine (REMERON) 7.5 MG tablet   Other Relevant Orders   Ambulatory referral to Behavioral Health   Mass of skin of back   Relevant Orders   [place] CHEST SOFT TISSUE  Meds ordered this encounter  Medications   mirtazapine (REMERON) 7.5 MG tablet    Sig: Take 1 tablet (7.5 mg total) by mouth at bedtime.    Dispense:  30 tablet    Refill:  1    Return in about 2 weeks (around 09/03/2022) for with PCP.  Charlton Amor, DO

## 2022-08-22 ENCOUNTER — Other Ambulatory Visit: Payer: Self-pay | Admitting: Family Medicine

## 2022-08-22 DIAGNOSIS — N649 Disorder of breast, unspecified: Secondary | ICD-10-CM

## 2022-08-23 DIAGNOSIS — R55 Syncope and collapse: Secondary | ICD-10-CM | POA: Insufficient documentation

## 2022-08-23 DIAGNOSIS — E876 Hypokalemia: Secondary | ICD-10-CM | POA: Insufficient documentation

## 2022-08-23 HISTORY — DX: Syncope and collapse: R55

## 2022-08-25 ENCOUNTER — Telehealth: Payer: Self-pay

## 2022-08-25 NOTE — Transitions of Care (Post Inpatient/ED Visit) (Unsigned)
   08/25/2022  Name: Shane Carrillo MRN: [id number] DOB: 03-18-1945  Today's TOC FU Call Status: Today's TOC FU Call Status:: Unsuccessul Call (1st Attempt) Unsuccessful Call (1st Attempt) Date: 08/25/22  Attempted to reach the patient regarding the most recent Inpatient/ED visit.  Follow Up Plan: Additional outreach attempts will be made to reach the patient to complete the Transitions of Care (Post Inpatient/ED visit) call.   Signature Karena Addison, LPN [facility] Nurse Health Advisor Direct Dial [phone]

## 2022-08-26 NOTE — Transitions of Care (Post Inpatient/ED Visit) (Unsigned)
   08/26/2022  Name: Shane Carrillo MRN: [id number] DOB: May 30, 1945  Today's TOC FU Call Status: Today's TOC FU Call Status:: Unsuccessful Call (2nd Attempt) Unsuccessful Call (1st Attempt) Date: 08/25/22 Unsuccessful Call (2nd Attempt) Date: 08/26/22  Attempted to reach the patient regarding the most recent Inpatient/ED visit.  Follow Up Plan: Additional outreach attempts will be made to reach the patient to complete the Transitions of Care (Post Inpatient/ED visit) call.   Signature Karena Addison, LPN [facility] Nurse Health Advisor Direct Dial [phone]

## 2022-08-27 NOTE — Transitions of Care (Post Inpatient/ED Visit) (Signed)
   08/27/2022  Name: Shane Carrillo MRN: [id number] DOB: [birth year]  Today's TOC FU Call Status: Today's TOC FU Call Status:: Unsuccessful Call (3rd Attempt) Unsuccessful Call (1st Attempt) Date: 08/25/22 Unsuccessful Call (2nd Attempt) Date: 08/26/22 Unsuccessful Call (3rd Attempt) Date: 08/27/22  Attempted to reach the patient regarding the most recent Inpatient/ED visit.  Follow Up Plan: No further outreach attempts will be made at this time. We have been unable to contact the patient.  Signature Karena Addison, LPN [facility] Nurse Health Advisor Direct Dial [phone]

## 2022-09-01 ENCOUNTER — Encounter: Payer: Self-pay | Admitting: Family Medicine

## 2022-09-01 ENCOUNTER — Ambulatory Visit (INDEPENDENT_AMBULATORY_CARE_PROVIDER_SITE_OTHER): Payer: Medicare Other | Admitting: Family Medicine

## 2022-09-01 VITALS — BP 154/76 | HR 77 | Ht 65.0 in | Wt 146.2 lb

## 2022-09-01 DIAGNOSIS — R55 Syncope and collapse: Secondary | ICD-10-CM | POA: Insufficient documentation

## 2022-09-01 DIAGNOSIS — F4321 Adjustment disorder with depressed mood: Secondary | ICD-10-CM | POA: Diagnosis not present

## 2022-09-01 DIAGNOSIS — J410 Simple chronic bronchitis: Secondary | ICD-10-CM | POA: Diagnosis not present

## 2022-09-01 HISTORY — DX: Syncope and collapse: R55

## 2022-09-01 LAB — CBC WITH DIFFERENTIAL/PLATELET
Absolute Monocytes: 547 cells/uL (ref 200–950)
Eosinophils Absolute: 239 cells/uL (ref 15–500)
Lymphs Abs: 1849 cells/uL (ref 850–3900)
MCHC: 31.8 g/dL — ABNORMAL LOW (ref 32.0–36.0)
MCV: 81.8 fL (ref 80.0–100.0)
Neutro Abs: 1904 cells/uL (ref 1500–7800)
RDW: 13.4 % (ref 11.0–15.0)
Total Lymphocyte: 40.2 %
WBC: 4.6 10*3/uL (ref 3.8–10.8)

## 2022-09-01 MED ORDER — PREDNISONE 20 MG PO TABS: 20.0000 mg | ORAL_TABLET | Freq: Two times a day (BID) | ORAL | 0 refills | Status: AC

## 2022-09-01 MED ORDER — MIRTAZAPINE 15 MG PO TABS: 15.0000 mg | ORAL_TABLET | Freq: Every day | ORAL | 1 refills | Status: DC

## 2022-09-01 NOTE — Assessment & Plan Note (Signed)
Echocardiogram ordered.  Potassium level low on recent labs.  Rechecking labs today. Cardiology referral entered.

## 2022-09-01 NOTE — Assessment & Plan Note (Signed)
He is having some increased wheezing on exam.  Adding course of steroids.  Continue home nebulizer treatments.

## 2022-09-01 NOTE — Assessment & Plan Note (Signed)
Still some depressive symptoms and sleeping difficulty.  Increase mirtazapine to 15 mg.

## 2022-09-01 NOTE — Patient Instructions (Addendum)
Increase mirtazapine to 15mg  at bedtime.   I have ordered labs and echocardiogram  Be sure to hydrate well.   I have called in a steroid for your wheezing.

## 2022-09-01 NOTE — Progress Notes (Signed)
Shane Carrillo - 77 y.o. male MRN [id number]  Date of birth: 09-11-1945  Subjective Chief Complaint  Patient presents with   Hospitalization Follow-up    HPI Shane Carrillo is a 77 y.o. male here today for hospital follow up.   He had syncopal episode while at a restaurant.  EMS contacted and was diaphoretic.  He was admitted to telemetry.  TTE and cardiology consultation ordered however patient left AMA.  CT head and CXR normal.  He has not had any further symptoms since discharge from the hospital.  He is staying well-hydrated.  He is little more congested in his chest or noticeable wheezing.  He has not had no chest pain or palpitations.  He does have some intermittent dizziness.  ROS:  A comprehensive ROS was completed and negative except as noted per HPI   Allergies  Allergen Reactions   Atorvastatin Itching   Sildenafil Palpitations   Simvastatin Itching   Aspirin Other (See Comments)    GI upset - Pt states that he is able to take the coated form     Past Medical History:  Diagnosis Date   Aortic ectasia, abdominal (HCC) 08/2016   On medicare screening u/s: Abdominal aortic atherosclerosis and mild ectasia. Maximal diameter 2.8 cm-->repeat u/s 5 yrs.   BPH with obstruction/lower urinary tract symptoms    Chronic cough    upper airway cough syndrome/irritable larynx syndrome->Dr. Sherene Sires.   COPD (chronic obstructive pulmonary disease) (HCC)    Coronary artery disease    Stent.     DOE (dyspnea on exertion)    spirometry 02/2018 normal, but needs full PFT's per Dr. Sherene Sires 03/2018.  No bronchodilators indicated as of 03/2018.   GERD (gastroesophageal reflux disease)    Dysphagia   Hypercholesterolemia    Intol of atorva and simva   Hypertension    PAD (peripheral artery disease) (HCC)    Abd aortic athero   Tobacco dependence    Unintentional weight loss 2019   +Dysphagia,dec appetite/abd pain->pt was set to get EGD/colonoscopy late 2019 but he never followed up with GI  to get this.    Past Surgical History:  Procedure Laterality Date   ABDOMINAL AORTOGRAM W/LOWER EXTREMITY N/A 02/24/2019   Procedure: ABDOMINAL AORTOGRAM W/LOWER EXTREMITY;  Surgeon: Runell Gess, MD;  Location: [facility] INVASIVE CV LAB;  Service: Cardiovascular;  Laterality: N/A;   CORONARY ANGIOPLASTY WITH STENT PLACEMENT     Dr. Dareen Piano in [place], [place].   FOOT SURGERY     PERIPHERAL VASCULAR INTERVENTION Left 02/24/2019   Procedure: PERIPHERAL VASCULAR INTERVENTION;  Surgeon: Runell Gess, MD;  Location: [facility] INVASIVE CV LAB;  Service: Cardiovascular;  Laterality: Left;   ROTATOR CUFF REPAIR      Social History   Socioeconomic History   Marital status: Divorced    Spouse name: Not on file   Number of children: Not on file   Years of education: 12th grade   Highest education level: Not on file  Occupational History    Comment: Retired  Tobacco Use   Smoking status: Former    Current packs/day: 0.00    Average packs/day: 1.8 packs/day for 59.0 years (103.3 ttl pk-yrs)    Types: Cigarettes    Start date: 07/18/1961    Quit date: 07/18/2020    Years since quitting: 2.1   Smokeless tobacco: Never  Vaping Use   Vaping status: Never Used  Substance and Sexual Activity   Alcohol use: Not Currently   Drug use: No  Sexual activity: Not on file  Other Topics Concern   Not on file  Social History Narrative   Lives alone. He has 12 grown children. He enjoys going to the race track and the casino.   Social Determinants of Health   Financial Resource Strain: Medium Risk (08/21/2020)   Overall Financial Resource Strain (CARDIA)    Difficulty of Paying Living Expenses: Somewhat hard  Food Insecurity: No Food Insecurity (08/23/2022)   Received from [facility], [facility]   Hunger Vital Sign    Worried About Running Out of Food in the Last Year: Never true    Ran Out of Food in the Last Year: Never true  Transportation Needs: No Transportation Needs (08/23/2022)   Received from  [facility], [facility]   PRAPARE - Transportation    Lack of Transportation (Medical): No    Lack of Transportation (Non-Medical): No  Physical Activity: Insufficiently Active (08/21/2020)   Exercise Vital Sign    Days of Exercise per Week: 3 days    Minutes of Exercise per Session: 30 min  Stress: No Stress Concern Present (08/23/2022)   Received from [facility], [facility] - Occupational Stress Questionnaire    Feeling of Stress : Only a little  Social Connections: Unknown (06/17/2021)   Received from [facility], [facility]   Social Network    Social Network: Not on file    Family History  Problem Relation Age of Onset   Cancer - Cervical Mother    Colon cancer Mother    Colon cancer Father    Prostate cancer Father    Diabetes Maternal Grandmother     Health Maintenance  Topic Date Due   DTaP/Tdap/Td (3 - Td or Tdap) 01/07/2020   Lung Cancer Screening  09/13/2022   Hepatitis C Screening  11/08/2022 (Originally 01/05/1964)   COVID-19 Vaccine (1) 12/02/2022 (Originally 01/06/1951)   Medicare Annual Wellness (AWV)  12/02/2022 (Originally 08/21/2021)   Zoster Vaccines- Shingrix (1 of 2) 12/02/2022 (Originally 01/05/1966)   Pneumonia Vaccine 33+ Years old (2 of 2 - PPSV23 or PCV20) 09/01/2023 (Originally 02/27/2017)   Fecal DNA (Cologuard)  09/01/2023 (Originally 03/21/2019)   INFLUENZA VACCINE  09/18/2022   HPV VACCINES  Aged Out     ----------------------------------------------------------------------------------------------------------------------------------------------------------------------------------------------------------------- Physical Exam BP (!) 154/76 (BP Location: Left Arm, Patient Position: Sitting, Cuff Size: Normal)   Pulse 77   Ht 5\' 5"  (1.651 m)   Wt 146 lb 4 oz (66.3 kg)   SpO2 96%   BMI 24.34 kg/m   Physical Exam Constitutional:      Appearance: Normal appearance.  HENT:     Head:  Normocephalic and atraumatic.  Eyes:     General: No scleral icterus. Cardiovascular:     Rate and Rhythm: Normal rate and regular rhythm.  Pulmonary:     Effort: Pulmonary effort is normal.     Breath sounds: Wheezing present.  Neurological:     Mental Status: He is alert.  Psychiatric:        Mood and Affect: Mood normal.        Behavior: Behavior normal.     ------------------------------------------------------------------------------------------------------------------------------------------------------------------------------------------------------------------- Assessment and Plan  No problem-specific Assessment & Plan notes found for this encounter.   No orders of the defined types were placed in this encounter.   No follow-ups on file.    This visit occurred during the SARS-CoV-2 public health emergency.  Safety protocols were in place, including screening questions prior to  the visit, additional usage of staff PPE, and extensive cleaning of exam room while observing appropriate contact time as indicated for disinfecting solutions.

## 2022-09-02 ENCOUNTER — Other Ambulatory Visit: Payer: Medicare Other

## 2022-09-02 LAB — COMPLETE METABOLIC PANEL WITH GFR
AG Ratio: 1.8 (calc) (ref 1.0–2.5)
ALT: 17 U/L (ref 9–46)
AST: 13 U/L (ref 10–35)
Albumin: 4.2 g/dL (ref 3.6–5.1)
Alkaline phosphatase (APISO): 71 U/L (ref 35–144)
BUN: 15 mg/dL (ref 7–25)
CO2: 23 mmol/L (ref 20–32)
Calcium: 9.3 mg/dL (ref 8.6–10.3)
Chloride: 107 mmol/L (ref 98–110)
Creat: 1.11 mg/dL (ref 0.70–1.28)
Globulin: 2.4 g/dL (calc) (ref 1.9–3.7)
Glucose, Bld: 90 mg/dL (ref 65–99)
Potassium: 4.3 mmol/L (ref 3.5–5.3)
Sodium: 140 mmol/L (ref 135–146)
Total Bilirubin: 0.6 mg/dL (ref 0.2–1.2)
Total Protein: 6.6 g/dL (ref 6.1–8.1)
eGFR: 69 mL/min/{1.73_m2} (ref >=60)

## 2022-09-02 LAB — CBC WITH DIFFERENTIAL/PLATELET
Basophils Absolute: 60 cells/uL (ref 0–200)
Basophils Relative: 1.3 %
Eosinophils Relative: 5.2 %
HCT: 45.9 % (ref 38.5–50.0)
Hemoglobin: 14.6 g/dL (ref 13.2–17.1)
MCH: 26 pg — ABNORMAL LOW (ref 27.0–33.0)
MPV: 9.8 fL (ref 7.5–12.5)
Monocytes Relative: 11.9 %
Neutrophils Relative %: 41.4 %
Platelets: 311 10*3/uL (ref 140–400)
RBC: 5.61 10*6/uL (ref 4.20–5.80)

## 2022-09-15 ENCOUNTER — Ambulatory Visit (INDEPENDENT_AMBULATORY_CARE_PROVIDER_SITE_OTHER): Payer: Medicare Other

## 2022-09-15 DIAGNOSIS — Z87891 Personal history of nicotine dependence: Secondary | ICD-10-CM | POA: Diagnosis not present

## 2022-09-15 DIAGNOSIS — Z122 Encounter for screening for malignant neoplasm of respiratory organs: Secondary | ICD-10-CM | POA: Diagnosis not present

## 2022-09-22 ENCOUNTER — Telehealth: Payer: Self-pay | Admitting: Acute Care

## 2022-09-22 NOTE — Telephone Encounter (Signed)
Results received to lung screening dashboard.   IMPRESSION: 1. Enlarging nodular area of architectural distortion in the left lower lobe categorized as Lung-RADS 4BS, suspicious. Additional imaging evaluation or consultation with Pulmonology or Thoracic Surgery recommended. 2. The "S" modifier above refers to potentially clinically significant non lung cancer related findings. Specifically, there is aortic atherosclerosis, in addition to left main and three-vessel coronary artery disease. Please note that although the presence of coronary artery calcium documents the presence of coronary artery disease, the severity of this disease and any potential stenosis cannot be assessed on this non-gated CT examination. Assessment for potential risk factor modification, dietary therapy or pharmacologic therapy may be warranted, if clinically indicated. 3. Mild diffuse bronchial wall thickening with very mild centrilobular and paraseptal emphysema; imaging findings suggestive of underlying COPD. 4. There are calcifications of the aortic valve. Echocardiographic correlation for evaluation of potential valvular dysfunction may be warranted if clinically indicated.

## 2022-09-25 ENCOUNTER — Telehealth: Payer: Self-pay | Admitting: Acute Care

## 2022-09-25 DIAGNOSIS — R911 Solitary pulmonary nodule: Secondary | ICD-10-CM

## 2022-09-25 NOTE — Telephone Encounter (Signed)
I attempted to call the patient again to go over the results of his screening CT. There was no answer There was no option to leave a message We will try again tomorrow

## 2022-09-25 NOTE — Telephone Encounter (Signed)
I have attempted to call the patient with the results of their  Low Dose CT Chest Lung cancer screening scan. There was no answer. The Voice Mail was full and I was unable to leave a message. There is only one number listed for the patient and there is one number listed as emergency contact . Shane Carrillo [phone]. If we cannot get in touch with the patient tomorrow, we will reach out to his brother.  This is a 4B He has an enlarging left lower lobe nodule that has increased in size from 9.4 mm 08/2021 to 16.1 mm 08/2022. There is also a more solid component.  He needs a PET scan with follow up within a week of scan with myself or Dr. Wyman Songster.  He also has significant CAD . He is not seen by cardiology. He is not on statin.  Will share results with PCP once we get in touch with patient.  [name], we will try again tomorrow to get in touch with patient.  Thanks so much

## 2022-09-25 NOTE — Telephone Encounter (Signed)
See telephone note from 09/25/22

## 2022-09-30 NOTE — Telephone Encounter (Signed)
Spoke with pt and advised of LDCT results. One nodule that has grown in size since last LDCT. We would like to scheduled a PET scan to look at this nodule closer. Pt verbalized understanding and is aware he will get a call to get the PET scheduled. CT results faxed PCP along with notation of CAD. Order placed for PET.

## 2022-09-30 NOTE — Telephone Encounter (Signed)
Attempted to contact pt. Unable to leave a VM. Called and spoke with pt's brother, Sharyl Nimrod. He will get a message to pt to give [place] a call.

## 2022-10-06 ENCOUNTER — Other Ambulatory Visit: Payer: Self-pay

## 2022-10-06 DIAGNOSIS — N138 Other obstructive and reflux uropathy: Secondary | ICD-10-CM | POA: Insufficient documentation

## 2022-10-06 DIAGNOSIS — E7800 Pure hypercholesterolemia, unspecified: Secondary | ICD-10-CM | POA: Insufficient documentation

## 2022-10-06 DIAGNOSIS — I739 Peripheral vascular disease, unspecified: Secondary | ICD-10-CM | POA: Insufficient documentation

## 2022-10-06 DIAGNOSIS — I10 Essential (primary) hypertension: Secondary | ICD-10-CM | POA: Insufficient documentation

## 2022-10-07 ENCOUNTER — Ambulatory Visit: Payer: Medicare Other

## 2022-10-14 ENCOUNTER — Other Ambulatory Visit (HOSPITAL_COMMUNITY): Payer: Medicare Other

## 2022-10-14 ENCOUNTER — Ambulatory Visit (HOSPITAL_COMMUNITY)
Admission: RE | Admit: 2022-10-14 | Discharge: 2022-10-14 | Disposition: A | Discharge status: Home / Self Care | Payer: Medicare Other | Source: Ambulatory Visit | Attending: Acute Care | Admitting: Acute Care

## 2022-10-14 DIAGNOSIS — I77811 Abdominal aortic ectasia: Secondary | ICD-10-CM | POA: Insufficient documentation

## 2022-10-14 DIAGNOSIS — J439 Emphysema, unspecified: Secondary | ICD-10-CM | POA: Diagnosis not present

## 2022-10-14 DIAGNOSIS — J432 Centrilobular emphysema: Secondary | ICD-10-CM | POA: Diagnosis not present

## 2022-10-14 DIAGNOSIS — R911 Solitary pulmonary nodule: Secondary | ICD-10-CM | POA: Diagnosis present

## 2022-10-14 DIAGNOSIS — I700 Atherosclerosis of aorta: Secondary | ICD-10-CM | POA: Diagnosis not present

## 2022-10-14 LAB — GLUCOSE, CAPILLARY: Glucose-Capillary: 88 mg/dL (ref 70–99)

## 2022-10-14 MED ORDER — FLUDEOXYGLUCOSE F - 18 (FDG) INJECTION
7.2800 | Freq: Once | INTRAVENOUS | Status: AC
  Administered 2022-10-14: 7.28 via INTRAVENOUS

## 2022-10-17 ENCOUNTER — Ambulatory Visit (HOSPITAL_COMMUNITY): Payer: Medicare Other

## 2022-10-21 ENCOUNTER — Telehealth: Payer: Self-pay | Admitting: *Deleted

## 2022-10-21 ENCOUNTER — Ambulatory Visit: Payer: Medicare Other | Admitting: Acute Care

## 2022-10-21 NOTE — Telephone Encounter (Signed)
Attempted to call patient x 2 after he missed his appointment today with Kandice Robinsons, NP to follow up his PET scan on 10/14/2022. No answer and voicemail full.Will continue to contact patient to reschedule.

## 2022-11-03 ENCOUNTER — Telehealth: Payer: Self-pay

## 2022-11-03 ENCOUNTER — Encounter: Payer: Self-pay | Admitting: Acute Care

## 2022-11-03 NOTE — Progress Notes (Signed)
Pharmacy Quality Measure Review  This patient is appearing on a report for being at risk of failing the Controlling Blood Pressure measure this calendar year.   Last documented BP 154/76 on 09/01/22  -Attempted to call patient to review medications and assess home BP control, but I did not get an answer and his voicemail was full.  Will attempt to try to call later this week.  Lenna Gilford, PharmD, DPLA

## 2022-11-04 ENCOUNTER — Ambulatory Visit (HOSPITAL_COMMUNITY): Payer: Medicare Other | Attending: Family Medicine

## 2022-11-04 ENCOUNTER — Encounter (HOSPITAL_COMMUNITY): Payer: Self-pay | Admitting: Family Medicine

## 2022-11-28 ENCOUNTER — Ambulatory Visit: Payer: Medicare Other | Admitting: Acute Care

## 2022-11-28 ENCOUNTER — Encounter: Payer: Self-pay | Admitting: Acute Care

## 2022-11-28 VITALS — BP 136/82 | HR 74 | Temp 98.3°F | Ht 65.0 in | Wt 145.6 lb

## 2022-11-28 DIAGNOSIS — Z8521 Personal history of malignant neoplasm of larynx: Secondary | ICD-10-CM

## 2022-11-28 DIAGNOSIS — R911 Solitary pulmonary nodule: Secondary | ICD-10-CM

## 2022-11-28 DIAGNOSIS — Z9889 Other specified postprocedural states: Secondary | ICD-10-CM

## 2022-11-28 DIAGNOSIS — Z87891 Personal history of nicotine dependence: Secondary | ICD-10-CM | POA: Diagnosis not present

## 2022-11-28 DIAGNOSIS — R942 Abnormal results of pulmonary function studies: Secondary | ICD-10-CM | POA: Diagnosis not present

## 2022-11-28 NOTE — Progress Notes (Signed)
History of Present Illness Shane Carrillo is a 77 y.o. male former smoker , quit 2022, with a 103 pack year smoking history. He is followed though the lung cancer screening program since 08/2020.  Synopsis 77 year old male with history HTN/CAD/prior CVA s/p CABG (2008), PVD s/p LE angioplasty on ASA/Plavis (last dose 12/3) with T4N1M0 laryngeal SCC now s/p total laryngectomy, BND, TEP, primary closure on 01/25/2021.  Patient has a tracheostomy as a result of the surgery. Pt. 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 This has been managed at [facility], as noted above.  Patient has had normal low-dose CT screenings until September 15, 2022.  At that point in time his scan was read as a lung RADS 4, suspicious.  There was an enlarging nodular area of architectural distortion in the left lower lobe .  We had a very difficult time getting in touch with the patient to review his scan results.  After total of 3 phone calls we did get in touch with the patient and a PET scan was ordered.  Patient is here today for follow-up to discuss the results of the PET scan and determine next best steps.   11/28/2022 Patient presents for follow-up after PET scan to better evaluate the enlarging nodular area of architectural distortion in the left lower lobe.  PET scan shows the enlarging part solid left lower lobe pulmonary nodule demonstrates a mild hypermetabolic activity and is suspicious for low-grade bronchogenic carcinoma likely adenocarcinoma.  Plan is for biopsy to determine diagnosis followed by treatment appropriate to finding. As patient has received the majority of his care at [facility] he states he wanted to discuss this with his surgeon Dr. Roma Schanz to determine if he wants to have bronchoscopy done at [facility] versus in [facility].  I explained to the patient I do not want to delay care with a new  referral to a pulmonary physician in [facility].  I have asked the patient to let me know Monday, 12/01/2022 so that we can determine where he will have the biopsy and get it scheduled without delaying his care. I have provided the patient with the phone number to call on Monday after speaking with Dr. Roma Schanz so that we can establish where he is going to receive this care in a timely manner. Patient states he is having increased secretions per his trach, he was recently treated with antibiotics by Dr. Roma Schanz.  I have asked him to follow-up with Dr. Roma Schanz or regarding his tracheostomy and issues.  I reiterated that if he develops worsening shortness of breath or is unable to clear his airway he needs to seek emergency care.  He verbalized understanding.  Test Results: PET scan 10/14/2022 The enlarging part solid left lower lobe pulmonary nodule demonstrates mild hypermetabolic activity, suspicious for low-grade bronchogenic carcinoma (likely adenocarcinoma). 2. No evidence of metastatic disease. 3. Aortic Atherosclerosis (ICD10-I70.0) and Emphysema (ICD10-J43.9).    LDCT 09/15/2022 Enlarging nodular area of architectural distortion in the left lower lobe categorized as Lung-RADS 4BS, suspicious. Additional imaging evaluation or consultation with Pulmonology or Thoracic Surgery recommended. 2. The "S" modifier above refers to potentially clinically significant non lung cancer related findings. Specifically, there is aortic atherosclerosis, in addition to left main and three-vessel coronary artery disease. Please note that although the presence of coronary artery calcium documents the presence of coronary artery disease, the severity  of this disease and any potential stenosis cannot be assessed on this non-gated CT examination. Assessment for potential risk factor modification, dietary therapy or pharmacologic therapy may be warranted, if clinically indicated. 3. Mild diffuse bronchial  wall thickening with very mild centrilobular and paraseptal emphysema; imaging findings suggestive of underlying COPD. 4. There are calcifications of the aortic valve. Echocardiographic correlation for evaluation of potential valvular dysfunction may be warranted if clinically indicated.      Latest Ref Rng & Units 09/01/2022   11:16 AM 03/24/2022    2:25 PM 03/12/2022    1:17 PM  CBC  WBC 3.8 - 10.8 Thousand/uL 4.6  3.6  3.7   Hemoglobin 13.2 - 17.1 g/dL 16.1  09.6  04.5   Hematocrit 38.5 - 50.0 % 45.9  43.7  45.9   Platelets 140 - 400 Thousand/uL 311  376  436        Latest Ref Rng & Units 09/01/2022   11:16 AM 03/12/2022    1:17 PM 11/21/2021   12:00 AM  BMP  Glucose 65 - 99 mg/dL 90  92  87   BUN 7 - 25 mg/dL 15  17  20    Creatinine 0.70 - 1.28 mg/dL 4.09  8.11  9.14   BUN/Creat Ratio 6 - 22 (calc) SEE NOTE:  SEE NOTE:  SEE NOTE:   Sodium 135 - 146 mmol/L 140  141  138   Potassium 3.5 - 5.3 mmol/L 4.3  5.0  4.4   Chloride 98 - 110 mmol/L 107  105  106   CO2 20 - 32 mmol/L 23  29  25    Calcium 8.6 - 10.3 mg/dL 9.3  9.5  9.3     BNP    Component Value Date/Time   BNP 52.4 06/17/2019 0942    ProBNP    Component Value Date/Time   PROBNP 66.0 02/25/2018 1510    PFT No results found for: "FEV1PRE", "FEV1POST", "FVCPRE", "FVCPOST", "TLC", "DLCOUNC", "PREFEV1FVCRT", "PSTFEV1FVCRT"  No results found.   Past medical hx Past Medical History:  Diagnosis Date   Abdominal aortic aneurysm (AAA) 3.0 cm to 5.5 cm in diameter in male [facility]) 08/28/2016   Formatting of this note might be different from the original.  08/28/2016 AAA screening US with 2.9 cm enlargement, repeat in 5 yrs, pt encouraged to stop smoking & to keep HTN controlled  Formatting of this note might be different from the original.  Overview:   08/28/2016 AAA screening US with 2.9 cm enlargement, repeat in 5 yrs, pt encouraged to stop smoking & to keep HTN controlled     Abdominal aortic ectasia (HCC) 08/28/2016    Formatting of this note might be different from the original. Overview: 08/28/2016 AAA screening US with 2.9 cm enlargement, repeat in 5 yrs, pt encouraged to stop smoking & to keep HTN controlled     Abnormal TSH 11/02/2018   Acute coronary syndrome (HCC) 10/09/2018   Acute pain of right knee 01/02/2020   Adjustment disorder with depressed mood 03/26/2021   Allergic rhinitis 06/03/2019   Benign prostatic hyperplasia with urinary frequency 11/02/2018   Benign prostatic hyperplasia with weak urinary stream 02/28/2016   BPH with obstruction/lower urinary tract symptoms    Breast lesion 08/20/2022   CAD (coronary artery disease) 11/02/2018   Cigarette smoker 02/26/2018   Community acquired pneumonia 06/03/2021   COPD (chronic obstructive pulmonary disease) (HCC)    Depression, major, single episode, moderate (HCC) 08/20/2022   DOE (dyspnea on exertion)  spirometry 02/2018 normal, but needs full PFT's per Dr. Sherene Sires 03/2018.  No bronchodilators indicated as of 03/2018.   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   Mass of skin of back 08/20/2022   Mid back pain 02/01/2019   Nicotine dependence, cigarettes, uncomplicated 02/28/2016   PAD (peripheral artery disease) (HCC)    Abd aortic athero   Peripheral arterial disease (HCC) 01/26/2019   Peripheral arterial disease     Radiculopathy of cervicothoracic region 09/13/2021   Swelling of lower leg 12/26/2021   Syncope 09/01/2022   Syncope and collapse 08/23/2022   Thoracic aortic aneurysm (HCC) 12/31/2021   Tracheostomy  dependent (HCC) 09/13/2021     Social History   Tobacco Use   Smoking status: Former    Current packs/day: 0.00    Average packs/day: 1.8 packs/day for 59.0 years (103.3 ttl pk-yrs)    Types: Cigarettes    Start date: 07/18/1961    Quit date: 07/18/2020    Years since quitting: 2.3   Smokeless tobacco: Never  Vaping Use   Vaping status: Never Used  Substance Use Topics   Alcohol use: Not Currently   Drug use: No    Mr.Presley reports that he quit smoking about 2 years ago. His smoking use included cigarettes. He started smoking about 61 years ago. He has a 103.3 pack-year smoking history. He has never used smokeless tobacco. He reports that he does not currently use alcohol. He reports that he does not use drugs.  Tobacco Cessation: Counseling given: Not Answered Former smoker quit 2022 with a 103.3-pack-year smoking history  Past surgical hx, Family hx, Social hx all reviewed.  Current Outpatient Medications on File Prior to Visit  Medication Sig   albuterol (PROAIR HFA) 108 (90 Base) MCG/ACT inhaler Inhale 2 puffs into the lungs every 6 (six) hours as needed for wheezing or shortness of breath.   amLODipine (NORVASC) 10 MG tablet Take 10 mg by mouth daily.   aspirin EC 325 MG tablet Take 325 mg by mouth daily.   cetirizine (ZYRTEC) 10 MG tablet Take 1 tablet (10 mg total) by mouth daily.   clopidogrel (PLAVIX) 75 MG tablet Take 75 mg by mouth daily.   ipratropium-albuterol (DUONEB) 0.5-2.5 (3) MG/3ML SOLN Take 3 mLs by nebulization every 2 (two) hours as needed (wheeze, SOB).   lidocaine (XYLOCAINE) 2 % jelly Apply topically tid (plz sched with new provider for future fills)   losartan (COZAAR) 100 MG tablet Take 1 tablet (100 mg total) by mouth daily.   mirtazapine (REMERON) 15 MG tablet Take 1 tablet (15 mg total) by mouth at bedtime.   nitroGLYCERIN (NITROSTAT) 0.4 MG SL tablet Place 0.4 mg under the tongue every 5 (five) minutes as needed for chest pain.   Omega-3 Fatty  Acids (FISH OIL) 1000 MG CAPS Take 1 g by mouth daily.   pantoprazole (PROTONIX) 40 MG tablet Take 1 tablet (40 mg total) by mouth daily.   sildenafil (VIAGRA) 100 MG tablet TAKE ONE TABLET BY MOUTH DAILY AS NEEDED FOR ERECTILE DYSFUNCTION.   No current facility-administered medications on file prior to  visit.     Allergies  Allergen Reactions   Atorvastatin Itching   Sildenafil Palpitations   Simvastatin Itching   Aspirin Other (See Comments)    GI upset - Pt states that he is able to take the coated form     Review Of Systems:  Constitutional:   No  weight loss, night sweats,  Fevers, chills, fatigue, or  lassitude.  HEENT:   No headaches,  Difficulty swallowing,  Tooth/dental problems, or  Sore throat,                No sneezing, itching, ear ache, nasal congestion, post nasal drip,   CV:  No chest pain,  Orthopnea, PND, swelling in lower extremities, anasarca, dizziness, palpitations, syncope.   GI  No heartburn, indigestion, abdominal pain, nausea, vomiting, diarrhea, change in bowel habits, loss of appetite, bloody stools.   Resp: No shortness of breath with exertion or at rest.  Positive excess mucus, positive productive cough,  No non-productive cough,  No coughing up of blood.  No change in color of mucus.  No wheezing.  No chest wall deformity  Skin: no rash or lesions.  GU: no dysuria, change in color of urine, no urgency or frequency.  No flank pain, no hematuria   MS:  No joint pain or swelling.  No decreased range of motion.  No back pain.  Psych:  No change in mood or affect. No depression or anxiety.  No memory loss.   Vital Signs BP 136/82 (BP Location: Left Arm, Patient Position: Sitting, Cuff Size: Normal)   Pulse 74   Temp 98.3 F (36.8 C) (Oral)   Ht 5\' 5"  (1.651 m)   Wt 145 lb 9.6 oz (66 kg)   SpO2 99%   BMI 24.23 kg/m    Physical Exam:  General- No distress,  A&Ox3, tracheostomy ENT: No sinus tenderness, TM clear, pale nasal mucosa, no oral  exudate,no post nasal drip, no LAN Cardiac: S1, S2, regular rate and rhythm, no murmur Chest: No wheeze/ rales/ dullness; no accessory muscle use, no nasal flaring, no sternal retractions, copious tracheal secretions that are thick and difficult for patient to cough up, they are white in color Abd.: Soft Non-tender, nondistended, bowel sounds positive,Body mass index is 24.23 kg/m.  Ext: No clubbing cyanosis, edema Neuro:  normal strength, moving all extremities x 4, alert and oriented x 3, appropriate Skin: No rashes, warm and dry Psych: normal mood and behavior   Assessment/Plan Abnormal lung cancer screening scan and abnormal PET scan and patient with previous history of laryngeal cancer status post tracheostomy and bilateral neck dissection in 01/06/2021 Former smoker with a 103-pack-year smoking history Plan Your PET scan does show the nodule of concern in the left lower lobe has uptake on the PET scan. This is concerning for lung cancer, so we will need to biopsy the area. You and I have discussed that you may want to talk with Dr. Roma Schanz about having this done in [place]. Please speak with  him and call this office Back by Monday 12/01/2022 to let us know if you want [place]  to proceed with bronchoscopy here in [place], or if you will have this done in [facility] by a pulmonary doctor he would refer you to for care.   The number here to call is [phone].  We need to decide quickly because we do not want to delay care or managing the growing pulmonary nodule.  Please follow up with Dr. Roma Schanz  about your trach issues.  If you develop breathing issues seek emergency care. Please contact office for sooner follow up if symptoms do not improve or worsen or seek emergency care     I spent 40 minutes dedicated to the care of this patient on the date of this encounter to include pre-visit review of records, face-to-face time with the patient discussing conditions above, post visit  ordering of testing, clinical documentation with the electronic health record, making appropriate referrals as documented, and communicating necessary information to the patient's healthcare team.    Bevelyn Ngo, NP 11/28/2022  11:51 AM

## 2022-11-28 NOTE — Patient Instructions (Addendum)
It is good to see you today. Your PET scan does show the nodule of concern in the left lower lobe has uptake on the PET scan. This is concerning for lung cancer, so we will need to biopsy the area. You and I have discussed that you may want to talk with Dr. Roma Schanz about having this done in [place]. Please speak with  him and call this office Back by Monday 12/01/2022 to let us know if you want [place]  to proceed with bronchoscopy here in [place], or if you will have this done in [facility] by a pulmonary doctor he would refer you to for care.   The number here to call is [phone].  We need to decide quickly because we do not want to delay managing the growing pulmonary nodule.  Please follow up with Dr. Roma Schanz about your trach issues.  If you develop breathing issues seek emergency care. Please contact office for sooner follow up if symptoms do not improve or worsen or seek emergency care

## 2022-12-08 ENCOUNTER — Ambulatory Visit (INDEPENDENT_AMBULATORY_CARE_PROVIDER_SITE_OTHER): Payer: Medicare Other

## 2022-12-08 ENCOUNTER — Ambulatory Visit (INDEPENDENT_AMBULATORY_CARE_PROVIDER_SITE_OTHER): Payer: Medicare Other | Admitting: Family Medicine

## 2022-12-08 ENCOUNTER — Encounter: Payer: Self-pay | Admitting: Family Medicine

## 2022-12-08 VITALS — BP 177/78 | HR 80 | Ht 65.0 in | Wt 144.0 lb

## 2022-12-08 DIAGNOSIS — J441 Chronic obstructive pulmonary disease with (acute) exacerbation: Secondary | ICD-10-CM

## 2022-12-08 DIAGNOSIS — M79602 Pain in left arm: Secondary | ICD-10-CM

## 2022-12-08 DIAGNOSIS — R0989 Other specified symptoms and signs involving the circulatory and respiratory systems: Secondary | ICD-10-CM

## 2022-12-08 DIAGNOSIS — I809 Phlebitis and thrombophlebitis of unspecified site: Secondary | ICD-10-CM | POA: Diagnosis not present

## 2022-12-08 MED ORDER — DOXYCYCLINE HYCLATE 100 MG PO TABS: 100.0000 mg | ORAL_TABLET | Freq: Two times a day (BID) | ORAL | 0 refills | Status: DC

## 2022-12-08 NOTE — Assessment & Plan Note (Signed)
Stat ultrasound ordered to rule out DVT of the upper extremity especially with history of malignancy.

## 2022-12-08 NOTE — Progress Notes (Signed)
Shane Carrillo - 77 y.o. male MRN [id number]  Date of birth: March 14, 1945  Subjective Chief Complaint  Patient presents with   Follow-up    HPI Shane Carrillo is a 77 y.o. male here today with complaint of chest congestion with increased thickened sputum production.  He has tried suctioning his trach with only little relief.  Recently noted to have hypermetabolic lesion in the left lung on PET scan.  He has an appointment with oncology/pulmonology at [facility] later this week.  He did complete a course of Levaquin 500 mg x 5 days at the end of September but symptoms never fully resolved.  He denies fever or chills.   He also has pain and swelling along veins of the left hand.  He has had this for about for 5 days.  Has not tried anything for treatment so far.  ROS:  A comprehensive ROS was completed and negative except as noted per HPI  Allergies  Allergen Reactions   Atorvastatin Itching   Sildenafil Palpitations   Simvastatin Itching   Aspirin Other (See Comments)    GI upset - Pt states that he is able to take the coated form     Past Medical History:  Diagnosis Date   Abdominal aortic aneurysm (AAA) 3.0 cm to 5.5 cm in diameter in male [facility]) 08/28/2016   Formatting of this note might be different from the original.  08/28/2016 AAA screening US with 2.9 cm enlargement, repeat in 5 yrs, pt encouraged to stop smoking & to keep HTN controlled  Formatting of this note might be different from the original.  Overview:   08/28/2016 AAA screening US with 2.9 cm enlargement, repeat in 5 yrs, pt encouraged to stop smoking & to keep HTN controlled     Abdominal aortic ectasia (HCC) 08/28/2016   Formatting of this note might be different from the original. Overview: 08/28/2016 AAA screening US with 2.9 cm enlargement, repeat in 5 yrs, pt encouraged to stop smoking & to keep HTN controlled     Abnormal TSH 11/02/2018   Acute coronary syndrome (HCC) 10/09/2018   Acute pain of right knee  01/02/2020   Adjustment disorder with depressed mood 03/26/2021   Allergic rhinitis 06/03/2019   Benign prostatic hyperplasia with urinary frequency 11/02/2018   Benign prostatic hyperplasia with weak urinary stream 02/28/2016   BPH with obstruction/lower urinary tract symptoms    Breast lesion 08/20/2022   CAD (coronary artery disease) 11/02/2018   Cigarette smoker 02/26/2018   Community acquired pneumonia 06/03/2021   COPD (chronic obstructive pulmonary disease) (HCC)    Depression, major, single episode, moderate (HCC) 08/20/2022   DOE (dyspnea on exertion)    spirometry 02/2018 normal, but needs full PFT's per Dr. Sherene Sires 03/2018.  No bronchodilators indicated as of 03/2018.   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   Mass of skin of back 08/20/2022   Mid back pain 02/01/2019   Nicotine dependence, cigarettes, uncomplicated 02/28/2016   PAD (peripheral artery disease) (HCC)    Abd aortic athero   Peripheral arterial disease (HCC) 01/26/2019  Peripheral arterial disease     Radiculopathy of cervicothoracic region 09/13/2021   Swelling of lower leg 12/26/2021   Syncope 09/01/2022   Syncope and collapse 08/23/2022   Thoracic aortic aneurysm (HCC) 12/31/2021   Tracheostomy dependent (HCC) 09/13/2021    Past Surgical History:  Procedure Laterality Date   ABDOMINAL AORTOGRAM W/LOWER EXTREMITY N/A 02/24/2019   Procedure: ABDOMINAL AORTOGRAM W/LOWER EXTREMITY;  Surgeon: Runell Gess, MD;  Location: [facility] INVASIVE CV LAB;  Service: Cardiovascular;  Laterality: N/A;    CORONARY ANGIOPLASTY WITH STENT PLACEMENT     Dr. Dareen Piano in [place], [place].   FOOT SURGERY     PERIPHERAL VASCULAR INTERVENTION Left 02/24/2019   Procedure: PERIPHERAL VASCULAR INTERVENTION;  Surgeon: Runell Gess, MD;  Location: [facility] INVASIVE CV LAB;  Service: Cardiovascular;  Laterality: Left;   ROTATOR CUFF REPAIR      Social History   Socioeconomic History   Marital status: Divorced    Spouse name: Not on file   Number of children: Not on file   Years of education: 12th grade   Highest education level: Not on file  Occupational History    Comment: Retired  Tobacco Use   Smoking status: Former    Current packs/day: 0.00    Average packs/day: 1.8 packs/day for 59.0 years (103.3 ttl pk-yrs)    Types: Cigarettes    Start date: 07/18/1961    Quit date: 07/18/2020    Years since quitting: 2.3   Smokeless tobacco: Never  Vaping Use   Vaping status: Never Used  Substance and Sexual Activity   Alcohol use: Not Currently   Drug use: No   Sexual activity: Not on file  Other Topics Concern   Not on file  Social History Narrative   Lives alone. He has 12 grown children. He enjoys going to the race track and the casino.   Social Determinants of Health   Financial Resource Strain: Medium Risk (08/21/2020)   Overall Financial Resource Strain (CARDIA)    Difficulty of Paying Living Expenses: Somewhat hard  Food Insecurity: No Food Insecurity (08/23/2022)   Received from [facility], [facility]   Hunger Vital Sign    Worried About Running Out of Food in the Last Year: Never true    Ran Out of Food in the Last Year: Never true  Transportation Needs: No Transportation Needs (08/23/2022)   Received from [facility], [facility]   PRAPARE - Transportation    Lack of Transportation (Medical): No    Lack of Transportation (Non-Medical): No  Physical Activity: Insufficiently Active (08/21/2020)   Exercise Vital Sign    Days of Exercise per Week: 3 days    Minutes of Exercise per  Session: 30 min  Stress: No Stress Concern Present (08/23/2022)   Received from [facility], [facility] - Occupational Stress Questionnaire    Feeling of Stress : Only a little  Social Connections: Unknown (06/17/2021)   Received from [facility], [facility]   Social Network    Social Network: Not on file    Family History  Problem Relation Age of Onset   Cancer - Cervical Mother    Colon cancer Mother    Colon cancer Father    Prostate cancer Father    Diabetes Maternal Grandmother     Health Maintenance  Topic Date Due   COVID-19 Vaccine (1) Never done   Hepatitis C Screening  Never done   Zoster Vaccines- Shingrix (1 of  2) Never done   DTaP/Tdap/Td (3 - Td or Tdap) 01/07/2020   Medicare Annual Wellness (AWV)  08/21/2021   INFLUENZA VACCINE  Never done   Pneumonia Vaccine 51+ Years old (2 of 2 - PPSV23 or PCV20) 09/01/2023 (Originally 02/27/2017)   Fecal DNA (Cologuard)  09/01/2023 (Originally 03/21/2019)   Lung Cancer Screening  09/15/2023   HPV VACCINES  Aged Out     ----------------------------------------------------------------------------------------------------------------------------------------------------------------------------------------------------------------- Physical Exam BP (!) 177/78   Pulse 80   Ht 5\' 5"  (1.651 m)   Wt 144 lb (65.3 kg)   SpO2 95%   BMI 23.96 kg/m   Physical Exam Constitutional:      Appearance: Normal appearance.  Eyes:     General: No scleral icterus. Cardiovascular:     Rate and Rhythm: Normal rate and regular rhythm.  Pulmonary:     Effort: Pulmonary effort is normal.     Comments: Coarse breath sounds bilaterally Musculoskeletal:     Cervical back: Neck supple.  Skin:    Comments: Tender veins along the left arm.  Mild swelling  Neurological:     General: No focal deficit present.     Mental Status: He is alert.  Psychiatric:        Mood and Affect: Mood normal.         Behavior: Behavior normal.     ------------------------------------------------------------------------------------------------------------------------------------------------------------------------------------------------------------------- Assessment and Plan  COPD exacerbation (HCC) Recommend continue nebulizer treatments.  Adding course of doxycycline.  Instructed to contact clinic if having worsening symptoms.  Thrombophlebitis Stat ultrasound ordered to rule out DVT of the upper extremity especially with history of malignancy.   Meds ordered this encounter  Medications   doxycycline (VIBRA-TABS) 100 MG tablet    Sig: Take 1 tablet (100 mg total) by mouth 2 (two) times daily.    Dispense:  20 tablet    Refill:  0    Return in about 2 weeks (around 12/22/2022) for nurse visit for BP check.    This visit occurred during the SARS-CoV-2 public health emergency.  Safety protocols were in place, including screening questions prior to the visit, additional usage of staff PPE, and extensive cleaning of exam room while observing appropriate contact time as indicated for disinfecting solutions.

## 2022-12-08 NOTE — Assessment & Plan Note (Signed)
Recommend continue nebulizer treatments.  Adding course of doxycycline.  Instructed to contact clinic if having worsening symptoms.

## 2022-12-22 ENCOUNTER — Ambulatory Visit: Payer: Medicare Other | Admitting: Family Medicine

## 2022-12-26 ENCOUNTER — Ambulatory Visit: Payer: Medicare Other | Admitting: Acute Care

## 2022-12-29 ENCOUNTER — Ambulatory Visit: Payer: Medicare Other | Admitting: Family Medicine

## 2023-01-09 ENCOUNTER — Encounter: Payer: Self-pay | Admitting: Family Medicine

## 2023-01-09 ENCOUNTER — Ambulatory Visit (INDEPENDENT_AMBULATORY_CARE_PROVIDER_SITE_OTHER): Payer: Medicare Other | Admitting: Family Medicine

## 2023-01-09 VITALS — BP 196/89 | HR 73 | Ht 65.0 in | Wt 149.0 lb

## 2023-01-09 DIAGNOSIS — I739 Peripheral vascular disease, unspecified: Secondary | ICD-10-CM

## 2023-01-09 DIAGNOSIS — Z01810 Encounter for preprocedural cardiovascular examination: Secondary | ICD-10-CM

## 2023-01-09 DIAGNOSIS — J441 Chronic obstructive pulmonary disease with (acute) exacerbation: Secondary | ICD-10-CM | POA: Diagnosis not present

## 2023-01-09 DIAGNOSIS — I25709 Atherosclerosis of coronary artery bypass graft(s), unspecified, with unspecified angina pectoris: Secondary | ICD-10-CM | POA: Diagnosis not present

## 2023-01-09 MED ORDER — PREDNISONE 50 MG PO TABS: ORAL_TABLET | ORAL | 0 refills | Status: DC

## 2023-01-09 MED ORDER — DOXYCYCLINE HYCLATE 100 MG PO TABS: 100.0000 mg | ORAL_TABLET | Freq: Two times a day (BID) | ORAL | 0 refills | Status: DC

## 2023-01-09 NOTE — Progress Notes (Signed)
JEORGE MUNRO - 77 y.o. male MRN [id number]  Date of birth: 1945/06/21  Subjective Chief Complaint  Patient presents with   Consult    HPI JONTHOMAS ANAGNOS is a 77 y.o. male here today for follow up visit.   He has upcoming biopsy of lung lesion.  He is quite anxious and has concerns that he may not make it through the procedure.  He doesn't feel that his heart is strong enough.  He does report having some tightness in his chest and more dyspnea.  He is coughing more and producing more mucus recently.  He continues to use his finger instead of speaking valve.  He denies palpitations, or dizziness.  Echocardiogram ordered in July but he never scheduled this.  He has seen CT surgery in the past but does not currently have a general cardiologist.   ROS:  A comprehensive ROS was completed and negative except as noted per HPI  Allergies  Allergen Reactions   Atorvastatin Itching   Sildenafil Palpitations   Simvastatin Itching   Aspirin Other (See Comments)    GI upset - Pt states that he is able to take the coated form     Past Medical History:  Diagnosis Date   Abdominal aortic aneurysm (AAA) 3.0 cm to 5.5 cm in diameter in male [facility]) 08/28/2016   Formatting of this note might be different from the original.  08/28/2016 AAA screening US with 2.9 cm enlargement, repeat in 5 yrs, pt encouraged to stop smoking & to keep HTN controlled  Formatting of this note might be different from the original.  Overview:   08/28/2016 AAA screening US with 2.9 cm enlargement, repeat in 5 yrs, pt encouraged to stop smoking & to keep HTN controlled     Abdominal aortic ectasia (HCC) 08/28/2016   Formatting of this note might be different from the original. Overview: 08/28/2016 AAA screening US with 2.9 cm enlargement, repeat in 5 yrs, pt encouraged to stop smoking & to keep HTN controlled     Abnormal TSH 11/02/2018   Acute coronary syndrome (HCC) 10/09/2018   Acute pain of right knee 01/02/2020    Adjustment disorder with depressed mood 03/26/2021   Allergic rhinitis 06/03/2019   Benign prostatic hyperplasia with urinary frequency 11/02/2018   Benign prostatic hyperplasia with weak urinary stream 02/28/2016   BPH with obstruction/lower urinary tract symptoms    Breast lesion 08/20/2022   CAD (coronary artery disease) 11/02/2018   Cigarette smoker 02/26/2018   Community acquired pneumonia 06/03/2021   COPD (chronic obstructive pulmonary disease) (HCC)    Depression, major, single episode, moderate (HCC) 08/20/2022   DOE (dyspnea on exertion)    spirometry 02/2018 normal, but needs full PFT's per Dr. Sherene Sires 03/2018.  No bronchodilators indicated as of 03/2018.   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   Mass of skin of back 08/20/2022   Mid back pain 02/01/2019   Nicotine dependence, cigarettes, uncomplicated 02/28/2016   PAD (peripheral artery disease) (HCC)    Abd aortic athero   Peripheral arterial disease (HCC) 01/26/2019   Peripheral arterial disease  Radiculopathy of cervicothoracic region 09/13/2021   Swelling of lower leg 12/26/2021   Syncope 09/01/2022   Syncope and collapse 08/23/2022   Thoracic aortic aneurysm (HCC) 12/31/2021   Tracheostomy dependent (HCC) 09/13/2021    Past Surgical History:  Procedure Laterality Date   ABDOMINAL AORTOGRAM W/LOWER EXTREMITY N/A 02/24/2019   Procedure: ABDOMINAL AORTOGRAM W/LOWER EXTREMITY;  Surgeon: Runell Gess, MD;  Location: [facility] INVASIVE CV LAB;  Service: Cardiovascular;  Laterality: N/A;   CORONARY  ANGIOPLASTY WITH STENT PLACEMENT     Dr. Dareen Piano in [place], [place].   FOOT SURGERY     PERIPHERAL VASCULAR INTERVENTION Left 02/24/2019   Procedure: PERIPHERAL VASCULAR INTERVENTION;  Surgeon: Runell Gess, MD;  [facility] INVASIVE CV LAB;  Service: Cardiovascular;  Laterality: Left;   ROTATOR CUFF REPAIR      Social History   Socioeconomic History   Marital status: Divorced    Spouse name: Not on file   Number of children: Not on file   Years of education: 12th grade   Highest education level: Not on file  Occupational History    Comment: Retired  Tobacco Use   Smoking status: Former    Current packs/day: 0.00    Average packs/day: 1.8 packs/day for 59.0 years (103.3 ttl pk-yrs)    Types: Cigarettes    Start date: 07/18/1961    Quit date: 07/18/2020    Years since quitting: 2.4   Smokeless tobacco: Never  Vaping Use   Vaping status: Never Used  Substance and Sexual Activity   Alcohol use: Not Currently   Drug use: No   Sexual activity: Not on file  Other Topics Concern   Not on file  Social History Narrative   Lives alone. He has 12 grown children. He enjoys going to the race track and the casino.   Social Determinants of Health   Financial Resource Strain: Medium Risk (08/21/2020)   Overall Financial Resource Strain (CARDIA)    Difficulty of Paying Living Expenses: Somewhat hard  Food Insecurity: No Food Insecurity (08/23/2022)   Received from [facility], [facility]   Hunger Vital Sign    Worried About Running Out of Food in the Last Year: Never true    Ran Out of Food in the Last Year: Never true  Transportation Needs: No Transportation Needs (08/23/2022)   [facility] from [facility], [facility]   PRAPARE - Transportation    Lack of Transportation (Medical): No    Lack of Transportation (Non-Medical): No  Physical Activity: Insufficiently Active (08/21/2020)   Exercise Vital Sign    Days of Exercise per Week: 3 days    Minutes of Exercise per Session: 30  min  Stress: No Stress Concern Present (08/23/2022)   Received from [facility], [facility] of Occupational Health - Occupational Stress Questionnaire    Feeling of Stress : Only a little  Social Connections: Unknown (06/17/2021)   Received from [facility], [facility]   Social Network    Social Network: Not on file    Family History  Problem Relation Age of Onset   Cancer - Cervical Mother    Colon cancer Mother    Colon cancer Father    Prostate cancer Father    Diabetes Maternal Grandmother     Health Maintenance  Topic Date Due   COVID-19 Vaccine (1) Never done   Hepatitis C Screening  Never done   Zoster Vaccines- Shingrix (1 of 2) Never done   DTaP/Tdap/Td (3 -  Td or Tdap) 01/07/2020   Medicare Annual Wellness (AWV)  08/21/2021   INFLUENZA VACCINE  Never done   Pneumonia Vaccine 64+ Years old (2 of 2 - PPSV23 or PCV20) 09/01/2023 (Originally 02/27/2017)   Lung Cancer Screening  09/15/2023   HPV VACCINES  Aged Out   Fecal DNA (Cologuard)  Discontinued     ----------------------------------------------------------------------------------------------------------------------------------------------------------------------------------------------------------------- Physical Exam BP (!) 196/89 (BP Location: Left Arm, Patient Position: Sitting, Cuff Size: Normal)   Pulse 73   Ht 5\' 5"  (1.651 m)   Wt 149 lb (67.6 kg)   SpO2 98%   BMI 24.79 kg/m   Physical Exam Constitutional:      Appearance: Normal appearance.  Eyes:     General: No scleral icterus. Cardiovascular:     Rate and Rhythm: Normal rate and regular rhythm.  Pulmonary:     Effort: Pulmonary effort is normal.     Breath sounds: Normal breath sounds.  Musculoskeletal:     Cervical back: Neck supple.  Neurological:     Mental Status: He is alert.  Psychiatric:        Mood and Affect: Mood normal.        Behavior: Behavior normal.      ------------------------------------------------------------------------------------------------------------------------------------------------------------------------------------------------------------------- Assessment and Plan  CAD (coronary artery disease) He has upcoming lung biopsy.  He has had some anginal type symptoms with history of CAD.  Urgent referral placed to cardiology for cardiac evaluation and clearance prior to procedure.    COPD exacerbation (HCC) Recommend continue nebulizer treatments.  Adding course of doxycycline.  Adding burst of prednisone.  Instructed to contact clinic if having worsening symptoms.   Meds ordered this encounter  Medications   doxycycline (VIBRA-TABS) 100 MG tablet    Sig: Take 1 tablet (100 mg total) by mouth 2 (two) times daily.    Dispense:  20 tablet    Refill:  0   predniSONE (DELTASONE) 50 MG tablet    Sig: Take 1 tab po daily x5 days.    Dispense:  5 tablet    Refill:  0    No follow-ups on file.    This visit occurred during the SARS-CoV-2 public health emergency.  Safety protocols were in place, including screening questions prior to the visit, additional usage of staff PPE, and extensive cleaning of exam room while observing appropriate contact time as indicated for disinfecting solutions.

## 2023-01-09 NOTE — Patient Instructions (Addendum)
I have placed an urgent referral to cardiology for further evaluation of your heart.    Start doxycycline and prednisone for increased chest congestion and mucus production.

## 2023-01-09 NOTE — Assessment & Plan Note (Signed)
Recommend continue nebulizer treatments.  Adding course of doxycycline.  Adding burst of prednisone.  Instructed to contact clinic if having worsening symptoms.

## 2023-01-09 NOTE — Assessment & Plan Note (Signed)
He has upcoming lung biopsy.  He has had some anginal type symptoms with history of CAD.  Urgent referral placed to cardiology for cardiac evaluation and clearance prior to procedure.

## 2023-01-13 ENCOUNTER — Other Ambulatory Visit: Payer: Self-pay

## 2023-01-30 DIAGNOSIS — Z9002 Acquired absence of larynx: Secondary | ICD-10-CM | POA: Insufficient documentation

## 2023-03-12 ENCOUNTER — Encounter: Payer: Self-pay | Admitting: Family Medicine

## 2023-03-12 ENCOUNTER — Ambulatory Visit (INDEPENDENT_AMBULATORY_CARE_PROVIDER_SITE_OTHER): Payer: Medicare Other | Admitting: Family Medicine

## 2023-03-12 VITALS — BP 169/88 | HR 77 | Ht 65.0 in | Wt 150.0 lb

## 2023-03-12 DIAGNOSIS — R918 Other nonspecific abnormal finding of lung field: Secondary | ICD-10-CM | POA: Diagnosis not present

## 2023-03-12 DIAGNOSIS — Z930 Tracheostomy status: Secondary | ICD-10-CM

## 2023-03-15 DIAGNOSIS — R918 Other nonspecific abnormal finding of lung field: Secondary | ICD-10-CM | POA: Insufficient documentation

## 2023-03-15 NOTE — Progress Notes (Signed)
Shane Carrillo - 78 y.o. male MRN [id number]  Date of birth: 14-Jul-1945  Subjective Chief Complaint  Patient presents with   Tracheostomy Tube Change    HPI Shane Carrillo is a 78 y.o. male here today for follow-up visit.  He was recently seen by ENT.  Reports having foreign body within his tracheostomy.  Reports that he has not heard anything back from his ENT regarding surgery for this.  Additionally he was seen by surgical oncology and is planning on having lung biopsy.  He reports that he has not heard from either of these regarding scheduling.  He is also nervous about having lung biopsy.  Looking back at notes through care everywhere does appear that his son has been contacted a couple of times to schedule him.   ROS:  A comprehensive ROS was completed and negative except as noted per HPI  Allergies  Allergen Reactions   Atorvastatin Itching   Sildenafil Palpitations   Simvastatin Itching   Aspirin Other (See Comments)    GI upset - Pt states that he is able to take the coated form     Past Medical History:  Diagnosis Date   Abdominal aortic aneurysm (AAA) 3.0 cm to 5.5 cm in diameter in male [facility]) 08/28/2016   Formatting of this note might be different from the original.  08/28/2016 AAA screening US with 2.9 cm enlargement, repeat in 5 yrs, pt encouraged to stop smoking & to keep HTN controlled  Formatting of this note might be different from the original.  Overview:   08/28/2016 AAA screening US with 2.9 cm enlargement, repeat in 5 yrs, pt encouraged to stop smoking & to keep HTN controlled     Abdominal aortic ectasia (HCC) 08/28/2016   Formatting of this note might be different from the original. Overview: 08/28/2016 AAA screening US with 2.9 cm enlargement, repeat in 5 yrs, pt encouraged to stop smoking & to keep HTN controlled     Abnormal TSH 11/02/2018   Acute coronary syndrome (HCC) 10/09/2018   Acute pain of right knee 01/02/2020   Adjustment disorder with depressed  mood 03/26/2021   Allergic rhinitis 06/03/2019   Benign prostatic hyperplasia with urinary frequency 11/02/2018   Benign prostatic hyperplasia with weak urinary stream 02/28/2016   BPH with obstruction/lower urinary tract symptoms    Breast lesion 08/20/2022   CAD (coronary artery disease) 11/02/2018   Cigarette smoker 02/26/2018   Community acquired pneumonia 06/03/2021   COPD (chronic obstructive pulmonary disease) (HCC)    Depression, major, single episode, moderate (HCC) 08/20/2022   DOE (dyspnea on exertion)    spirometry 02/2018 normal, but needs full PFT's per Dr. Sherene Sires 03/2018.  No bronchodilators indicated as of 03/2018.   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   Mass of skin of back 08/20/2022   Mid back pain 02/01/2019   Nicotine dependence, cigarettes, uncomplicated 02/28/2016   PAD (peripheral artery disease) (HCC)    Abd aortic athero   Peripheral arterial disease (HCC) 01/26/2019   Peripheral arterial disease     Radiculopathy of cervicothoracic region 09/13/2021  Swelling of lower leg 12/26/2021   Syncope 09/01/2022   Syncope and collapse 08/23/2022   Thoracic aortic aneurysm (HCC) 12/31/2021   Tracheostomy dependent (HCC) 09/13/2021    Past Surgical History:  Procedure Laterality Date   ABDOMINAL AORTOGRAM W/LOWER EXTREMITY N/A 02/24/2019   Procedure: ABDOMINAL AORTOGRAM W/LOWER EXTREMITY;  Surgeon: Runell Gess, MD;  Location: [facility] INVASIVE CV LAB;  Service: Cardiovascular;  Laterality: N/A;   CORONARY ANGIOPLASTY WITH STENT PLACEMENT      Dr. Dareen Piano in [place], [place].   FOOT SURGERY     PERIPHERAL VASCULAR INTERVENTION Left 02/24/2019   Procedure: PERIPHERAL VASCULAR INTERVENTION;  Surgeon: Runell Gess, MD;  Location: [facility] INVASIVE CV LAB;  Service: Cardiovascular;  Laterality: Left;   ROTATOR CUFF REPAIR      Social History   Socioeconomic History   Marital status: Divorced    Spouse name: Not on file   Number of children: Not on file   Years of education: 12th grade   Highest education level: Not on file  Occupational History    Comment: Retired  Tobacco Use   Smoking status: Former    Current packs/day: 0.00    Average packs/day: 1.8 packs/day for 59.0 years (103.3 ttl pk-yrs)    Types: Cigarettes    Start date: 07/18/1961    Quit date: 07/18/2020    Years since quitting: 2.6   Smokeless tobacco: Never  Vaping Use   Vaping status: Never Used  Substance and Sexual Activity   Alcohol use: Not Currently   Drug use: No   Sexual activity: Not on file  Other Topics Concern   Not on file  Social History Narrative   Lives alone. He has 12 grown children. He enjoys going to the race track and the casino.   Social Drivers of Health   Financial Resource Strain: Medium Risk (08/21/2020)   Overall Financial Resource Strain (CARDIA)    Difficulty of Paying Living Expenses: Somewhat hard  Food Insecurity: No Food Insecurity (08/23/2022)   Received from [facility], [facility]   Hunger Vital Sign    Worried About Running Out of Food in the Last Year: Never true    Ran Out of Food in the Last Year: Never true  Transportation Needs: No Transportation Needs (08/23/2022)   Received from [facility], [facility]   PRAPARE - Transportation    Lack of Transportation (Medical): No    Lack of Transportation (Non-Medical): No  Physical Activity: Insufficiently Active (08/21/2020)   Exercise Vital Sign    Days of Exercise per Week: 3 days    Minutes of Exercise per Session: 30 min  Stress: No Stress Concern Present  (08/23/2022)   Received from [facility], [facility] of Occupational Health - Occupational Stress Questionnaire    Feeling of Stress : Only a little  Social Connections: Unknown (06/17/2021)   Received from [facility], [facility]   Social Network    Social Network: Not on file    Family History  Problem Relation Age of Onset   Cancer - Cervical Mother    Colon cancer Mother    Colon cancer Father    Prostate cancer Father    Diabetes Maternal Grandmother     Health Maintenance  Topic Date Due   COVID-19 Vaccine (1) Never done   Hepatitis C Screening  Never done   Zoster Vaccines- Shingrix (1 of 2) Never done   DTaP/Tdap/Td (3 - Td or Tdap) 01/07/2020  Medicare Annual Wellness (AWV)  08/21/2021   INFLUENZA VACCINE  Never done   Pneumonia Vaccine 71+ Years old (2 of 2 - PPSV23 or PCV20) 09/01/2023 (Originally 04/24/2016)   Lung Cancer Screening  09/15/2023   HPV VACCINES  Aged Out   Fecal DNA (Cologuard)  Discontinued     ----------------------------------------------------------------------------------------------------------------------------------------------------------------------------------------------------------------- Physical Exam BP (!) 169/88   Pulse 77   Ht 5\' 5"  (1.651 m)   Wt 150 lb (68 kg)   SpO2 99%   BMI 24.96 kg/m   Physical Exam Constitutional:      Appearance: Normal appearance.  HENT:     Head: Normocephalic and atraumatic.  Cardiovascular:     Rate and Rhythm: Normal rate and regular rhythm.  Pulmonary:     Effort: Pulmonary effort is normal.     Breath sounds: Normal breath sounds.  Musculoskeletal:     Cervical back: Neck supple.  Neurological:     Mental Status: He is alert.  Psychiatric:        Mood and Affect: Mood normal.        Behavior: Behavior normal.      ------------------------------------------------------------------------------------------------------------------------------------------------------------------------------------------------------------------- Assessment and Plan  Tracheostomy dependent [facility]) Seen by ENT concerned about foreign body within tracheostomy.  Let her know that his son has been contacted a couple of times to try to schedule appointment and I encouraged him to recontact ENT clinic.  Lung mass He needs to have biopsy of mass to determine what is going on at this.  I discussed him that this would be the only way to definitively know if this is cancer or not.  I would defer to surgeon regarding risk of procedure.   No orders of the defined types were placed in this encounter.   No follow-ups on file.    This visit occurred during the SARS-CoV-2 public health emergency.  Safety protocols were in place, including screening questions prior to the visit, additional usage of staff PPE, and extensive cleaning of exam room while observing appropriate contact time as indicated for disinfecting solutions.

## 2023-03-15 NOTE — Assessment & Plan Note (Signed)
Seen by ENT concerned about foreign body within tracheostomy.  Let her know that his son has been contacted a couple of times to try to schedule appointment and I encouraged him to recontact ENT clinic.

## 2023-03-15 NOTE — Assessment & Plan Note (Signed)
He needs to have biopsy of mass to determine what is going on at this.  I discussed him that this would be the only way to definitively know if this is cancer or not.  I would defer to surgeon regarding risk of procedure.

## 2023-06-03 DIAGNOSIS — K222 Esophageal obstruction: Secondary | ICD-10-CM | POA: Insufficient documentation

## 2023-06-18 ENCOUNTER — Ambulatory Visit

## 2023-06-18 ENCOUNTER — Ambulatory Visit: Admitting: Family Medicine

## 2023-06-18 ENCOUNTER — Encounter: Payer: Self-pay | Admitting: Family Medicine

## 2023-06-18 VITALS — BP 166/91 | HR 76 | Ht 65.0 in | Wt 151.0 lb

## 2023-06-18 DIAGNOSIS — J441 Chronic obstructive pulmonary disease with (acute) exacerbation: Secondary | ICD-10-CM | POA: Diagnosis not present

## 2023-06-18 MED ORDER — DOXYCYCLINE HYCLATE 100 MG PO TABS: 100.0000 mg | ORAL_TABLET | Freq: Two times a day (BID) | ORAL | 0 refills | Status: DC

## 2023-06-18 MED ORDER — CETIRIZINE HCL 10 MG PO TABS: 10.0000 mg | ORAL_TABLET | Freq: Every day | ORAL | 3 refills | Status: AC

## 2023-06-18 MED ORDER — PREDNISONE 20 MG PO TABS: 20.0000 mg | ORAL_TABLET | Freq: Two times a day (BID) | ORAL | 0 refills | Status: AC

## 2023-06-18 NOTE — Progress Notes (Signed)
 Shane Carrillo - 78 y.o. male MRN [id number]  Date of birth: 08-19-1945  Subjective Chief Complaint  Patient presents with   Sore Throat    HPI Shane Carrillo is a 78 year old male here today with complaint of sore throat, increased wheezing, shortness of breath and mucus production.  He is status post laryngectomy with tracheostomy.  Tracheostomy hygiene has not been the best since he has had this.  He reports that recently he has developed more mucus production, throat pain and wheezing.  He does feel more short of breath.  He denies fever or chills.  He does admit to coughing forcefully enough that he has a little bit of blood streaked in with his mucus.  He was using DuoNeb as needed.  ROS:  A comprehensive ROS was completed and negative except as noted per HPI  Allergies  Allergen Reactions   Atorvastatin  Itching   Sildenafil  Palpitations   Simvastatin Itching   Aspirin  Other (See Comments)    GI upset - Pt states that he is able to take the coated form     Past Medical History:  Diagnosis Date   Abdominal aortic aneurysm (AAA) 3.0 cm to 5.5 cm in diameter in male [facility]) 08/28/2016   Formatting of this note might be different from the original.  08/28/2016 AAA screening US  with 2.9 cm enlargement, repeat in 5 yrs, pt encouraged to stop smoking & to keep HTN controlled  Formatting of this note might be different from the original.  Overview:   08/28/2016 AAA screening US  with 2.9 cm enlargement, repeat in 5 yrs, pt encouraged to stop smoking & to keep HTN controlled     Abdominal aortic ectasia (HCC) 08/28/2016   Formatting of this note might be different from the original. Overview: 08/28/2016 AAA screening US  with 2.9 cm enlargement, repeat in 5 yrs, pt encouraged to stop smoking & to keep HTN controlled     Abnormal TSH 11/02/2018   Acute coronary syndrome (HCC) 10/09/2018   Acute pain of right knee 01/02/2020   Adjustment disorder with depressed mood 03/26/2021   Allergic  rhinitis 06/03/2019   Benign prostatic hyperplasia with urinary frequency 11/02/2018   Benign prostatic hyperplasia with weak urinary stream 02/28/2016   BPH with obstruction/lower urinary tract symptoms    Breast lesion 08/20/2022   CAD (coronary artery disease) 11/02/2018   Cigarette smoker 02/26/2018   Community acquired pneumonia 06/03/2021   COPD (chronic obstructive pulmonary disease) (HCC)    Depression, major, single episode, moderate (HCC) 08/20/2022   DOE (dyspnea on exertion)    spirometry 02/2018 normal, but needs full PFT's per Dr. Waymond Hailey 03/2018.  No bronchodilators indicated as of 03/2018.   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   Mass of skin of back 08/20/2022   Mid back pain 02/01/2019   Nicotine dependence, cigarettes, uncomplicated 02/28/2016   PAD (peripheral artery disease) (HCC)    Abd aortic athero   Peripheral arterial disease (HCC) 01/26/2019   Peripheral arterial disease     Radiculopathy of cervicothoracic region 09/13/2021   Swelling of lower leg 12/26/2021  Syncope 09/01/2022   Syncope and collapse 08/23/2022   Thoracic aortic aneurysm (HCC) 12/31/2021   Tracheostomy dependent (HCC) 09/13/2021    Past Surgical History:  Procedure Laterality Date   ABDOMINAL AORTOGRAM W/LOWER EXTREMITY N/A 02/24/2019   Procedure: ABDOMINAL AORTOGRAM W/LOWER EXTREMITY;  Surgeon: Avanell Leigh, MD;  Location: [facility] INVASIVE CV LAB;  Service: Cardiovascular;  Laterality: N/A;   CORONARY ANGIOPLASTY WITH STENT PLACEMENT     Dr. Alva Jewels in [place],  [place].   FOOT SURGERY     PERIPHERAL VASCULAR INTERVENTION Left 02/24/2019   Procedure: PERIPHERAL VASCULAR INTERVENTION;  Surgeon: Avanell Leigh, MD;  [facility]: [facility] INVASIVE CV LAB;  Service: Cardiovascular;  Laterality: Left;   ROTATOR CUFF REPAIR      Social History   Socioeconomic History   Marital status: Divorced    Spouse name: Not on file   Number of children: Not on file   Years of education: 12th grade   Highest education level: Not on file  Occupational History    Comment: Retired  Tobacco Use   Smoking status: Former    Current packs/day: 0.00    Average packs/day: 1.8 packs/day for 59.0 years (103.3 ttl pk-yrs)    Types: Cigarettes    Start date: 07/18/1961    Quit date: 07/18/2020    Years since quitting: 2.9   Smokeless tobacco: Never  Vaping Use   Vaping status: Never Used  Substance and Sexual Activity   Alcohol use: Not Currently   Drug use: No   Sexual activity: Not on file  Other Topics Concern   Not on file  Social History Narrative   Lives alone. He has 12 grown children. He enjoys going to the race track and the casino.   Social Drivers of Health   Financial Resource Strain: Medium Risk (08/21/2020)   Overall Financial Resource Strain (CARDIA)    Difficulty of Paying Living Expenses: Somewhat hard  Food Insecurity: Food Insecurity Present (06/03/2023)   Received from [facility]   Hunger Vital Sign    Worried About Running Out of Food in the Last Year: Sometimes true    Ran Out of Food in the Last Year: Sometimes true  Transportation Needs: Unknown (06/03/2023)   Received from [facility]   PRAPARE - Transportation    Lack of Transportation (Medical): Patient declined    Lack of Transportation (Non-Medical): No  Physical Activity: Insufficiently Active (08/21/2020)   Exercise Vital Sign    Days of Exercise per Week: 3 days    Minutes of Exercise per Session: 30 min  Stress: No Stress Concern Present (08/23/2022)   Received from [facility],  [facility] of Occupational Health - Occupational Stress Questionnaire    Feeling of Stress : Only a little  Social Connections: Unknown (06/17/2021)   Received from [facility], [facility]   Social Network    Social Network: Not on file    Family History  Problem Relation Age of Onset   Cancer - Cervical Mother    Colon cancer Mother    Colon cancer Father    Prostate cancer Father    Diabetes Maternal Grandmother     Health Maintenance  Topic Date Due   COVID-19 Vaccine (1) Never done   Hepatitis C Screening  Never done   Zoster Vaccines- Shingrix (1 of 2) Never done   DTaP/Tdap/Td (3 - Td or Tdap) 01/07/2020   Medicare Annual Wellness (AWV)  08/21/2021   Pneumonia Vaccine  97+ Years old (2 of 2 - PPSV23) 09/01/2023 (Originally 04/24/2016)   Lung Cancer Screening  09/15/2023   INFLUENZA VACCINE  09/18/2023   HPV VACCINES  Aged Out   Meningococcal B Vaccine  Aged Out   Fecal DNA (Cologuard)  Discontinued     ----------------------------------------------------------------------------------------------------------------------------------------------------------------------------------------------------------------- Physical Exam BP (!) 166/91   Pulse 76   Ht 5\' 5"  (1.651 m)   Wt 151 lb (68.5 kg)   SpO2 99%   BMI 25.13 kg/m   Physical Exam Constitutional:      Appearance: He is well-developed.  Eyes:     General: No scleral icterus. Cardiovascular:     Rate and Rhythm: Normal rate and regular rhythm.  Pulmonary:     Effort: Pulmonary effort is normal.     Breath sounds: Wheezing present.  Musculoskeletal:     Cervical back: Neck supple.  Neurological:     Mental Status: He is alert.  Psychiatric:        Mood and Affect: Mood normal.        Behavior: Behavior normal.      ------------------------------------------------------------------------------------------------------------------------------------------------------------------------------------------------------------------- Assessment and Plan  COPD exacerbation (HCC) Will go ahead and start doxycycline  with prednisone  burst.  2 view chest x-ray ordered.  Reinforced hygiene of tracheostomy.   Meds ordered this encounter  Medications   predniSONE  (DELTASONE ) 20 MG tablet    Sig: Take 1 tablet (20 mg total) by mouth 2 (two) times daily with a meal for 5 days.    Dispense:  10 tablet    Refill:  0   doxycycline  (VIBRA -TABS) 100 MG tablet    Sig: Take 1 tablet (100 mg total) by mouth 2 (two) times daily.    Dispense:  20 tablet    Refill:  0   cetirizine  (ZYRTEC ) 10 MG tablet    Sig: Take 1 tablet (10 mg total) by mouth daily.    Dispense:  90 tablet    Refill:  3    No follow-ups on file.

## 2023-06-18 NOTE — Assessment & Plan Note (Signed)
 Will go ahead and start doxycycline  with prednisone  burst.  2 view chest x-ray ordered.  Reinforced hygiene of tracheostomy.

## 2023-06-19 ENCOUNTER — Other Ambulatory Visit: Payer: Self-pay | Admitting: Family Medicine

## 2023-06-19 MED ORDER — CEFDINIR 300 MG PO CAPS: 300.0000 mg | ORAL_CAPSULE | Freq: Two times a day (BID) | ORAL | 0 refills | Status: AC

## 2023-06-23 ENCOUNTER — Ambulatory Visit: Admitting: Family Medicine

## 2023-06-23 ENCOUNTER — Other Ambulatory Visit: Payer: Self-pay | Admitting: Family Medicine

## 2023-06-23 NOTE — Telephone Encounter (Signed)
 Pt requesting d/c'd medication.

## 2023-06-23 NOTE — Telephone Encounter (Signed)
 Prescription Request  06/23/2023  LOV: 06/18/2023  What is the name of the medication or equipment? tamsulosin  (FLOMAX ) 0.4 MG CAPS capsule   Have you contacted your pharmacy to request a refill? Yes   Which pharmacy would you like this sent to?  [facility] - [place], [place] - [address] [place] [address] Phone: [phone] Fax: [phone]    Patient notified that their request is being sent to the clinical staff for review and that they should receive a response within 2 business days.   Please advise at [facility] [phone]

## 2023-06-24 MED ORDER — TAMSULOSIN HCL 0.4 MG PO CAPS: 0.4000 mg | ORAL_CAPSULE | Freq: Every day | ORAL | 3 refills | Status: DC

## 2023-06-25 ENCOUNTER — Ambulatory Visit

## 2023-06-25 ENCOUNTER — Encounter: Payer: Self-pay | Admitting: Family Medicine

## 2023-06-25 ENCOUNTER — Ambulatory Visit: Admitting: Family Medicine

## 2023-06-25 VITALS — BP 159/84 | HR 75 | Ht 65.0 in | Wt 147.0 lb

## 2023-06-25 DIAGNOSIS — J441 Chronic obstructive pulmonary disease with (acute) exacerbation: Secondary | ICD-10-CM

## 2023-06-25 DIAGNOSIS — N401 Enlarged prostate with lower urinary tract symptoms: Secondary | ICD-10-CM

## 2023-06-25 DIAGNOSIS — M5441 Lumbago with sciatica, right side: Secondary | ICD-10-CM

## 2023-06-25 DIAGNOSIS — M545 Low back pain, unspecified: Secondary | ICD-10-CM | POA: Diagnosis not present

## 2023-06-25 DIAGNOSIS — M5442 Lumbago with sciatica, left side: Secondary | ICD-10-CM

## 2023-06-25 DIAGNOSIS — G8929 Other chronic pain: Secondary | ICD-10-CM | POA: Diagnosis not present

## 2023-06-25 DIAGNOSIS — R3915 Urgency of urination: Secondary | ICD-10-CM | POA: Diagnosis not present

## 2023-06-25 DIAGNOSIS — N138 Other obstructive and reflux uropathy: Secondary | ICD-10-CM

## 2023-06-25 LAB — POCT URINALYSIS DIP (CLINITEK)
Blood, UA: NEGATIVE
Glucose, UA: NEGATIVE mg/dL
Leukocytes, UA: NEGATIVE
Nitrite, UA: NEGATIVE
POC PROTEIN,UA: 30 — AB
Spec Grav, UA: >=1.03 — AB (ref 1.010–1.025)
Urobilinogen, UA: 0.2 U/dL
pH, UA: 5.5 (ref 5.0–8.0)

## 2023-06-25 NOTE — Patient Instructions (Addendum)
 Take Cefdinir and doxycycline  until all gone. -antibiotics Take prednisone  -Steroid Have xray completed.

## 2023-06-26 LAB — PSA: Prostate Specific Ag, Serum: 3.2 ng/mL (ref 0.0–4.0)

## 2023-06-27 LAB — URINE CULTURE

## 2023-06-28 DIAGNOSIS — G8929 Other chronic pain: Secondary | ICD-10-CM | POA: Insufficient documentation

## 2023-06-28 NOTE — Progress Notes (Signed)
 Shane Carrillo - 78 y.o. male MRN [id number]  Date of birth: 09-23-1945  Subjective Chief Complaint  Patient presents with   Leg Pain   Urinary Tract Infection    HPI Shane Carrillo is a 78 year old male here today for follow-up visit.  He has recently picked up antibiotics for treatment of lower respiratory infection/pneumonia.  Continues to complain of increased sputum production.  Has had some wheezing and dyspnea.  Denies fever or chills.  He does have upcoming surgery for revision of his tracheostomy.  He reports urinary frequency as well as urgency.  Denies pain with urination.  He was on tamsulosin  for this but has been out for a while.  Recent prescription was sent in for him.  He has not noted any blood in his urine.  He has pain in bilateral lower extremities.  This is worse with walking.  He does have pain in his lower back as well.  Normal ABIs in 2023 when he had similar symptoms.  He has not had recent imaging of his lumbar spine.  He does describe tingling sensation at times.  Denies weakness in lower extremities.  ROS:  A comprehensive ROS was completed and negative except as noted per HPI  Allergies  Allergen Reactions   Atorvastatin  Itching   Sildenafil  Palpitations   Simvastatin Itching   Aspirin  Other (See Comments)    GI upset - Pt states that he is able to take the coated form     Past Medical History:  Diagnosis Date   Abdominal aortic aneurysm (AAA) 3.0 cm to 5.5 cm in diameter in male [facility]) 08/28/2016   Formatting of this note might be different from the original.  08/28/2016 AAA screening US  with 2.9 cm enlargement, repeat in 5 yrs, pt encouraged to stop smoking & to keep HTN controlled  Formatting of this note might be different from the original.  Overview:   08/28/2016 AAA screening US  with 2.9 cm enlargement, repeat in 5 yrs, pt encouraged to stop smoking & to keep HTN controlled     Abdominal aortic ectasia (HCC) 08/28/2016   Formatting of this note  might be different from the original. Overview: 08/28/2016 AAA screening US  with 2.9 cm enlargement, repeat in 5 yrs, pt encouraged to stop smoking & to keep HTN controlled     Abnormal TSH 11/02/2018   Acute coronary syndrome (HCC) 10/09/2018   Acute pain of right knee 01/02/2020   Adjustment disorder with depressed mood 03/26/2021   Allergic rhinitis 06/03/2019   Benign prostatic hyperplasia with urinary frequency 11/02/2018   Benign prostatic hyperplasia with weak urinary stream 02/28/2016   BPH with obstruction/lower urinary tract symptoms    Breast lesion 08/20/2022   CAD (coronary artery disease) 11/02/2018   Cigarette smoker 02/26/2018   Community acquired pneumonia 06/03/2021   COPD (chronic obstructive pulmonary disease) (HCC)    Depression, major, single episode, moderate (HCC) 08/20/2022   DOE (dyspnea on exertion)    spirometry 02/2018 normal, but needs full PFT's per Dr. Waymond Hailey 03/2018.  No bronchodilators indicated as of 03/2018.   Dysphagia 08/12/2016   Elevated blood pressure reading 12/26/2021   Erectile dysfunction 07/30/2009   Essential hypertension 11/02/2018   Excessive daytime sleepiness 01/14/2017   GERD (gastroesophageal reflux disease)    Dysphagia   Hypercholesterolemia    Intol of atorva and simva   Hypertension    Inflamed external hemorrhoid 11/23/2018   Iron deficiency anemia 06/12/2021   Laryngeal mass 12/20/2020   Malignant neoplasm  of supraglottis (HCC) 12/25/2020   Mass of skin of back 08/20/2022   Mid back pain 02/01/2019   Nicotine dependence, cigarettes, uncomplicated 02/28/2016   PAD (peripheral artery disease) (HCC)    Abd aortic athero   Peripheral arterial disease (HCC) 01/26/2019   Peripheral arterial disease     Radiculopathy of cervicothoracic region 09/13/2021   Swelling of lower leg 12/26/2021   Syncope 09/01/2022   Syncope and collapse 08/23/2022   Thoracic aortic aneurysm (HCC) 12/31/2021   Tracheostomy dependent (HCC) 09/13/2021     Past Surgical History:  Procedure Laterality Date   ABDOMINAL AORTOGRAM W/LOWER EXTREMITY N/A 02/24/2019   Procedure: ABDOMINAL AORTOGRAM W/LOWER EXTREMITY;  Surgeon: Avanell Leigh, MD;  Location: [facility] INVASIVE CV LAB;  Service: Cardiovascular;  Laterality: N/A;   CORONARY ANGIOPLASTY WITH STENT PLACEMENT     Dr. Alva Jewels in [place], [place].   FOOT SURGERY     PERIPHERAL VASCULAR INTERVENTION Left 02/24/2019   Procedure: PERIPHERAL VASCULAR INTERVENTION;  Surgeon: Avanell Leigh, MD;  Location: [facility] INVASIVE CV LAB;  Service: Cardiovascular;  Laterality: Left;   ROTATOR CUFF REPAIR      Social History   Socioeconomic History   Marital status: Divorced    Spouse name: Not on file   Number of children: Not on file   Years of education: 12th grade   Highest education level: Not on file  Occupational History    Comment: Retired  Tobacco Use   Smoking status: Former    Current packs/day: 0.00    Average packs/day: 1.8 packs/day for 59.0 years (103.3 ttl pk-yrs)    Types: Cigarettes    Start date: 07/18/1961    Quit date: 07/18/2020    Years since quitting: 2.9   Smokeless tobacco: Never  Vaping Use   Vaping status: Never Used  Substance and Sexual Activity   Alcohol use: Not Currently   Drug use: No   Sexual activity: Not on file  Other Topics Concern   Not on file  Social History Narrative   Lives alone. He has 12 grown children. He enjoys going to the race track and the casino.   Social Drivers of Health   Financial Resource Strain: Medium Risk (08/21/2020)   Overall Financial Resource Strain (CARDIA)    Difficulty of Paying Living Expenses: Somewhat hard  Food Insecurity: Food Insecurity Present (06/03/2023)   Received from [facility]   Hunger Vital Sign    Worried About Running Out of Food in the Last Year: Sometimes true    Ran Out of Food in the Last Year: Sometimes true  Transportation Needs: Unknown (06/03/2023)   Received from [facility]   PRAPARE -  Transportation    Lack of Transportation (Medical): Patient declined    Lack of Transportation (Non-Medical): No  Physical Activity: Insufficiently Active (08/21/2020)   Exercise Vital Sign    Days of Exercise per Week: 3 days    Minutes of Exercise per Session: 30 min  Stress: No Stress Concern Present (08/23/2022)   Received from [facility], [facility] of Occupational Health - Occupational Stress Questionnaire    Feeling of Stress : Only a little  Social Connections: Unknown (06/17/2021)   Received from [facility], [facility]   Social Network    Social Network: Not on file    Family History  Problem Relation Age of Onset   Cancer - Cervical Mother    Colon cancer Mother    Colon cancer Father  Prostate cancer Father    Diabetes Maternal Grandmother     Health Maintenance  Topic Date Due   COVID-19 Vaccine (1) Never done   Hepatitis C Screening  Never done   Zoster Vaccines- Shingrix (1 of 2) Never done   DTaP/Tdap/Td (3 - Td or Tdap) 01/07/2020   Medicare Annual Wellness (AWV)  08/21/2021   Pneumonia Vaccine 69+ Years old (2 of 2 - PPSV23) 09/01/2023 (Originally 04/24/2016)   Lung Cancer Screening  09/15/2023   INFLUENZA VACCINE  09/18/2023   HPV VACCINES  Aged Out   Meningococcal B Vaccine  Aged Out   Fecal DNA (Cologuard)  Discontinued     ----------------------------------------------------------------------------------------------------------------------------------------------------------------------------------------------------------------- Physical Exam BP (!) 159/84 (BP Location: Left Arm, Patient Position: Sitting, Cuff Size: Normal)   Pulse 75   Ht 5\' 5"  (1.651 m)   Wt 147 lb (66.7 kg)   SpO2 100%   BMI 24.46 kg/m   Physical Exam Constitutional:      Appearance: Normal appearance.  Eyes:     General: No scleral icterus. Cardiovascular:     Rate and Rhythm: Normal rate and regular rhythm.     Pulses: Normal pulses.      Comments: 1+ dorsalis pedis and posterior tibial pulses bilaterally. Pulmonary:     Effort: Pulmonary effort is normal.     Breath sounds: Normal breath sounds.  Musculoskeletal:     Cervical back: Neck supple.  Neurological:     Mental Status: He is alert.  Psychiatric:        Mood and Affect: Mood normal.        Behavior: Behavior normal.     ------------------------------------------------------------------------------------------------------------------------------------------------------------------------------------------------------------------- Assessment and Plan  Chronic bilateral low back pain with bilateral sciatica He is having some intermittent claudication symptoms likely neurogenic in nature.  Updated x-rays of lumbar spine ordered.  BPH with obstruction/lower urinary tract symptoms Having increased lower urinary tract symptoms.  Urinalysis and culture ordered.  He will restart tamsulosin  as well.  COPD exacerbation (HCC) He just picked up antibiotics yesterday.  Encouraged him to take these until completion.  Red flags reviewed.  Contact clinic if worsening.   No orders of the defined types were placed in this encounter.   No follow-ups on file.

## 2023-06-28 NOTE — Assessment & Plan Note (Signed)
 He just picked up antibiotics yesterday.  Encouraged him to take these until completion.  Red flags reviewed.  Contact clinic if worsening.

## 2023-06-28 NOTE — Assessment & Plan Note (Signed)
 Having increased lower urinary tract symptoms.  Urinalysis and culture ordered.  He will restart tamsulosin  as well.

## 2023-06-28 NOTE — Assessment & Plan Note (Signed)
 He is having some intermittent claudication symptoms likely neurogenic in nature.  Updated x-rays of lumbar spine ordered.

## 2023-06-30 ENCOUNTER — Ambulatory Visit: Payer: Self-pay

## 2023-07-02 ENCOUNTER — Ambulatory Visit

## 2023-07-02 VITALS — BP 129/73 | HR 91 | Ht 65.0 in | Wt 152.0 lb

## 2023-07-02 DIAGNOSIS — Z0000 Encounter for general adult medical examination without abnormal findings: Secondary | ICD-10-CM

## 2023-07-02 DIAGNOSIS — Z0100 Encounter for examination of eyes and vision without abnormal findings: Secondary | ICD-10-CM

## 2023-07-02 NOTE — Patient Instructions (Signed)
  Mr. Shane Carrillo , Thank you for taking time to come for your Medicare Wellness Visit. I appreciate your ongoing commitment to your health goals. Please review the following plan we discussed and let me know if I can assist you in the future.   These are the goals we discussed:  Goals       Patient Stated (pt-stated)      08/21/2020 AWV Goal: Tobacco Cessation  Smoking cessation instruction/counseling given:  commended patient for quitting and reviewed strategies for preventing relapses  Patient will verbalize understanding of the health risks associated with smoking/tobacco use Lung cancer or lung disease, such as COPD Heart disease. Stroke. Heart attack Infertility Osteoporosis and bone fractures. Patient will create a plan to quit smoking/using tobacco Pick a date to quit.  Write down the reasons why you are quitting and put it where you will see it often. Identify the people, places, things, and activities that make you want to smoke (triggers) and avoid them. Make sure to take these actions: Throw away all cigarettes at home, at work, and in your car. Throw away smoking accessories, such as Set designer. Clean your car and make sure to empty the ashtray. Clean your home, including curtains and carpets. Tell your family, friends, and coworkers that you are quitting. Support from your loved ones can make quitting easier. Talk with your health care provider about your options for quitting smoking. Find out what treatment options are covered by your health insurance. Patient will be able to demonstrate knowledge of tobacco cessation strategies that may maximize success Quitting "cold [place]" is more successful than gradually quitting. Attending in-person counseling to help you build problem-solving skills.  Finding resources and support systems that can help you to quit smoking and remain smoke-free after you quit. These resources are most helpful when you use them often. They  can include: Online chats with a Veterinary surgeon. Telephone quitlines. Printed Materials engineer. Support groups or group counseling. Text messaging programs. Mobile phone applications. Taking medicines to help you quit smoking: Nicotine patches, gum, or lozenges. Nicotine inhalers or sprays. Non-nicotine medicine that is taken by mouth. Patient will note get discouraged if the process is difficult Over the next year, patient will stop smoking or using other forms of tobacco  Smoking cessation instruction/counseling given:  commended patient for quitting and reviewed strategies for preventing relapses        Patient Stated      Patient stated he would like to be more active.         This is a list of the screening recommended for you and due dates:  Health Maintenance  Topic Date Due   COVID-19 Vaccine (1) Never done   Hepatitis C Screening  Never done   Zoster (Shingles) Vaccine (1 of 2) Never done   DTaP/Tdap/Td vaccine (3 - Td or Tdap) 01/07/2020   Pneumonia Vaccine (2 of 2 - PPSV23) 09/01/2023*   Screening for Lung Cancer  09/15/2023   Flu Shot  09/18/2023   Medicare Annual Wellness Visit  07/01/2024   HPV Vaccine  Aged Out   Meningitis B Vaccine  Aged Out   Cologuard (Stool DNA test)  Discontinued  *Topic was postponed. The date shown is not the original due date.

## 2023-07-02 NOTE — Progress Notes (Signed)
 Subjective:   Shane Carrillo is a 78 y.o. male who presents for Medicare Annual/Subsequent preventive examination.  Visit Complete: In person  Patient Medicare AWV questionnaire was completed by the patient on n/a; I have confirmed that all information answered by patient is correct and no changes since this date.  Cardiac Risk Factors include: advanced age (>54men, >74 women);dyslipidemia;hypertension;family history of premature cardiovascular disease;smoking/ tobacco exposure;male gender     Objective:    Today's Vitals   07/02/23 1405  BP: 129/73  Pulse: 91  SpO2: 97%  Weight: 152 lb (68.9 kg)  Height: 5\' 5"  (1.651 m)   Body mass index is 25.29 kg/m.     07/02/2023    2:12 PM 08/21/2020    2:15 PM 07/29/2020    3:13 AM 07/23/2020   12:36 PM 06/16/2019    7:01 PM 02/24/2019    6:29 AM 11/26/2018   10:17 PM  Advanced Directives  Does Patient Have a Medical Advance Directive? No No Yes No No No No  Would patient like information on creating a medical advance directive? Yes (MAU/Ambulatory/Procedural Areas - Information given) No - Patient declined  No - Patient declined No - Patient declined No - Patient declined No - Patient declined    Current Medications (verified) Outpatient Encounter Medications as of 07/02/2023  Medication Sig   albuterol  (PROAIR  HFA) 108 (90 Base) MCG/ACT inhaler Inhale 2 puffs into the lungs every 6 (six) hours as needed for wheezing or shortness of breath.   amLODipine  (NORVASC ) 10 MG tablet Take 10 mg by mouth daily.   aspirin  EC 325 MG tablet Take 325 mg by mouth daily.   cetirizine  (ZYRTEC ) 10 MG tablet Take 1 tablet (10 mg total) by mouth daily.   clopidogrel  (PLAVIX ) 75 MG tablet Take 75 mg by mouth daily.   ipratropium-albuterol  (DUONEB) 0.5-2.5 (3) MG/3ML SOLN Take 3 mLs by nebulization every 2 (two) hours as needed (wheeze, SOB).   losartan  (COZAAR ) 100 MG tablet Take 1 tablet (100 mg total) by mouth daily.   nitroGLYCERIN  (NITROSTAT ) 0.4 MG  SL tablet Place 0.4 mg under the tongue every 5 (five) minutes as needed for chest pain.   Omega-3 Fatty Acids (FISH OIL ) 1000 MG CAPS Take 1 g by mouth daily.   pantoprazole  (PROTONIX ) 40 MG tablet Take 1 tablet (40 mg total) by mouth daily.   sildenafil  (VIAGRA ) 100 MG tablet TAKE ONE TABLET BY MOUTH DAILY AS NEEDED FOR ERECTILE DYSFUNCTION.   tamsulosin  (FLOMAX ) 0.4 MG CAPS capsule Take 1 capsule (0.4 mg total) by mouth daily.   escitalopram  (LEXAPRO ) 10 MG tablet  (Patient not taking: Reported on 07/02/2023)   lidocaine  (XYLOCAINE ) 2 % jelly Apply topically tid (plz sched with new provider for future fills) (Patient not taking: Reported on 07/02/2023)   mirtazapine  (REMERON ) 15 MG tablet Take 1 tablet (15 mg total) by mouth at bedtime. (Patient not taking: Reported on 07/02/2023)   [DISCONTINUED] doxycycline  (VIBRA -TABS) 100 MG tablet Take 1 tablet (100 mg total) by mouth 2 (two) times daily.   No facility-administered encounter medications on file as of 07/02/2023.    Allergies (verified) Atorvastatin , Sildenafil , Simvastatin, and Aspirin    History: Past Medical History:  Diagnosis Date   Abdominal aortic aneurysm (AAA) 3.0 cm to 5.5 cm in diameter in male [facility]) 08/28/2016   Formatting of this note might be different from the original.  08/28/2016 AAA screening US  with 2.9 cm enlargement, repeat in 5 yrs, pt encouraged to stop smoking & to  keep HTN controlled  Formatting of this note might be different from the original.  Overview:   08/28/2016 AAA screening US  with 2.9 cm enlargement, repeat in 5 yrs, pt encouraged to stop smoking & to keep HTN controlled     Abdominal aortic ectasia (HCC) 08/28/2016   Formatting of this note might be different from the original. Overview: 08/28/2016 AAA screening US  with 2.9 cm enlargement, repeat in 5 yrs, pt encouraged to stop smoking & to keep HTN controlled     Abnormal TSH 11/02/2018   Acute coronary syndrome (HCC) 10/09/2018   Acute pain of right knee  01/02/2020   Adjustment disorder with depressed mood 03/26/2021   Allergic rhinitis 06/03/2019   Benign prostatic hyperplasia with urinary frequency 11/02/2018   Benign prostatic hyperplasia with weak urinary stream 02/28/2016   BPH with obstruction/lower urinary tract symptoms    Breast lesion 08/20/2022   CAD (coronary artery disease) 11/02/2018   Cigarette smoker 02/26/2018   Community acquired pneumonia 06/03/2021   COPD (chronic obstructive pulmonary disease) (HCC)    Depression, major, single episode, moderate (HCC) 08/20/2022   DOE (dyspnea on exertion)    spirometry 02/2018 normal, but needs full PFT's per Dr. Waymond Hailey 03/2018.  No bronchodilators indicated as of 03/2018.   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   Mass of skin of back 08/20/2022   Mid back pain 02/01/2019   Nicotine dependence, cigarettes, uncomplicated 02/28/2016   PAD (peripheral artery disease) (HCC)    Abd aortic athero   Peripheral arterial disease (HCC) 01/26/2019   Peripheral arterial disease     Radiculopathy of cervicothoracic region 09/13/2021   Swelling of lower leg 12/26/2021   Syncope 09/01/2022   Syncope and collapse 08/23/2022   Thoracic aortic aneurysm (HCC) 12/31/2021   Tracheostomy dependent (HCC) 09/13/2021   Past Surgical History:  Procedure Laterality Date   ABDOMINAL AORTOGRAM W/LOWER EXTREMITY N/A 02/24/2019   Procedure: ABDOMINAL AORTOGRAM W/LOWER EXTREMITY;  Surgeon: Avanell Leigh, MD;  Location: [facility] INVASIVE CV LAB;  Service: Cardiovascular;  Laterality: N/A;    CORONARY ANGIOPLASTY WITH STENT PLACEMENT     Dr. Alva Jewels in [place], [place].   FOOT SURGERY     PERIPHERAL VASCULAR INTERVENTION Left 02/24/2019   Procedure: PERIPHERAL VASCULAR INTERVENTION;  Surgeon: Avanell Leigh, MD;  Location: [facility] INVASIVE CV LAB;  Service: Cardiovascular;  Laterality: Left;   ROTATOR CUFF REPAIR     Family History  Problem Relation Age of Onset   Cancer - Cervical Mother    Colon cancer Mother    Colon cancer Father    Prostate cancer Father    Diabetes Maternal Grandmother    Social History   Socioeconomic History   Marital status: Divorced    Spouse name: Not on file   Number of children: Not on file   Years of education: 12th grade   Highest education level: Not on file  Occupational History    Comment: Retired  Tobacco Use   Smoking status: Former    Current packs/day: 0.00    Average packs/day: 1.8 packs/day for 59.0 years (103.3 ttl  pk-yrs)    Types: Cigarettes    Start date: 07/18/1961    Quit date: 07/18/2020    Years since quitting: 2.9   Smokeless tobacco: Never  Vaping Use   Vaping status: Never Used  Substance and Sexual Activity   Alcohol use: Not Currently   Drug use: No   Sexual activity: Not on file  Other Topics Concern   Not on file  Social History Narrative   Lives alone. He has 12 grown children. He enjoys [place] track and [place].   Social Drivers of Health   Financial Resource Strain: Medium Risk (08/21/2020)   Overall Financial Resource Strain (CARDIA)    Difficulty of Paying Living Expenses: Somewhat hard  Food Insecurity: Food Insecurity Present (06/03/2023)   Received from [facility]   Hunger Vital Sign    Worried About Running Out of Food in the Last Year: Sometimes true    Ran Out of Food in the Last Year: Sometimes true  Transportation Needs: Unknown (06/03/2023)   Received from [facility]   PRAPARE - Transportation    Lack of Transportation (Medical): Patient declined    Lack of  Transportation (Non-Medical): No  Physical Activity: Insufficiently Active (08/21/2020)   Exercise Vital Sign    Days of Exercise per Week: 3 days    Minutes of Exercise per Session: 30 min  Stress: No Stress Concern Present (08/23/2022)   Received from [facility], [facility] Occupational [facility] - Occupational Stress Questionnaire    Feeling of Stress : Only a little  Social Connections: Unknown (06/17/2021)   Received from [facility], [facility]   Social Network    Social Network: Not on file    Tobacco Counseling Counseling given: Not Answered   Clinical Intake:  Pre-visit preparation completed: Yes  Pain : No/denies pain     BMI - recorded: 25.29 Nutritional Status: BMI 25 -29 Overweight Nutritional Risks: None Diabetes: No  How often do you need to have someone help you when you read instructions, pamphlets, or other written materials from your doctor or pharmacy?: 1 - Never What is the last grade level you completed in school?: 12  Interpreter Needed?: No      Activities of Daily Living    07/02/2023    2:07 PM  In your present state of health, do you have any difficulty performing the following activities:  Hearing? 0  Vision? 1  Difficulty concentrating or making decisions? 1  Walking or climbing stairs? 0  Dressing or bathing? 0  Doing errands, shopping? 0  Preparing Food and eating ? N  Using the Toilet? N  In the past six months, have you accidently leaked urine? Y  Do you have problems with loss of bowel control? N  Managing your Medications? N  Managing your Finances? N  Housekeeping or managing your Housekeeping? N    Patient Care Team: Adela Holter, DO as PCP - General (Family Medicine) Merlene Stark, MD as Referring Physician (Gastroenterology) Vickie Grana, MD as Referring Physician (Otolaryngology)  Indicate any recent Medical Services you may have received from other than [facility] providers in  the past year (date may be approximate).     Assessment:   This is a routine wellness examination for Shane Carrillo.  Hearing/Vision screen No results found.   Goals Addressed             This Visit's Progress    Patient Stated  Patient stated he would like to be more active.       Depression Screen    08/20/2022    9:46 AM 08/20/2022    9:32 AM 03/26/2021    8:45 AM 08/21/2020    2:16 PM 06/04/2020   11:27 AM 07/19/2018    3:36 PM  PHQ 2/9 Scores  PHQ - 2 Score 2 4 3  0 0 0  PHQ- 9 Score 8  10       Fall Risk    07/02/2023    2:13 PM 08/20/2022    9:32 AM 11/07/2021   11:32 AM 10/03/2020    3:35 PM 08/21/2020    2:15 PM  Fall Risk   Falls in the past year? 0 0 0 0 1  Number falls in past yr: 0 0 0  0  Injury with Fall? 0 0 0  1  Risk for fall due to : No Fall Risks No Fall Risks No Fall Risks  History of fall(s)  Follow up Falls evaluation completed Falls evaluation completed Falls evaluation completed  Education provided;Falls evaluation completed;Falls prevention discussed    MEDICARE RISK AT HOME: Medicare Risk at Home Any stairs in or around the home?: Yes If so, are there any without handrails?: Yes Home free of loose throw rugs in walkways, pet beds, electrical cords, etc?: Yes Adequate lighting in your home to reduce risk of falls?: Yes Life alert?: No Use of a cane, walker or w/c?: No Grab bars in the bathroom?: No Shower chair or bench in shower?: Yes Elevated toilet seat or a handicapped toilet?: No  TIMED UP AND GO:  Was the test performed?  Yes  Length of time to ambulate 10 feet: 8 sec Gait steady and fast without use of assistive device    Cognitive Function:        07/02/2023    2:14 PM 08/21/2020    2:21 PM  6CIT Screen  What Year? 0 points 0 points  What month? 0 points 0 points  What time? 0 points 0 points  Count back from 20 0 points 0 points  Months in reverse 0 points 0 points  Repeat phrase 0 points 0 points  Total Score 0 points 0  points    Immunizations Immunization History  Administered Date(s) Administered   Pneumococcal Conjugate-13 02/28/2016, 02/28/2016   Tdap 01/06/2010, 01/06/2010    TDAP status: Due, Education has been provided regarding the importance of this vaccine. Advised may receive this vaccine at local pharmacy or Health Dept. Aware to provide a copy of the vaccination record if obtained from local pharmacy or Health Dept. Verbalized acceptance and understanding.  Flu Vaccine status: Declined, Education has been provided regarding the importance of this vaccine but patient still declined. Advised may receive this vaccine at local pharmacy or Health Dept. Aware to provide a copy of the vaccination record if obtained from local pharmacy or Health Dept. Verbalized acceptance and understanding.  Pneumococcal vaccine status: Declined,  Education has been provided regarding the importance of this vaccine but patient still declined. Advised may receive this vaccine at local pharmacy or Health Dept. Aware to provide a copy of the vaccination record if obtained from local pharmacy or Health Dept. Verbalized acceptance and understanding.   Covid-19 vaccine status: Declined, Education has been provided regarding the importance of this vaccine but patient still declined. Advised may receive this vaccine at local pharmacy or Health Dept.or vaccine clinic. Aware to provide a copy of the vaccination record  if obtained from local pharmacy or Health Dept. Verbalized acceptance and understanding.  Qualifies for Shingles Vaccine? Yes   Zostavax completed No   Shingrix Completed?: No.    Education has been provided regarding the importance of this vaccine. Patient has been advised to call insurance company to determine out of pocket expense if they have not yet received this vaccine. Advised may also receive vaccine at local pharmacy or Health Dept. Verbalized acceptance and understanding.  Screening Tests Health  Maintenance  Topic Date Due   COVID-19 Vaccine (1) Never done   Hepatitis C Screening  Never done   Zoster Vaccines- Shingrix (1 of 2) Never done   DTaP/Tdap/Td (3 - Td or Tdap) 01/07/2020   Pneumonia Vaccine 48+ Years old (2 of 2 - PPSV23) 09/01/2023 (Originally 04/24/2016)   Lung Cancer Screening  09/15/2023   INFLUENZA VACCINE  09/18/2023   Medicare Annual Wellness (AWV)  07/01/2024   HPV VACCINES  Aged Out   Meningococcal B Vaccine  Aged Out   Fecal DNA (Cologuard)  Discontinued    Health Maintenance  Health Maintenance Due  Topic Date Due   COVID-19 Vaccine (1) Never done   Hepatitis C Screening  Never done   Zoster Vaccines- Shingrix (1 of 2) Never done   DTaP/Tdap/Td (3 - Td or Tdap) 01/07/2020    Colorectal cancer screening: No longer required.   Lung Cancer Screening: (Low Dose CT Chest recommended if Age 11-80 years, 20 pack-year currently smoking OR have quit w/in 15years.) does qualify.   Lung Cancer Screening Referral: patient already in program  Additional Screening:  Hepatitis C Screening: does not qualify; Completed   Vision Screening: Recommended annual ophthalmology exams for early detection of glaucoma and other disorders of the eye. Is the patient up to date with their annual eye exam?  No  Who is the provider or what is the name of the office in which the patient attends annual eye exams?  If pt is not established with a provider, would they like to be referred to a provider to establish care? Yes .   Dental Screening: Recommended annual dental exams for proper oral hygiene    Community Resource Referral / Chronic Care Management: CRR required this visit?  Yes   CCM required this visit?  No     Plan:     I have personally reviewed and noted the following in the patient's chart:   Medical and social history Use of alcohol, tobacco or illicit drugs  Current medications and supplements including opioid prescriptions. Patient is not currently  taking opioid prescriptions. Functional ability and status Nutritional status Physical activity Advanced directives List of other physicians Hospitalizations # 0, surgeries # 0, and ER # 1 visits in previous 12 months Vitals Screenings to include cognitive, depression, and falls Referrals and appointments  In addition, I have reviewed and discussed with patient certain preventive protocols, quality metrics, and best practice recommendations. A written personalized care plan for preventive services as well as general preventive health recommendations were provided to patient.     Aubrey Leaf, CMA   07/02/2023   After Visit Summary: (In Person-Printed) AVS printed and given to the patient  Nurse Notes:   Shane Carrillo is a 78 y.o. y.o. male patient of Adela Holter, DO who had a Medicare Annual Wellness Visit today. He reports that he is socially active and does interact with friends/family regularly. He is moderately physically active. He enjoys going to the race track.  Referral placed for ophthalmology.

## 2023-07-15 ENCOUNTER — Encounter: Payer: Self-pay | Admitting: Family Medicine

## 2023-07-15 ENCOUNTER — Ambulatory Visit (INDEPENDENT_AMBULATORY_CARE_PROVIDER_SITE_OTHER): Admitting: Family Medicine

## 2023-07-15 VITALS — BP 166/80 | HR 82 | Ht 65.0 in | Wt 149.0 lb

## 2023-07-15 DIAGNOSIS — M5442 Lumbago with sciatica, left side: Secondary | ICD-10-CM

## 2023-07-15 DIAGNOSIS — G8929 Other chronic pain: Secondary | ICD-10-CM

## 2023-07-15 DIAGNOSIS — J180 Bronchopneumonia, unspecified organism: Secondary | ICD-10-CM | POA: Insufficient documentation

## 2023-07-15 MED ORDER — PREDNISONE 20 MG PO TABS: 20.0000 mg | ORAL_TABLET | Freq: Two times a day (BID) | ORAL | 0 refills | Status: AC

## 2023-07-15 MED ORDER — LEVOFLOXACIN 750 MG PO TABS: 750.0000 mg | ORAL_TABLET | Freq: Every day | ORAL | 0 refills | Status: DC

## 2023-07-15 NOTE — Progress Notes (Signed)
 DA AUTHEMENT - 78 y.o. male MRN [id number]  Date of birth: Jun 02, 1945  Subjective Chief Complaint  Patient presents with   Results   Nasal Congestion    HPI Shane Carrillo is a 78 y.o. male here today for follow up of back pain  He continues to have some congestion as well.  Previous chest x-ray with signs of pneumonia.  Some improvement with antibiotics but has not fully resolved.  Still with quite a bit of thick mucus production and dyspnea.    Continues to have some back pain.  X-rays with degenerative and arthritic changes.  He would be willing to try physical therapy.   Allergies  Allergen Reactions   Atorvastatin  Itching   Sildenafil  Palpitations   Simvastatin Itching   Aspirin  Other (See Comments)    GI upset - Pt states that he is able to take the coated form     Past Medical History:  Diagnosis Date   Abdominal aortic aneurysm (AAA) 3.0 cm to 5.5 cm in diameter in male [facility]) 08/28/2016   Formatting of this note might be different from the original.  08/28/2016 AAA screening US  with 2.9 cm enlargement, repeat in 5 yrs, pt encouraged to stop smoking & to keep HTN controlled  Formatting of this note might be different from the original.  Overview:   08/28/2016 AAA screening US  with 2.9 cm enlargement, repeat in 5 yrs, pt encouraged to stop smoking & to keep HTN controlled     Abdominal aortic ectasia (HCC) 08/28/2016   Formatting of this note might be different from the original. Overview: 08/28/2016 AAA screening US  with 2.9 cm enlargement, repeat in 5 yrs, pt encouraged to stop smoking & to keep HTN controlled     Abnormal TSH 11/02/2018   Acute coronary syndrome (HCC) 10/09/2018   Acute pain of right knee 01/02/2020   Adjustment disorder with depressed mood 03/26/2021   Allergic rhinitis 06/03/2019   Benign prostatic hyperplasia with urinary frequency 11/02/2018   Benign prostatic hyperplasia with weak urinary stream 02/28/2016   BPH with obstruction/lower urinary  tract symptoms    Breast lesion 08/20/2022   CAD (coronary artery disease) 11/02/2018   Cigarette smoker 02/26/2018   Community acquired pneumonia 06/03/2021   COPD (chronic obstructive pulmonary disease) (HCC)    Depression, major, single episode, moderate (HCC) 08/20/2022   DOE (dyspnea on exertion)    spirometry 02/2018 normal, but needs full PFT's per Dr. Waymond Hailey 03/2018.  No bronchodilators indicated as of 03/2018.   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   Mass of skin of back 08/20/2022   Mid back pain 02/01/2019   Nicotine dependence, cigarettes, uncomplicated 02/28/2016   PAD (peripheral artery disease) (HCC)    Abd aortic athero   Peripheral arterial disease (HCC) 01/26/2019   Peripheral arterial disease     Radiculopathy of cervicothoracic region 09/13/2021   Swelling of lower leg 12/26/2021   Syncope 09/01/2022   Syncope and collapse 08/23/2022   Thoracic aortic aneurysm (HCC) 12/31/2021   Tracheostomy dependent (HCC) 09/13/2021    Past Surgical History:  Procedure Laterality  Date   ABDOMINAL AORTOGRAM W/LOWER EXTREMITY N/A 02/24/2019   Procedure: ABDOMINAL AORTOGRAM W/LOWER EXTREMITY;  Surgeon: Avanell Leigh, MD;  Location: [facility] INVASIVE CV LAB;  Service: Cardiovascular;  Laterality: N/A;   CORONARY ANGIOPLASTY WITH STENT PLACEMENT     Dr. Alva Jewels in [place], [place].   FOOT SURGERY     PERIPHERAL VASCULAR INTERVENTION Left 02/24/2019   Procedure: PERIPHERAL VASCULAR INTERVENTION;  Surgeon: Avanell Leigh, MD;  Location[facility] INVASIVE CV LAB;  Service:  Cardiovascular;  Laterality: Left;   ROTATOR CUFF REPAIR      Social History   Socioeconomic History   Marital status: Divorced    Spouse name: Not on file   Number of children: Not on file   Years of education: 12th grade   Highest education level: Not on file  Occupational History    Comment: Retired  Tobacco Use   Smoking status: Former    Current packs/day: 0.00    Average packs/day: 1.8 packs/day for 59.0 years (103.3 ttl pk-yrs)    Types: Cigarettes    Start date: 07/18/1961    Quit date: 07/18/2020    Years since quitting: 2.9   Smokeless tobacco: Never  Vaping Use   Vaping status: Never Used  Substance and Sexual Activity   Alcohol use: Not Currently   Drug use: No   Sexual activity: Not on file  Other Topics Concern   Not on file  Social History Narrative   Lives alone. He has 12 grown children. He enjoys going [place] the race track and the casino.   Social Drivers of Health   Financial Resource Strain: Medium Risk (08/21/2020)   Overall Financial Resource Strain (CARDIA)    Difficulty of Paying Living Expenses: Somewhat hard  Food Insecurity: Food Insecurity Present (06/03/2023)   Received from [facility]   Hunger Vital Sign    Worried About Running Out of Food in the Last Year: Sometimes true    Ran Out of Food in the Last Year: Sometimes true  Transportation Needs: Unknown (06/03/2023)   Received from [facility]   PRAPARE - Transportation    Lack of Transportation (Medical): Patient declined    Lack of Transportation (Non-Medical): No  Physical Activity: Insufficiently Active (08/21/2020)   Exercise Vital Sign    Days of Exercise per Week: 3 days    Minutes of Exercise per Session: 30 min  Stress: No Stress Concern Present (08/23/2022)   Received from [facility] - Occupational Stress Questionnaire    Feeling of Stress : Only a little  Social Connections: Unknown (06/17/2021)   Received from  [facility]   Social Network    Social Network: Not on file    Family History  Problem Relation Age of Onset   Cancer - Cervical Mother    Colon cancer Mother    Colon cancer Father    Prostate cancer Father    Diabetes Maternal Grandmother     Health Maintenance  Topic Date Due   COVID-19 Vaccine (1) Never done   Hepatitis C Screening  Never done   Zoster Vaccines- Shingrix (1 of 2) Never done   DTaP/Tdap/Td (3 - Td or Tdap) 01/07/2020   Pneumonia Vaccine 54+ Years old (2 of 2 - PPSV23) 09/01/2023 (Originally 04/24/2016)   Lung Cancer Screening  09/15/2023   INFLUENZA VACCINE  09/18/2023   Medicare Annual Wellness (AWV)  07/01/2024   HPV VACCINES  Aged  Out   Meningococcal B Vaccine  Aged Out   Fecal DNA (Cologuard)  Discontinued     ----------------------------------------------------------------------------------------------------------------------------------------------------------------------------------------------------------------- Physical Exam BP (!) 166/80 (BP Location: Left Arm, Patient Position: Sitting, Cuff Size: Normal)   Pulse 82   Ht 5\' 5"  (1.651 m)   Wt 149 lb (67.6 kg)   SpO2 98%   BMI 24.79 kg/m   Physical Exam Constitutional:      Appearance: Normal appearance.  HENT:     Head: Normocephalic and atraumatic.  Cardiovascular:     Rate and Rhythm: Normal rate and regular rhythm.  Pulmonary:     Effort: Pulmonary effort is normal.     Breath sounds: Rhonchi present.  Musculoskeletal:     Cervical back: Neck supple.  Neurological:     General: No focal deficit present.     Mental Status: He is alert.  Psychiatric:        Mood and Affect: Mood normal.        Behavior: Behavior normal.     ------------------------------------------------------------------------------------------------------------------------------------------------------------------------------------------------------------------- Assessment and  Plan  Bronchopneumonia Some improvement with previous antibiotics but has not fully resolved.  Will send to the 7-day course of Levaquin with an additional course of prednisone .  Chronic bilateral low back pain with bilateral sciatica Referral placed to physical therapy.  Will proceed with MRI if not improving.   Meds ordered this encounter  Medications   levofloxacin (LEVAQUIN) 750 MG tablet    Sig: Take 1 tablet (750 mg total) by mouth daily.    Dispense:  7 tablet    Refill:  0   predniSONE  (DELTASONE ) 20 MG tablet    Sig: Take 1 tablet (20 mg total) by mouth 2 (two) times daily with a meal for 5 days.    Dispense:  10 tablet    Refill:  0    No follow-ups on file.

## 2023-07-15 NOTE — Assessment & Plan Note (Signed)
 Some improvement with previous antibiotics but has not fully resolved.  Will send to the 7-day course of Levaquin  with an additional course of prednisone .

## 2023-07-15 NOTE — Assessment & Plan Note (Signed)
 Referral placed to physical therapy.  Will proceed with MRI if not improving.

## 2023-07-21 ENCOUNTER — Other Ambulatory Visit: Payer: Self-pay

## 2023-07-21 MED ORDER — ALBUTEROL SULFATE HFA 108 (90 BASE) MCG/ACT IN AERS: 2.0000 | INHALATION_SPRAY | Freq: Four times a day (QID) | RESPIRATORY_TRACT | 3 refills | Status: DC | PRN

## 2023-07-28 ENCOUNTER — Ambulatory Visit: Admitting: Physical Therapy

## 2023-07-29 ENCOUNTER — Telehealth: Payer: Self-pay | Admitting: Family Medicine

## 2023-07-29 ENCOUNTER — Other Ambulatory Visit: Payer: Self-pay | Admitting: Family Medicine

## 2023-07-29 NOTE — Telephone Encounter (Signed)
 Form completed to hold Plavix  5 days prior.  FOrm given to Burdette Carolin to fax.

## 2023-08-06 ENCOUNTER — Ambulatory Visit: Attending: Family Medicine | Admitting: Rehabilitative and Restorative Service Providers"

## 2023-09-07 ENCOUNTER — Encounter: Payer: Self-pay | Admitting: Family Medicine

## 2023-09-07 ENCOUNTER — Ambulatory Visit (INDEPENDENT_AMBULATORY_CARE_PROVIDER_SITE_OTHER): Admitting: Family Medicine

## 2023-09-07 VITALS — BP 148/78 | HR 81 | Ht 65.0 in | Wt 148.0 lb

## 2023-09-07 DIAGNOSIS — Z930 Tracheostomy status: Secondary | ICD-10-CM

## 2023-09-07 NOTE — Progress Notes (Signed)
 Shane Carrillo - 78 y.o. male MRN [id number]  Date of birth: 1945/10/28  Subjective Chief Complaint  Patient presents with   Follow-up    HPI Shane Carrillo is a 78 y.o. male here today for follow up visit.  Recently had surgery for construction of tracheoesophageal fistula.  Since his surgery he has seen speech therapy and was fitted for a larybutton.  He reports that he is nearly out of all tracheostomy supplies.  He does not currently have a larybutton.  He has not contacted his ENT provider and reports that DME company suggested he see his PCP.  ROS:  A comprehensive ROS was completed and negative except as noted per HPI  Allergies  Allergen Reactions   Atorvastatin  Itching   Sildenafil  Palpitations   Simvastatin Itching   Aspirin  Other (See Comments)    GI upset - Pt states that he is able to take the coated form     Past Medical History:  Diagnosis Date   Abdominal aortic aneurysm (AAA) 3.0 cm to 5.5 cm in diameter in male [facility]) 08/28/2016   Formatting of this note might be different from the original.  08/28/2016 AAA screening US  with 2.9 cm enlargement, repeat in 5 yrs, pt encouraged to stop smoking & to keep HTN controlled  Formatting of this note might be different from the original.  Overview:   08/28/2016 AAA screening US  with 2.9 cm enlargement, repeat in 5 yrs, pt encouraged to stop smoking & to keep HTN controlled     Abdominal aortic ectasia (HCC) 08/28/2016   Formatting of this note might be different from the original. Overview: 08/28/2016 AAA screening US  with 2.9 cm enlargement, repeat in 5 yrs, pt encouraged to stop smoking & to keep HTN controlled     Abnormal TSH 11/02/2018   Acute coronary syndrome (HCC) 10/09/2018   Acute pain of right knee 01/02/2020   Adjustment disorder with depressed mood 03/26/2021   Allergic rhinitis 06/03/2019   Benign prostatic hyperplasia with urinary frequency 11/02/2018   Benign prostatic hyperplasia with weak urinary stream  02/28/2016   BPH with obstruction/lower urinary tract symptoms    Breast lesion 08/20/2022   CAD (coronary artery disease) 11/02/2018   Cigarette smoker 02/26/2018   Community acquired pneumonia 06/03/2021   COPD (chronic obstructive pulmonary disease) (HCC)    Depression, major, single episode, moderate (HCC) 08/20/2022   DOE (dyspnea on exertion)    spirometry 02/2018 normal, but needs full PFT's per Dr. Darlean 03/2018.  No bronchodilators indicated as of 03/2018.   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   Mass of skin of back 08/20/2022   Mid back pain 02/01/2019   Nicotine dependence, cigarettes, uncomplicated 02/28/2016   PAD (peripheral artery disease) (HCC)    Abd aortic athero   Peripheral arterial disease (HCC) 01/26/2019   Peripheral arterial disease     Radiculopathy of cervicothoracic region 09/13/2021   Swelling of lower leg 12/26/2021   Syncope 09/01/2022   Syncope and collapse 08/23/2022   Thoracic aortic aneurysm (HCC) 12/31/2021   Tracheostomy dependent (HCC) 09/13/2021  Past Surgical History:  Procedure Laterality Date   ABDOMINAL AORTOGRAM W/LOWER EXTREMITY N/A 02/24/2019   Procedure: ABDOMINAL AORTOGRAM W/LOWER EXTREMITY;  Surgeon: Court Dorn PARAS, MD;  Location: [facility] INVASIVE CV LAB;  Service: Cardiovascular;  Laterality: N/A;   CORONARY ANGIOPLASTY WITH STENT PLACEMENT     Dr. Lenon in [place], [place].   FOOT SURGERY     PERIPHERAL VASCULAR INTERVENTION Left 02/24/2019   Procedure: PERIPHERAL VASCULAR INTERVENTION;  Surgeon: Court Dorn PARAS, MD;  Location: [facility] INVASIVE CV LAB;  Service: Cardiovascular;  Laterality: Left;   ROTATOR CUFF REPAIR      Social History   Socioeconomic History   Marital status: Divorced    Spouse name: Not on file   Number of children: Not on file   Years of education: 12th grade   Highest education level: Not on file  Occupational History    Comment: Retired  Tobacco Use   Smoking status: Former    Current packs/day: 0.00    Average packs/day: 1.8 packs/day for 59.0 years (103.3 ttl pk-yrs)    Types: Cigarettes    Start date: 07/18/1961    Quit date: 07/18/2020    Years since quitting: 3.1   Smokeless tobacco: Never  Vaping Use   Vaping status: Never Used  Substance and Sexual Activity   Alcohol use: Not Currently   Drug use: No   Sexual activity: Not on file  Other Topics Concern   Not on file  Social History Narrative   Lives alone. He has 12 grown children. He enjoys going to the race track and the casino.   Social Drivers of Health   Financial Resource Strain: Medium Risk (08/21/2020)   Overall Financial Resource Strain (CARDIA)    Difficulty of Paying Living Expenses: Somewhat hard  Food Insecurity: Food Insecurity Present (06/03/2023)   [facility]   Hunger Vital Sign    Within the past 12 months, you worried that your food would run out before you got the money to buy more.: Sometimes true    Within the past 12 months, the food you bought just didn't last and you didn't have money to get more.: Sometimes true  Transportation Needs: Unknown (06/03/2023)   Received from [facility]   PRAPARE - Transportation    Lack of Transportation (Medical): Patient declined    Lack of Transportation (Non-Medical): No  Physical Activity: Insufficiently Active (08/21/2020)   Exercise Vital Sign    Days of Exercise per Week: 3 days    Minutes of Exercise per Session: 30 min  Stress: No Stress Concern Present (08/23/2022)   Received from [facility] - Occupational Stress Questionnaire    Feeling of Stress : Only a little  Social Connections: Unknown (06/17/2021)   Received from [facility]   Social Network    Social Network: Not on file    Family History  Problem Relation Age of Onset   Cancer - Cervical Mother    Colon cancer Mother    Colon cancer Father    Prostate cancer Father    Diabetes Maternal Grandmother     Health Maintenance  Topic Date Due   COVID-19 Vaccine (1) Never done   Hepatitis C Screening  Never done   Zoster Vaccines- Shingrix (1 of 2) Never done   Pneumococcal Vaccine: 50+ Years (2 of 2 - PPSV23, PCV20, or PCV21) 04/24/2016   DTaP/Tdap/Td (3 - Td or Tdap) 01/07/2020  Lung Cancer Screening  09/15/2023   INFLUENZA VACCINE  09/18/2023   Medicare Annual Wellness (AWV)  07/01/2024   Hepatitis B Vaccines  Aged Out   HPV VACCINES  Aged Out   Meningococcal B Vaccine  Aged Out   Fecal DNA (Cologuard)  Discontinued     ----------------------------------------------------------------------------------------------------------------------------------------------------------------------------------------------------------------- Physical Exam BP (!) 148/78 (BP Location: Left Arm, Patient Position: Sitting, Cuff Size: Normal)   Pulse 81   Ht 5' 5 (1.651 m)   Wt 148 lb (67.1 kg)   SpO2 98%   BMI 24.63 kg/m   Physical Exam Constitutional:      Appearance: Normal appearance.  Musculoskeletal:     Cervical back: Neck supple.  Neurological:     General: No focal deficit present.     Mental Status: He is alert.  Psychiatric:        Mood and Affect: Mood normal.        Behavior: Behavior normal.     ------------------------------------------------------------------------------------------------------------------------------------------------------------------------------------------------------------------- Assessment and Plan  Tracheostomy dependent [facility]) Recently a  construction of tracheoesophageal fistula with refitting of larybutton.  Discussed with him that I recommend he contact ENT for supplies if he has trouble getting this from them we can try to contact DME company as well.   No orders of the defined types were placed in this encounter.   No follow-ups on file.

## 2023-09-07 NOTE — Assessment & Plan Note (Signed)
 Recently a [employer] representative of tracheoesophageal fistula with refitting of larybutton.  Discussed with him that I recommend he contact ENT for supplies if he has trouble getting this from them we can try to contact DME company as well.

## 2023-10-13 ENCOUNTER — Ambulatory Visit: Admitting: Family Medicine

## 2023-10-21 ENCOUNTER — Ambulatory Visit

## 2023-10-21 ENCOUNTER — Ambulatory Visit (INDEPENDENT_AMBULATORY_CARE_PROVIDER_SITE_OTHER): Admitting: Family Medicine

## 2023-10-21 ENCOUNTER — Encounter: Payer: Self-pay | Admitting: Family Medicine

## 2023-10-21 VITALS — BP 146/84 | HR 86 | Ht 65.0 in | Wt 148.0 lb

## 2023-10-21 DIAGNOSIS — M5416 Radiculopathy, lumbar region: Secondary | ICD-10-CM | POA: Diagnosis not present

## 2023-10-21 DIAGNOSIS — R093 Abnormal sputum: Secondary | ICD-10-CM

## 2023-10-21 DIAGNOSIS — J441 Chronic obstructive pulmonary disease with (acute) exacerbation: Secondary | ICD-10-CM

## 2023-10-21 DIAGNOSIS — M545 Low back pain, unspecified: Secondary | ICD-10-CM

## 2023-10-21 MED ORDER — PREDNISONE 10 MG (48) PO TBPK
ORAL_TABLET | Freq: Every day | ORAL | 0 refills | Status: DC
Start: 2023-10-21 — End: 2023-12-21

## 2023-10-21 MED ORDER — TAMSULOSIN HCL 0.4 MG PO CAPS: 0.4000 mg | ORAL_CAPSULE | Freq: Every day | ORAL | 3 refills | Status: AC

## 2023-10-21 MED ORDER — AMOXICILLIN-POT CLAVULANATE 875-125 MG PO TABS
1.0000 | ORAL_TABLET | Freq: Two times a day (BID) | ORAL | 0 refills | Status: DC
Start: 2023-10-21 — End: 2023-12-21

## 2023-10-21 NOTE — Assessment & Plan Note (Signed)
 Continue home nebulizer treatments.  Adding augmentin  and prednisone  taper.  CXR ordered.  Red flags reviewed.  Reminded to keep finger out of tracheostomy.  Plan for prevnar 20 immunization at f/u in 4 weeks.

## 2023-10-21 NOTE — Progress Notes (Signed)
 Shane Carrillo - 78 y.o. male MRN [id number]  Date of birth: May 20, 1945  Subjective Chief Complaint  Patient presents with   Flank Pain    HPI Shane Carrillo is a 78 y.o. male here today with complaint of cough, congestion and increased sputum production.  History of COPD with recurrent exacerbations.  He continues to use his finger to plug his tracheostomy instead of speech button to talk.  Current symptoms started about 2 weeks ago and have continued to worsen.  He is using nebulizer machine with some improvement.  Denies fever or chills.  No blood in sputum.   He is also having low back pain on R side.  Radiation to R leg.  Denies weakness, numbness or tingling.  He has not had bowel or bladder dysfunction.    ROS:  A comprehensive ROS was completed and negative except as noted per HPI .   Allergies  Allergen Reactions   Atorvastatin  Itching   Sildenafil  Palpitations   Simvastatin Itching   Aspirin  Other (See Comments)    GI upset - Pt states that he is able to take the coated form     Past Medical History:  Diagnosis Date   Abdominal aortic aneurysm (AAA) 3.0 cm to 5.5 cm in diameter in male [facility]) 08/28/2016   Formatting of this note might be different from the original.  08/28/2016 AAA screening US  with 2.9 cm enlargement, repeat in 5 yrs, pt encouraged to stop smoking & to keep HTN controlled  Formatting of this note might be different from the original.  Overview:   08/28/2016 AAA screening US  with 2.9 cm enlargement, repeat in 5 yrs, pt encouraged to stop smoking & to keep HTN controlled     Abdominal aortic ectasia (HCC) 08/28/2016   Formatting of this note might be different from the original. Overview: 08/28/2016 AAA screening US  with 2.9 cm enlargement, repeat in 5 yrs, pt encouraged to stop smoking & to keep HTN controlled     Abnormal TSH 11/02/2018   Acute coronary syndrome (HCC) 10/09/2018   Acute pain of right knee 01/02/2020   Adjustment disorder with depressed  mood 03/26/2021   Allergic rhinitis 06/03/2019   Benign prostatic hyperplasia with urinary frequency 11/02/2018   Benign prostatic hyperplasia with weak urinary stream 02/28/2016   BPH with obstruction/lower urinary tract symptoms    Breast lesion 08/20/2022   CAD (coronary artery disease) 11/02/2018   Cigarette smoker 02/26/2018   Community acquired pneumonia 06/03/2021   COPD (chronic obstructive pulmonary disease) (HCC)    Depression, major, single episode, moderate (HCC) 08/20/2022   DOE (dyspnea on exertion)    spirometry 02/2018 normal, but needs full PFT's per Dr. Darlean 03/2018.  No bronchodilators indicated as of 03/2018.   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   Mass of skin of back 08/20/2022   Mid back pain 02/01/2019   Nicotine dependence, cigarettes, uncomplicated 02/28/2016   PAD (peripheral artery disease) (HCC)    Abd aortic athero   Peripheral arterial disease (HCC) 01/26/2019   Peripheral arterial disease     Radiculopathy of  cervicothoracic region 09/13/2021   Swelling of lower leg 12/26/2021   Syncope 09/01/2022   Syncope and collapse 08/23/2022   Thoracic aortic aneurysm (HCC) 12/31/2021   Tracheostomy dependent (HCC) 09/13/2021    Past Surgical History:  Procedure Laterality Date   ABDOMINAL AORTOGRAM W/LOWER EXTREMITY N/A 02/24/2019   Procedure: ABDOMINAL AORTOGRAM W/LOWER EXTREMITY;  Surgeon: Court Dorn PARAS, MD;  Location: [facility] INVASIVE CV LAB;  Service: Cardiovascular;  Laterality: N/A;   CORONARY ANGIOPLASTY WITH STENT PLACEMENT      Dr. Lenon in [place], [place].   FOOT SURGERY     PERIPHERAL VASCULAR INTERVENTION Left 02/24/2019   Procedure: PERIPHERAL VASCULAR INTERVENTION;  Surgeon: Court Dorn PARAS, MD;  Location: [facility] INVASIVE CV LAB;  Service: Cardiovascular;  Laterality: Left;   ROTATOR CUFF REPAIR      Social History   Socioeconomic History   Marital status: Divorced    Spouse name: Not on file   Number of children: Not on file   Years of education: 12th grade   Highest education level: Not on file  Occupational History    Comment: Retired  Tobacco Use   Smoking status: Former    Current packs/day: 0.00    Average packs/day: 1.8 packs/day for 59.0 years (103.3 ttl pk-yrs)    Types: Cigarettes    Start date: 07/18/1961    Quit date: 07/18/2020    Years since quitting: 3.2   Smokeless tobacco: Never  Vaping Use   Vaping status: Never Used  Substance and Sexual Activity   Alcohol use: Not Currently   Drug use: No   Sexual activity: Not on file  Other Topics Concern   Not on file  Social History Narrative   Lives alone. He has 12 grown children. He enjoys going [place] the race track and the casino.   Social Drivers of Health   Financial Resource Strain: Medium Risk (08/21/2020)   Overall Financial Resource Strain (CARDIA)    Difficulty of Paying Living Expenses: Somewhat hard  Food Insecurity: Food Insecurity Present (06/03/2023)   Received from [facility]   Hunger Vital Sign    Within the past 12 months, you worried that your food would run out before you got the money to buy more.: Sometimes true    Within the past 12 months, the food you bought just didn't last and you didn't have money to get more.: Sometimes true  Transportation Needs: Unknown (06/03/2023)   Received from [facility]   PRAPARE - Transportation    Lack of Transportation (Medical): Patient declined    Lack of Transportation (Non-Medical): No  Physical Activity: Insufficiently Active (08/21/2020)   Exercise Vital Sign    Days  of Exercise per Week: 3 days    Minutes of Exercise per Session: 30 min  Stress: No Stress Concern Present (08/23/2022)   Received from [facility] - Occupational Stress Questionnaire    Feeling of Stress : Only a little  Social Connections: Unknown (06/17/2021)   Received from [facility]   Social Network    Social Network: Not on file    Family History  Problem Relation Age of Onset   Cancer - Cervical Mother    Colon cancer Mother    Colon cancer Father    Prostate cancer Father    Diabetes Maternal Grandmother     Health Maintenance  Topic Date Due   COVID-19 Vaccine (1) Never done   Hepatitis C Screening  Never done  Pneumococcal Vaccine: 50+ Years (2 of 2 - PPSV23, PCV20, or PCV21) 04/24/2016   DTaP/Tdap/Td (3 - Td or Tdap) 01/07/2020   Lung Cancer Screening  09/15/2023   INFLUENZA VACCINE  Never done   Zoster Vaccines- Shingrix (1 of 2) 01/20/2024 (Originally 01/05/1966)   Medicare Annual Wellness (AWV)  07/01/2024   HPV VACCINES  Aged Out   Meningococcal B Vaccine  Aged Out   Fecal DNA (Cologuard)  Discontinued     ----------------------------------------------------------------------------------------------------------------------------------------------------------------------------------------------------------------- Physical Exam BP (!) 146/84 (BP Location: Left Arm, Patient Position: Sitting, Cuff Size: Normal)   Pulse 86   Ht 5' 5 (1.651 m)   Wt 148 lb (67.1 kg)   SpO2 99%   BMI 24.63 kg/m   Physical Exam Constitutional:      Appearance: Normal appearance.  Eyes:     General: No scleral icterus. Cardiovascular:     Rate and Rhythm: Normal rate and regular rhythm.  Pulmonary:     Effort: Pulmonary effort is normal.     Breath sounds: Wheezing present.  Neurological:     Mental Status: He is alert.  Psychiatric:        Mood and Affect: Mood normal.        Behavior: Behavior normal.      ------------------------------------------------------------------------------------------------------------------------------------------------------------------------------------------------------------------- Assessment and Plan  COPD exacerbation (HCC) Continue home nebulizer treatments.  Adding augmentin  and prednisone  taper.  CXR ordered.  Red flags reviewed.  Reminded to keep finger out of tracheostomy.  Plan for prevnar 20 immunization at f/u in 4 weeks.   Lumbar radiculitis Xrays of lumbar spine ordered.  Prednisone  taper.  Red flags reviewed.  Will add PT if not improving.     Meds ordered this encounter  Medications   amoxicillin -clavulanate (AUGMENTIN ) 875-125 MG tablet    Sig: Take 1 tablet by mouth 2 (two) times daily.    Dispense:  20 tablet    Refill:  0   predniSONE  (STERAPRED UNI-PAK 48 TAB) 10 MG (48) TBPK tablet    Sig: Take by mouth daily. 12-day taper pack, use as directed for taper    Dispense:  1 tablet    Refill:  0   tamsulosin  (FLOMAX ) 0.4 MG CAPS capsule    Sig: Take 1 capsule (0.4 mg total) by mouth daily.    Dispense:  30 capsule    Refill:  3    Return in about 4 weeks (around 11/18/2023) for COPD.

## 2023-10-21 NOTE — Assessment & Plan Note (Signed)
 Xrays of lumbar spine ordered.  Prednisone  taper.  Red flags reviewed.  Will add PT if not improving.

## 2023-11-19 ENCOUNTER — Encounter: Payer: Self-pay | Admitting: Family Medicine

## 2023-11-19 ENCOUNTER — Ambulatory Visit (INDEPENDENT_AMBULATORY_CARE_PROVIDER_SITE_OTHER): Admitting: Family Medicine

## 2023-11-19 VITALS — BP 156/91 | HR 79 | Ht 65.0 in | Wt 145.0 lb

## 2023-11-19 DIAGNOSIS — I10 Essential (primary) hypertension: Secondary | ICD-10-CM

## 2023-11-19 DIAGNOSIS — J410 Simple chronic bronchitis: Secondary | ICD-10-CM | POA: Diagnosis not present

## 2023-11-19 MED ORDER — AIRSUPRA 90-80 MCG/ACT IN AERO: 2.0000 | INHALATION_SPRAY | Freq: Four times a day (QID) | RESPIRATORY_TRACT | 11 refills | Status: DC | PRN

## 2023-11-19 NOTE — Progress Notes (Signed)
 Shane Carrillo - 78 y.o. male MRN [id number]  Date of birth: March 04, 1945  Subjective Chief Complaint  Patient presents with   Exam    HPI Shane Carrillo is a 78 year old male here today for follow-up visit.  Reports overall he is feeling better.  Last seen for COPD exacerbation.  He does continue on inhalers and/or nebulizer treatment as needed.  Denies increased sputum or mucus production.  Blood pressure is elevated today.  He does report taking his amlodipine  and losartan  daily.  Denies chest pain, headache or vision changes.  ROS:  A comprehensive ROS was completed and negative except as noted per HPI  Allergies  Allergen Reactions   Atorvastatin  Itching   Sildenafil  Palpitations   Simvastatin Itching   Aspirin  Other (See Comments)    GI upset - Pt states that he is able to take the coated form     Past Medical History:  Diagnosis Date   Abdominal aortic aneurysm (AAA) 3.0 cm to 5.5 cm in diameter in male 08/28/2016   Formatting of this note might be different from the original.  08/28/2016 AAA screening US  with 2.9 cm enlargement, repeat in 5 yrs, pt encouraged to stop smoking & to keep HTN controlled  Formatting of this note might be different from the original.  Overview:   08/28/2016 AAA screening US  with 2.9 cm enlargement, repeat in 5 yrs, pt encouraged to stop smoking & to keep HTN controlled     Abdominal aortic ectasia 08/28/2016   Formatting of this note might be different from the original. Overview: 08/28/2016 AAA screening US  with 2.9 cm enlargement, repeat in 5 yrs, pt encouraged to stop smoking & to keep HTN controlled     Abnormal TSH 11/02/2018   Acute coronary syndrome (HCC) 10/09/2018   Acute pain of right knee 01/02/2020   Adjustment disorder with depressed mood 03/26/2021   Allergic rhinitis 06/03/2019   Benign prostatic hyperplasia with urinary frequency 11/02/2018   Benign prostatic hyperplasia with weak urinary stream 02/28/2016   BPH with  obstruction/lower urinary tract symptoms    Breast lesion 08/20/2022   CAD (coronary artery disease) 11/02/2018   Cigarette smoker 02/26/2018   Community acquired pneumonia 06/03/2021   COPD (chronic obstructive pulmonary disease) (HCC)    Depression, major, single episode, moderate (HCC) 08/20/2022   DOE (dyspnea on exertion)    spirometry 02/2018 normal, but needs full PFT's per Dr. Darlean 03/2018.  No bronchodilators indicated as of 03/2018.   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   Mass of skin of back 08/20/2022   Mid back pain 02/01/2019   Nicotine dependence, cigarettes, uncomplicated 02/28/2016   PAD (peripheral artery disease)    Abd aortic athero   Peripheral arterial disease 01/26/2019   Peripheral arterial disease     Radiculopathy of cervicothoracic region 09/13/2021   Swelling of lower leg 12/26/2021   Syncope 09/01/2022   Syncope and collapse 08/23/2022   Thoracic aortic aneurysm 12/31/2021   Tracheostomy dependent (HCC) 09/13/2021    Past Surgical History:  Procedure Laterality Date   ABDOMINAL AORTOGRAM W/LOWER EXTREMITY N/A  02/24/2019   Procedure: ABDOMINAL AORTOGRAM W/LOWER EXTREMITY;  Surgeon: Court Dorn PARAS, MD;  Location: [facility] INVASIVE CV LAB;  Service: Cardiovascular;  Laterality: N/A;   CORONARY ANGIOPLASTY WITH STENT PLACEMENT     Dr. Lenon in [place], [place].   FOOT SURGERY     PERIPHERAL VASCULAR INTERVENTION Left 02/24/2019   Procedure: PERIPHERAL VASCULAR INTERVENTION;  Surgeon: Court Dorn PARAS, MD;  Location: [facility] INVASIVE CV LAB;   Service: Cardiovascular;  Laterality: Left;   ROTATOR CUFF REPAIR      Social History   Socioeconomic History   Marital status: Divorced    Spouse name: Not on file   Number of children: Not on file   Years of education: 12th grade   Highest education level: Not on file  Occupational History    Comment: Retired  Tobacco Use   Smoking status: Former    Current packs/day: 0.00    Average packs/day: 1.8 packs/day for 59.0 years (103.3 ttl pk-yrs)    Types: Cigarettes    Start date: 07/18/1961    Quit date: 07/18/2020    Years since quitting: 3.3   Smokeless tobacco: Never  Vaping Use   Vaping status: Never Used  Substance and Sexual Activity   Alcohol use: Not Currently   Drug use: No   Sexual activity: Not on file  Other Topics Concern   Not on file  Social History Narrative   Lives alone. He has 12 grown children. He enjoys going to the race track and the casino.   Social Drivers of Health   Financial Resource Strain: Medium Risk (08/21/2020)   Overall Financial Resource Strain (CARDIA)    Difficulty of Paying Living Expenses: Somewhat hard  Food Insecurity: Food Insecurity Present (06/03/2023)   Received from [facility]   Hunger Vital Sign    Within the past 12 months, you worried that your food would run out before you got the money to buy more.: Sometimes true    Within the past 12 months, the food you bought just didn't last and you didn't have money to get more.: Sometimes true  Transportation Needs: Unknown (06/03/2023)   Received from [facility]   PRAPARE - Transportation    Lack of Transportation (Medical): Patient declined    Lack of Transportation (Non-Medical): No  Physical Activity: Insufficiently Active (08/21/2020)   Exercise Vital Sign    Days of Exercise per Week: 3 days    Minutes of Exercise per Session: 30 min  Stress: No Stress Concern Present (08/23/2022)   Received from [facility] of Occupational Health - Occupational  Stress Questionnaire    Feeling of Stress : Only a little  Social Connections: Unknown (06/17/2021)   Received from [facility]   Social Network    Social Network: Not on file    Family History  Problem Relation Age of Onset   Cancer - Cervical Mother    Colon cancer Mother    Colon cancer Father    Prostate cancer Father    Diabetes Maternal Grandmother     Health Maintenance  Topic Date Due   Hepatitis C Screening  Never done   Mammogram  Never done   DTaP/Tdap/Td (3 - Td or Tdap) 01/07/2020   Lung Cancer Screening  09/15/2023   Zoster Vaccines- Shingrix (1 of 2) 01/20/2024 (Originally 01/05/1966)   Influenza Vaccine  05/17/2024 (Originally 09/18/2023)   Pneumococcal Vaccine: 50+ Years (2 of 2 - PPSV23, PCV20, or PCV21) 11/18/2024 (Originally  04/24/2016)   COVID-19 Vaccine (1) 12/04/2024 (Originally 01/06/1952)   Medicare Annual Wellness (AWV)  07/01/2024   Meningococcal B Vaccine  Aged Out   Fecal DNA (Cologuard)  Discontinued     ----------------------------------------------------------------------------------------------------------------------------------------------------------------------------------------------------------------- Physical Exam BP (!) 156/91   Pulse 79   Ht 5' 5 (1.651 m)   Wt 145 lb (65.8 kg)   SpO2 98%   BMI 24.13 kg/m   Physical Exam Constitutional:      Appearance: Normal appearance.  Cardiovascular:     Rate and Rhythm: Normal rate and regular rhythm.  Pulmonary:     Effort: Pulmonary effort is normal.     Breath sounds: Normal breath sounds.  Neurological:     Mental Status: He is alert.  Psychiatric:        Mood and Affect: Mood normal.        Behavior: Behavior normal.     ------------------------------------------------------------------------------------------------------------------------------------------------------------------------------------------------------------------- Assessment and Plan  COPD (chronic  obstructive pulmonary disease) (HCC) Changing albuterol  to Airsupra.  If he continues to have exacerbations we may need to add something such as nebulized Pulmicort .  Essential hypertension Blood pressure is elevated today.  He has not taken medication yet today.  Return in 2 weeks for blood pressure recheck   Meds ordered this encounter  Medications   Albuterol -Budesonide  (AIRSUPRA) 90-80 MCG/ACT AERO    Sig: Inhale 2 puffs into the lungs every 6 (six) hours as needed.    Dispense:  1 g    Refill:  11    Return in about 3 months (around 02/19/2024) for COPD.

## 2023-11-22 ENCOUNTER — Encounter: Payer: Self-pay | Admitting: Family Medicine

## 2023-11-22 NOTE — Assessment & Plan Note (Signed)
 Blood pressure is elevated today.  He has not taken medication yet today.  Return in 2 weeks for blood pressure recheck

## 2023-11-22 NOTE — Assessment & Plan Note (Signed)
 Changing albuterol  to Airsupra.  If he continues to have exacerbations we may need to add something such as nebulized Pulmicort .

## 2023-11-26 ENCOUNTER — Other Ambulatory Visit (HOSPITAL_COMMUNITY): Payer: Self-pay

## 2023-11-26 ENCOUNTER — Telehealth: Payer: Self-pay | Admitting: Pharmacy Technician

## 2023-11-26 NOTE — Telephone Encounter (Signed)
 Pharmacy Patient Advocate Encounter  Received notification from [employer] that Prior Authorization for Airsupra 90-80MCG/ACT aerosol has been DENIED.  Full denial letter will be uploaded to the media tab. See denial reason below.   PA #/Case ID/Reference #: [id number]

## 2023-11-26 NOTE — Telephone Encounter (Signed)
 Pharmacy Patient Advocate Encounter   Received notification from [employer]base that prior authorization for Airsupra 90-80MCG/ACT aerosol is required/requested.   Insurance verification completed.   The patient is insured through [employer].   Per test claim: PA required; PA submitted to above mentioned insurance via Latent Key/confirmation #/EOC [id number] Status is pending

## 2023-12-01 ENCOUNTER — Ambulatory Visit

## 2023-12-03 ENCOUNTER — Ambulatory Visit (INDEPENDENT_AMBULATORY_CARE_PROVIDER_SITE_OTHER)

## 2023-12-03 ENCOUNTER — Telehealth: Payer: Self-pay

## 2023-12-03 VITALS — BP 148/82 | HR 100 | Resp 17 | Ht 65.0 in

## 2023-12-03 DIAGNOSIS — I10 Essential (primary) hypertension: Secondary | ICD-10-CM

## 2023-12-03 NOTE — Telephone Encounter (Signed)
 Patient was scheduled for  nurse visit yesterday  He stated while in office that he was suppose to have his urine checked at his last appt but he can not remember why.  In checking patient chart we could not find a note or order for this .  Patient was informed that we would discuss this with Dr. Alvia and reach out to him once he responds.   Also patient next appt is schedueld for 02/25/2023 but nurse visit shown elevated readings did you want the patient to return sooner for recheck and did you want to make any medication changes for him?

## 2023-12-03 NOTE — Progress Notes (Signed)
 Patient is in the office today for a BP recheck also states Dr. Donnice. Denies CP, SOB, or medication changes or issues.   First reading was 148/82

## 2023-12-03 NOTE — Patient Instructions (Signed)
 Return on 02/25/2024 for next follow up appt scheduled with Dr. Alvia.

## 2023-12-03 NOTE — Progress Notes (Signed)
   Established Patient Office Visit  Subjective   Patient ID: Shane Carrillo, male    DOB: 1946-01-02  Age: 78 y.o. MRN: [id number]  Hypertension. BP check nurse visit. Patient states that Dr. Alvia had also wanted to check his urine at his last visit. He was not sure what this was in regards to  and he was wondering if he could check his blood sugar in his urine when he checks this. Dr. Alvia not in office so had to let patient know that we would discuss this with him and give him a call once we speak with Dr. Alvia tomorrow.    HPI    ROS    Objective:     BP (!) 148/82   Pulse 100   Resp 17   Ht 5' 5 (1.651 m)   SpO2 98%   BMI 24.13 kg/m    Physical Exam   No results found for any visits on 12/03/23.    The ASCVD Risk score (Arnett DK, et al., 2019) failed to calculate for the following reasons:   Risk score cannot be calculated because patient has a medical history suggesting prior/existing ASCVD    Assessment & Plan:  BP check nurse visit. Initial reading = 148/82 and second reading - 142/90. He states he feels that he is normally in this range. Patient next appt is currently scheduled for 02/25/2024 with Dr. Alvia.  Problem List Items Addressed This Visit       Cardiovascular and Mediastinum   Essential hypertension - Primary (Chronic)    Return in about 12 weeks (around 02/25/2024) for HTN follow up with Dr. Alvia. SABRA Suzen SHAUNNA Alpheus, LPN

## 2023-12-04 ENCOUNTER — Ambulatory Visit

## 2023-12-04 NOTE — Telephone Encounter (Signed)
 Patient informed and scheduled for nurse visit for Monday 12/07/2023 for urianlysis check =kph

## 2023-12-04 NOTE — Telephone Encounter (Signed)
 Spoke with patient.  States he has had some urinary frequency  x 1 month and he states he has had an episode of abdominal pain about twice per month .

## 2023-12-07 ENCOUNTER — Ambulatory Visit (INDEPENDENT_AMBULATORY_CARE_PROVIDER_SITE_OTHER)

## 2023-12-07 DIAGNOSIS — R3915 Urgency of urination: Secondary | ICD-10-CM | POA: Diagnosis not present

## 2023-12-07 LAB — POCT URINALYSIS DIP (CLINITEK)
Blood, UA: NEGATIVE
Glucose, UA: NEGATIVE mg/dL
Ketones, POC UA: NEGATIVE mg/dL
Leukocytes, UA: NEGATIVE
Nitrite, UA: NEGATIVE
POC PROTEIN,UA: 30 — AB
Spec Grav, UA: >=1.03 — AB (ref 1.010–1.025)
Urobilinogen, UA: 0.2 U/dL
pH, UA: 6 (ref 5.0–8.0)

## 2023-12-07 NOTE — Progress Notes (Signed)
 Patient is in office today to give urine sample for a UA. Pt states he is having frequency and dysuria. Denies fever, chills, medications changes.  UA performed on results documented and reported to Dr. Alvia.

## 2023-12-21 ENCOUNTER — Encounter: Payer: Self-pay | Admitting: Family Medicine

## 2023-12-21 ENCOUNTER — Ambulatory Visit: Admitting: Family Medicine

## 2023-12-21 VITALS — BP 182/64 | HR 71 | Ht 65.0 in | Wt 144.0 lb

## 2023-12-21 DIAGNOSIS — J441 Chronic obstructive pulmonary disease with (acute) exacerbation: Secondary | ICD-10-CM

## 2023-12-21 MED ORDER — DOXYCYCLINE HYCLATE 100 MG PO TABS: 100.0000 mg | ORAL_TABLET | Freq: Two times a day (BID) | ORAL | 0 refills | Status: DC

## 2023-12-21 MED ORDER — PREDNISONE 20 MG PO TABS: 20.0000 mg | ORAL_TABLET | Freq: Two times a day (BID) | ORAL | 0 refills | Status: AC

## 2023-12-21 MED ORDER — ALBUTEROL SULFATE HFA 108 (90 BASE) MCG/ACT IN AERS: 2.0000 | INHALATION_SPRAY | Freq: Four times a day (QID) | RESPIRATORY_TRACT | 3 refills | Status: AC | PRN

## 2023-12-21 MED ORDER — BREZTRI AEROSPHERE 160-9-4.8 MCG/ACT IN AERO: 2.0000 | INHALATION_SPRAY | Freq: Two times a day (BID) | RESPIRATORY_TRACT | 11 refills | Status: AC

## 2023-12-21 NOTE — Assessment & Plan Note (Signed)
 Treating with course of doxycycline  and prednisone .  Adding Breztri  and albuterol  renewed.  Red flags reviewed.  Return if not improving.

## 2023-12-21 NOTE — Progress Notes (Signed)
 Shane Carrillo - 78 y.o. male MRN [id number]  Date of birth: 1945-03-29  Subjective Chief Complaint  Patient presents with   Mucus    HPI Shane Carrillo is a 78 y.o. male here today with complaint of increased mucus productions.  He has had increased sinus congestion with mucus production as well as coughing up more thick, yellow mucus.  He has been wheezing a little more short of breath.  He denies fever or chills.  He has not been using albuterol  inhaler regularly, currently out.  ROS:  A comprehensive ROS was completed and negative except as noted per HPI  Allergies  Allergen Reactions   Atorvastatin  Itching   Sildenafil  Palpitations   Simvastatin Itching   Aspirin  Other (See Comments)    GI upset - Pt states that he is able to take the coated form     Past Medical History:  Diagnosis Date   Abdominal aortic aneurysm (AAA) 3.0 cm to 5.5 cm in diameter in male 08/28/2016   Formatting of this note might be different from the original.  08/28/2016 AAA screening US  with 2.9 cm enlargement, repeat in 5 yrs, pt encouraged to stop smoking & to keep HTN controlled  Formatting of this note might be different from the original.  Overview:   08/28/2016 AAA screening US  with 2.9 cm enlargement, repeat in 5 yrs, pt encouraged to stop smoking & to keep HTN controlled     Abdominal aortic ectasia 08/28/2016   Formatting of this note might be different from the original. Overview: 08/28/2016 AAA screening US  with 2.9 cm enlargement, repeat in 5 yrs, pt encouraged to stop smoking & to keep HTN controlled     Abnormal TSH 11/02/2018   Acute coronary syndrome (HCC) 10/09/2018   Acute pain of right knee 01/02/2020   Adjustment disorder with depressed mood 03/26/2021   Allergic rhinitis 06/03/2019   Benign prostatic hyperplasia with urinary frequency 11/02/2018   Benign prostatic hyperplasia with weak urinary stream 02/28/2016   BPH with obstruction/lower urinary tract symptoms    Breast lesion  08/20/2022   CAD (coronary artery disease) 11/02/2018   Cigarette smoker 02/26/2018   Community acquired pneumonia 06/03/2021   COPD (chronic obstructive pulmonary disease) (HCC)    Depression, major, single episode, moderate (HCC) 08/20/2022   DOE (dyspnea on exertion)    spirometry 02/2018 normal, but needs full PFT's per Dr. Darlean 03/2018.  No bronchodilators indicated as of 03/2018.   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   Mass of skin of back 08/20/2022   Mid back pain 02/01/2019   Nicotine dependence, cigarettes, uncomplicated 02/28/2016   PAD (peripheral artery disease)    Abd aortic athero   Peripheral arterial disease 01/26/2019   Peripheral arterial disease     Radiculopathy of cervicothoracic region 09/13/2021   Swelling of lower leg 12/26/2021   Syncope 09/01/2022   Syncope and collapse 08/23/2022   Thoracic aortic aneurysm 12/31/2021   Tracheostomy dependent (HCC) 09/13/2021    Past Surgical History:  Procedure Laterality Date   ABDOMINAL AORTOGRAM W/LOWER EXTREMITY N/A 02/24/2019   Procedure:  ABDOMINAL AORTOGRAM W/LOWER EXTREMITY;  Surgeon: Court Dorn PARAS, MD;  Location: [facility] INVASIVE CV LAB;  Service: Cardiovascular;  Laterality: N/A;   CORONARY ANGIOPLASTY WITH STENT PLACEMENT     Dr. Lenon in [place], [place].   FOOT SURGERY     PERIPHERAL VASCULAR INTERVENTION Left 02/24/2019   Procedure: PERIPHERAL VASCULAR INTERVENTION;  Surgeon: Court Dorn PARAS, MD;  Location: [facility] INVASIVE CV LAB;  Service: Cardiovascular;  Laterality: Left;   ROTATOR CUFF  REPAIR      Social History   Socioeconomic History   Marital status: Divorced    Spouse name: Not on file   Number of children: Not on file   Years of education: 12th grade   Highest education level: Not on file  Occupational History    Comment: Retired  Tobacco Use   Smoking status: Former    Current packs/day: 0.00    Average packs/day: 1.8 packs/day for 59.0 years (103.3 ttl pk-yrs)    Types: Cigarettes    Start date: 07/18/1961    Quit date: 07/18/2020    Years since quitting: 3.4   Smokeless tobacco: Never  Vaping Use   Vaping status: Never Used  Substance and Sexual Activity   Alcohol use: Not Currently   Drug use: No   Sexual activity: Not on file  Other Topics Concern   Not on file  Social History Narrative   Lives alone. He has 12 grown children. He enjoys going to the race track and the casino.   Social Drivers of Health   Financial Resource Strain: Medium Risk (08/21/2020)   Overall Financial Resource Strain (CARDIA)    Difficulty of Paying Living Expenses: Somewhat hard  Food Insecurity: Food Insecurity Present (06/03/2023)   Received from [facility]   Hunger Vital Sign    Within the past 12 months, you worried that your food would run out before you got the money to buy more.: Sometimes true    Within the past 12 months, the food you bought just didn't last and you didn't have money to get more.: Sometimes true  Transportation Needs: Unknown (06/03/2023)   Received from [facility]   PRAPARE - Transportation    Lack of Transportation (Medical): Patient declined    Lack of Transportation (Non-Medical): No  Physical Activity: Insufficiently Active (08/21/2020)   Exercise Vital Sign    Days of Exercise per Week: 3 days    Minutes of Exercise per Session: 30 min  Stress: No Stress Concern Present (08/23/2022)   Received from [facility] of [facility]ational [facility] - Occupational Stress Questionnaire    Feeling of Stress : Only a little   Social Connections: Unknown (06/17/2021)   Received from [facility]   Social Network    Social Network: Not on file    Family History  Problem Relation Age of Onset   Cancer - Cervical Mother    Colon cancer Mother    Colon cancer Father    Prostate cancer Father    Diabetes Maternal Grandmother     Health Maintenance  Topic Date Due   Zoster Vaccines- Shingrix (1 of 2) 01/20/2024 (Originally 01/05/1966)   Influenza Vaccine  05/17/2024 (Originally 09/18/2023)   Pneumococcal Vaccine: 50+ Years (2 of 2 - PPSV23, PCV20, or PCV21) 11/18/2024 (Originally 04/24/2016)   COVID-19 Vaccine (1) 12/04/2024 (Originally 01[birth year]06/1952)   Lung Cancer Screening  12/20/2024 (Originally 09/15/2023)   DTaP/Tdap/Td (3 - Td or Tdap) 12/20/2024 (Originally 01/07/2020)   Hepatitis C Screening  12/20/2024 (Originally 01/05/1965)   Medicare Annual Wellness (AWV)  07/01/2024   Meningococcal B Vaccine  Aged Out   Fecal DNA (Cologuard)  Discontinued     ----------------------------------------------------------------------------------------------------------------------------------------------------------------------------------------------------------------- Physical Exam BP (!) 182/64   Pulse 71   Ht 5' 5 (1.651 m)   Wt 144 lb (65.3 kg)   SpO2 96%   BMI 23.96 kg/m   Physical Exam Constitutional:      Appearance: Normal appearance.  HENT:     Head: Normocephalic and atraumatic.  Eyes:     General: No scleral icterus. Cardiovascular:     Rate and Rhythm: Normal rate and regular rhythm.  Pulmonary:     Effort: Pulmonary effort is normal.     Breath sounds: Wheezing present.  Neurological:     Mental Status: He is alert.  Psychiatric:        Mood and Affect: Mood normal.        Behavior: Behavior normal.      ------------------------------------------------------------------------------------------------------------------------------------------------------------------------------------------------------------------- Assessment and Plan  COPD exacerbation (HCC) Treating with course of doxycycline  and prednisone .  Adding Breztri  and albuterol  renewed.  Red flags reviewed.  Return if not improving.   Meds ordered this encounter  Medications   albuterol  (VENTOLIN  HFA) 108 (90 Base) MCG/ACT inhaler    Sig: Inhale 2 puffs into the lungs every 6 (six) hours as needed for wheezing or shortness of breath.    Dispense:  8 g    Refill:  3   doxycycline  (VIBRA -TABS) 100 MG tablet    Sig: Take 1 tablet (100 mg total) by mouth 2 (two) times daily.    Dispense:  20 tablet    Refill:  0   predniSONE  (DELTASONE ) 20 MG tablet    Sig: Take 1 tablet (20 mg total) by mouth 2 (two) times daily with a meal for 5 days.    Dispense:  10 tablet    Refill:  0   budesonide -glycopyrrolate-formoterol  (BREZTRI  AEROSPHERE) 160-9-4.8 MCG/ACT AERO inhaler    Sig: Inhale 2 puffs into the lungs 2 (two) times daily.    Dispense:  10.7 g    Refill:  11    Return in about 4 weeks (around 01/18/2024) for Hypertension.

## 2023-12-29 ENCOUNTER — Other Ambulatory Visit (HOSPITAL_COMMUNITY): Payer: Self-pay

## 2023-12-29 ENCOUNTER — Telehealth: Payer: Self-pay | Admitting: Pharmacy Technician

## 2023-12-29 NOTE — Telephone Encounter (Signed)
 Pharmacy Patient Advocate Encounter   Received notification from Onbase that prior authorization for Breztri  Aerosphere 160-9-4.8 mcg/act inhaler is required/requested.   Insurance verification completed.   The patient is insured through [employer].   Per test claim:  Trelegy is preferred by the insurance.  If suggested medication is appropriate, Please send in a new RX and discontinue this one. If not, please advise as to why it's not appropriate so that we may request a Prior Authorization. Please note, some preferred medications may still require a PA.  If the suggested medications have not been trialed and there are no contraindications to their use, the PA will not be submitted, as it will not be approved.    CMM Key# [id number]

## 2023-12-30 ENCOUNTER — Other Ambulatory Visit (HOSPITAL_COMMUNITY): Payer: Self-pay

## 2023-12-30 NOTE — Telephone Encounter (Signed)
 Pharmacy Patient Advocate Encounter  Received notification from [facility] that Prior Authorization for  Breztri  Aerosphere 160-9-4.8 mcg/act inhaler has been APPROVED from 12/30/2023 to 02/16/2025. Ran test claim, Copay is $115.00. This test claim was processed through [facility]- copay amounts may vary at other pharmacies due to pharmacy/plan contracts, or as the patient moves through the different stages of their insurance plan.   PA #/Case ID/Reference #: [id number]

## 2023-12-31 ENCOUNTER — Other Ambulatory Visit (HOSPITAL_COMMUNITY): Payer: Self-pay

## 2024-02-25 ENCOUNTER — Encounter: Payer: Self-pay | Admitting: Family Medicine

## 2024-02-25 ENCOUNTER — Ambulatory Visit

## 2024-02-25 ENCOUNTER — Ambulatory Visit (INDEPENDENT_AMBULATORY_CARE_PROVIDER_SITE_OTHER): Admitting: Family Medicine

## 2024-02-25 VITALS — BP 115/78 | HR 89 | Ht 65.0 in | Wt 139.0 lb

## 2024-02-25 DIAGNOSIS — R0602 Shortness of breath: Secondary | ICD-10-CM

## 2024-02-25 DIAGNOSIS — R059 Cough, unspecified: Secondary | ICD-10-CM

## 2024-02-25 DIAGNOSIS — I10 Essential (primary) hypertension: Secondary | ICD-10-CM

## 2024-02-25 DIAGNOSIS — J209 Acute bronchitis, unspecified: Secondary | ICD-10-CM | POA: Diagnosis not present

## 2024-02-25 DIAGNOSIS — R062 Wheezing: Secondary | ICD-10-CM

## 2024-02-25 DIAGNOSIS — J440 Chronic obstructive pulmonary disease with acute lower respiratory infection: Secondary | ICD-10-CM | POA: Diagnosis not present

## 2024-02-25 DIAGNOSIS — R110 Nausea: Secondary | ICD-10-CM | POA: Insufficient documentation

## 2024-02-25 DIAGNOSIS — J441 Chronic obstructive pulmonary disease with (acute) exacerbation: Secondary | ICD-10-CM

## 2024-02-25 MED ORDER — PREDNISONE 20 MG PO TABS: 20.0000 mg | ORAL_TABLET | Freq: Two times a day (BID) | ORAL | 0 refills | Status: AC

## 2024-02-25 NOTE — Assessment & Plan Note (Signed)
BP is well controlled at this time.  Continue current medications.

## 2024-02-25 NOTE — Assessment & Plan Note (Signed)
 Treating prednisone  burst.  2 View CXR ordered.  Continue breztri  daily.  He will bring piece that is broken on his nebulizer to see if we can replace this.

## 2024-02-25 NOTE — Progress Notes (Signed)
 " RONTRELL MOQUIN - 79 y.o. male MRN [id number]  Date of birth: Oct 23, 1945  Subjective Chief Complaint  Patient presents with   COPD    HPI KORDE JEPPSEN is a 79 y.o. male here today for follow up visit.   He reports that he is doing ok. .   Seen by ENT in November.  Was having some vomiting at that time.  Some esophageal dysmotility noted on previous MBSS.  Rx for zofran  with plan for GI referral if not improving.  He continues to have nause and has lost weight due to decreased appetite.   He reports that he is using Breztri  regularly.  He is missing a piece to his nebulizer so has not been able to use his duoneb recently.  More mucus production from tracheostomy and coughing more.  He is feeling more short of breath.    ROS:  A comprehensive ROS was completed and negative except as noted per HPI  Allergies[1]  Past Medical History:  Diagnosis Date   Abdominal aortic aneurysm (AAA) 3.0 cm to 5.5 cm in diameter in male 08/28/2016   Formatting of this note might be different from the original.  08/28/2016 AAA screening US  with 2.9 cm enlargement, repeat in 5 yrs, pt encouraged to stop smoking & to keep HTN controlled  Formatting of this note might be different from the original.  Overview:   08/28/2016 AAA screening US  with 2.9 cm enlargement, repeat in 5 yrs, pt encouraged to stop smoking & to keep HTN controlled     Abdominal aortic ectasia 08/28/2016   Formatting of this note might be different from the original. Overview: 08/28/2016 AAA screening US  with 2.9 cm enlargement, repeat in 5 yrs, pt encouraged to stop smoking & to keep HTN controlled     Abnormal TSH 11/02/2018   Acute coronary syndrome (HCC) 10/09/2018   Acute pain of right knee 01/02/2020   Adjustment disorder with depressed mood 03/26/2021   Allergic rhinitis 06/03/2019   Benign prostatic hyperplasia with urinary frequency 11/02/2018   Benign prostatic hyperplasia with weak urinary stream 02/28/2016   BPH with  obstruction/lower urinary tract symptoms    Breast lesion 08/20/2022   CAD (coronary artery disease) 11/02/2018   Cigarette smoker 02/26/2018   Community acquired pneumonia 06/03/2021   COPD (chronic obstructive pulmonary disease) (HCC)    Depression, major, single episode, moderate (HCC) 08/20/2022   DOE (dyspnea on exertion)    spirometry 02/2018 normal, but needs full PFT's per Dr. Darlean 03/2018.  No bronchodilators indicated as of 03/2018.   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   Mass of skin of back 08/20/2022   Mid back pain 02/01/2019   Nicotine dependence, cigarettes, uncomplicated 02/28/2016   PAD (peripheral artery disease)    Abd aortic athero   Peripheral arterial disease 01/26/2019   Peripheral arterial disease     Radiculopathy of cervicothoracic region 09/13/2021   Swelling of lower leg 12/26/2021   Syncope 09/01/2022   Syncope and collapse 08/23/2022   Thoracic aortic aneurysm 12/31/2021   Tracheostomy dependent (HCC) 09/13/2021  Past Surgical History:  Procedure Laterality Date   ABDOMINAL AORTOGRAM W/LOWER EXTREMITY N/A 02/24/2019   Procedure: ABDOMINAL AORTOGRAM W/LOWER EXTREMITY;  Surgeon: Court Dorn PARAS, MD;  Location: [facility] INVASIVE CV LAB;  Service: Cardiovascular;  Laterality: N/A;   CORONARY ANGIOPLASTY WITH STENT PLACEMENT     Dr. Lenon in [place], [place].   FOOT SURGERY     PERIPHERAL VASCULAR INTERVENTION Left 02/24/2019   Procedure: PERIPHERAL VASCULAR INTERVENTION;  Surgeon: Court Dorn PARAS, MD;  Location: [facility] INVASIVE CV LAB;   Service: Cardiovascular;  Laterality: Left;   ROTATOR CUFF REPAIR      Social History   Socioeconomic History   Marital status: Divorced    Spouse name: Not on file   Number of children: Not on file   Years of education: 12th grade   Highest education level: Not on file  Occupational History    Comment: Retired  Tobacco Use   Smoking status: Former    Current packs/day: 0.00    Average packs/day: 1.8 packs/day for 59.0 years (103.3 ttl pk-yrs)    Types: Cigarettes    Start date: 07/18/1961    Quit date: 07/18/2020    Years since quitting: 3.6   Smokeless tobacco: Never  Vaping Use   Vaping status: Never Used  Substance and Sexual Activity   Alcohol use: Not Currently   Drug use: No   Sexual activity: Not on file  Other Topics Concern   Not on file  Social History Narrative   Lives alone. He has 12 grown children. He enjoys going to the race track and the casino.   Social Drivers of Health   Tobacco Use: Medium Risk (02/25/2024)   Patient History    Smoking Tobacco Use: Former    Smokeless Tobacco Use: Never    Passive Exposure: Not on file  Financial Resource Strain: Not on file  Food Insecurity: Food Insecurity Present (06/03/2023)   Received from [facility]   Epic    Within the past 12 months, you worried that your food would run out before you got the money to buy more.: Sometimes true    Within the past 12 months, the food you bought just didn't last and you didn't have money to get more.: Sometimes true  Transportation Needs: Unknown (06/03/2023)   Received from [facility]   PRAPARE - Transportation    Lack of Transportation (Medical): Patient declined    Lack of Transportation (Non-Medical): No  Physical Activity: Not on file  Stress: No Stress Concern Present (08/23/2022)   Received from [facility] of Occupational Health - Occupational Stress Questionnaire    Feeling of Stress : Only a little  Social Connections: Unknown  (06/17/2021)   Received from [facility]   Social Network    Social Network: Not on file  Depression [phone]): Medium Risk (08/20/2022)   Depression (PHQ2-9)    PHQ-2 Score: 8  Alcohol Screen: Not on file  Housing: Low Risk (12/21/2023)   Epic    Unable to Pay for Housing in the Last Year: No    Number of Times Moved in the Last Year: 0    Homeless in the Last Year: No  Utilities: Low Risk (06/03/2023)   Received from [facility]   Utilities    Within the past 12 months, have you been unable to get utilities(heat, electricity) when it was really needed?: No  Health Literacy: Adequate Health Literacy (11/19/2023)   B1300 Health Literacy  Frequency of need for help with medical instructions: Rarely    Family History  Problem Relation Age of Onset   Cancer - Cervical Mother    Colon cancer Mother    Colon cancer Father    Prostate cancer Father    Diabetes Maternal Grandmother     Health Maintenance  Topic Date Due   Influenza Vaccine  05/17/2024 (Originally 09/18/2023)   Zoster Vaccines- Shingrix (1 of 2) 05/25/2024 (Originally 01/05/1966)   Pneumococcal Vaccine: 50+ Years (2 of 2 - PPSV23, PCV20, or PCV21) 11/18/2024 (Originally 04/24/2016)   COVID-19 Vaccine (1) 12/04/2024 (Originally 07/06/1947)   Lung Cancer Screening  12/20/2024 (Originally 09/15/2023)   DTaP/Tdap/Td (3 - Td or Tdap) 12/20/2024 (Originally 01/07/2020)   Hepatitis C Screening  12/20/2024 (Originally 01/05/1965)   Medicare Annual Wellness (AWV)  07/01/2024   Meningococcal B Vaccine  Aged Out   Fecal DNA (Cologuard)  Discontinued     ----------------------------------------------------------------------------------------------------------------------------------------------------------------------------------------------------------------- Physical Exam BP 115/78 (BP Location: Left Arm, Patient Position: Sitting, Cuff Size: Normal)   Pulse 89   Ht 5' 5 (1.651 m)   Wt 139 lb (63 kg)   SpO2 99%   BMI  23.13 kg/m   Physical Exam Constitutional:      Appearance: Normal appearance.  Eyes:     General: No scleral icterus. Cardiovascular:     Rate and Rhythm: Normal rate and regular rhythm.  Pulmonary:     Effort: Pulmonary effort is normal.     Breath sounds: Wheezing and rhonchi present.  Musculoskeletal:     Cervical back: Neck supple.  Neurological:     Mental Status: He is alert.  Psychiatric:        Mood and Affect: Mood normal.        Behavior: Behavior normal.     ------------------------------------------------------------------------------------------------------------------------------------------------------------------------------------------------------------------- Assessment and Plan  COPD exacerbation (HCC) Treating prednisone  burst.  2 View CXR ordered.  Continue breztri  daily.  He will bring piece that is broken on his nebulizer to see if we can replace this.    Essential hypertension BP is well controlled at this time.  Continue current medications.   Chronic nausea Chronic nausea with some weight loss.  Referral placed to GI.     Meds ordered this encounter  Medications   predniSONE  (DELTASONE ) 20 MG tablet    Sig: Take 1 tablet (20 mg total) by mouth 2 (two) times daily with a meal for 5 days.    Dispense:  10 tablet    Refill:  0    No follow-ups on file.        [1]  Allergies Allergen Reactions   Atorvastatin  Itching   Sildenafil  Palpitations   Simvastatin Itching   Aspirin  Other (See Comments)    GI upset - Pt states that he is able to take the coated form    "

## 2024-02-25 NOTE — Assessment & Plan Note (Signed)
 Chronic nausea with some weight loss.  Referral placed to GI.

## 2024-02-26 ENCOUNTER — Ambulatory Visit: Payer: Self-pay | Admitting: Family Medicine

## 2024-02-26 MED ORDER — AMOXICILLIN-POT CLAVULANATE 875-125 MG PO TABS: 1.0000 | ORAL_TABLET | Freq: Two times a day (BID) | ORAL | 0 refills | Status: AC

## 2024-02-26 MED ORDER — AZITHROMYCIN 500 MG PO TABS: 500.0000 mg | ORAL_TABLET | Freq: Every day | ORAL | 0 refills | Status: AC

## 2024-07-07 ENCOUNTER — Ambulatory Visit
